# Patient Record
Sex: Female | Born: 1955
Health system: Southern US, Community
[De-identification: ages and names within clinical notes are randomized; demographics above are authoritative.]

## PROBLEM LIST (undated history)

## (undated) DIAGNOSIS — F419 Anxiety disorder, unspecified: Secondary | ICD-10-CM

## (undated) DIAGNOSIS — N12 Tubulo-interstitial nephritis, not specified as acute or chronic: Secondary | ICD-10-CM

## (undated) DIAGNOSIS — I509 Heart failure, unspecified: Secondary | ICD-10-CM

## (undated) DIAGNOSIS — R87619 Unspecified abnormal cytological findings in specimens from cervix uteri: Secondary | ICD-10-CM

## (undated) DIAGNOSIS — M5126 Other intervertebral disc displacement, lumbar region: Secondary | ICD-10-CM

## (undated) DIAGNOSIS — K219 Gastro-esophageal reflux disease without esophagitis: Secondary | ICD-10-CM

## (undated) DIAGNOSIS — I341 Nonrheumatic mitral (valve) prolapse: Secondary | ICD-10-CM

## (undated) DIAGNOSIS — M5136 Other intervertebral disc degeneration, lumbar region: Secondary | ICD-10-CM

## (undated) DIAGNOSIS — D649 Anemia, unspecified: Secondary | ICD-10-CM

## (undated) DIAGNOSIS — M4800 Spinal stenosis, site unspecified: Secondary | ICD-10-CM

## (undated) DIAGNOSIS — G4730 Sleep apnea, unspecified: Secondary | ICD-10-CM

## (undated) DIAGNOSIS — I10 Essential (primary) hypertension: Secondary | ICD-10-CM

## (undated) DIAGNOSIS — N289 Disorder of kidney and ureter, unspecified: Secondary | ICD-10-CM

## (undated) DIAGNOSIS — E739 Lactose intolerance, unspecified: Secondary | ICD-10-CM

## (undated) DIAGNOSIS — M51369 Other intervertebral disc degeneration, lumbar region without mention of lumbar back pain or lower extremity pain: Secondary | ICD-10-CM

## (undated) HISTORY — DX: Sleep apnea, unspecified: G47.30

## (undated) HISTORY — PX: CHOLECYSTECTOMY: SHX55

## (undated) HISTORY — DX: Anxiety disorder, unspecified: F41.9

## (undated) HISTORY — DX: Other intervertebral disc degeneration, lumbar region without mention of lumbar back pain or lower extremity pain: M51.369

## (undated) HISTORY — DX: Spinal stenosis, site unspecified: M48.00

## (undated) HISTORY — DX: Other intervertebral disc degeneration, lumbar region: M51.36

## (undated) HISTORY — PX: BREAST LUMPECTOMY: SHX2

## (undated) HISTORY — DX: Tubulo-interstitial nephritis, not specified as acute or chronic: N12

## (undated) HISTORY — DX: Anemia, unspecified: D64.9

## (undated) HISTORY — DX: Nonrheumatic mitral (valve) prolapse: I34.1

## (undated) HISTORY — DX: Lactose intolerance, unspecified: E73.9

## (undated) HISTORY — DX: Heart failure, unspecified: I50.9

## (undated) HISTORY — PX: DILATION AND CURETTAGE OF UTERUS: SHX78

## (undated) HISTORY — PX: BREAST BIOPSY: SHX20

## (undated) HISTORY — DX: Unspecified abnormal cytological findings in specimens from cervix uteri: R87.619

## (undated) HISTORY — DX: Gastro-esophageal reflux disease without esophagitis: K21.9

## (undated) HISTORY — PX: OTHER SURGICAL HISTORY: SHX169

## (undated) HISTORY — DX: Essential (primary) hypertension: I10

## (undated) HISTORY — DX: Other intervertebral disc displacement, lumbar region: M51.26

## (undated) HISTORY — PX: APPENDECTOMY: SHX54

---

## 1997-06-08 ENCOUNTER — Ambulatory Visit (HOSPITAL_COMMUNITY): Admission: RE | Admit: 1997-06-08 | Discharge: 1997-06-08 | Payer: Self-pay | Admitting: *Deleted

## 1998-04-25 ENCOUNTER — Ambulatory Visit (HOSPITAL_COMMUNITY): Admission: RE | Admit: 1998-04-25 | Discharge: 1998-04-25 | Payer: Self-pay | Admitting: *Deleted

## 1998-09-11 ENCOUNTER — Encounter (INDEPENDENT_AMBULATORY_CARE_PROVIDER_SITE_OTHER): Payer: Self-pay | Admitting: Specialist

## 1998-09-11 ENCOUNTER — Inpatient Hospital Stay (HOSPITAL_COMMUNITY): Admission: EM | Admit: 1998-09-11 | Discharge: 1998-09-13 | Payer: Self-pay | Admitting: Emergency Medicine

## 1998-11-26 ENCOUNTER — Encounter: Admission: RE | Admit: 1998-11-26 | Discharge: 1998-11-26 | Payer: Self-pay | Admitting: Gastroenterology

## 1998-11-26 ENCOUNTER — Encounter: Payer: Self-pay | Admitting: Gastroenterology

## 1999-02-27 ENCOUNTER — Encounter: Payer: Self-pay | Admitting: Emergency Medicine

## 1999-02-27 ENCOUNTER — Emergency Department (HOSPITAL_COMMUNITY): Admission: EM | Admit: 1999-02-27 | Discharge: 1999-02-27 | Payer: Self-pay | Admitting: Emergency Medicine

## 1999-03-06 ENCOUNTER — Encounter: Admission: RE | Admit: 1999-03-06 | Discharge: 1999-03-06 | Payer: Self-pay | Admitting: Gastroenterology

## 1999-03-06 ENCOUNTER — Encounter: Payer: Self-pay | Admitting: Gastroenterology

## 1999-03-07 ENCOUNTER — Ambulatory Visit (HOSPITAL_COMMUNITY): Admission: RE | Admit: 1999-03-07 | Discharge: 1999-03-07 | Payer: Self-pay | Admitting: Gastroenterology

## 1999-03-14 ENCOUNTER — Encounter: Payer: Self-pay | Admitting: Gastroenterology

## 1999-03-14 ENCOUNTER — Ambulatory Visit (HOSPITAL_COMMUNITY): Admission: RE | Admit: 1999-03-14 | Discharge: 1999-03-14 | Payer: Self-pay | Admitting: Gastroenterology

## 1999-03-17 ENCOUNTER — Other Ambulatory Visit: Admission: RE | Admit: 1999-03-17 | Discharge: 1999-03-17 | Payer: Self-pay | Admitting: *Deleted

## 1999-03-26 ENCOUNTER — Encounter: Payer: Self-pay | Admitting: *Deleted

## 1999-03-26 ENCOUNTER — Ambulatory Visit (HOSPITAL_COMMUNITY): Admission: RE | Admit: 1999-03-26 | Discharge: 1999-03-26 | Payer: Self-pay | Admitting: *Deleted

## 1999-04-25 ENCOUNTER — Encounter: Payer: Self-pay | Admitting: Internal Medicine

## 1999-04-25 ENCOUNTER — Ambulatory Visit (HOSPITAL_COMMUNITY): Admission: RE | Admit: 1999-04-25 | Discharge: 1999-04-25 | Payer: Self-pay | Admitting: Internal Medicine

## 1999-08-30 ENCOUNTER — Emergency Department (HOSPITAL_COMMUNITY): Admission: EM | Admit: 1999-08-30 | Discharge: 1999-08-30 | Payer: Self-pay | Admitting: Emergency Medicine

## 2000-04-23 ENCOUNTER — Other Ambulatory Visit: Admission: RE | Admit: 2000-04-23 | Discharge: 2000-04-23 | Payer: Self-pay | Admitting: *Deleted

## 2000-08-13 ENCOUNTER — Encounter: Payer: Self-pay | Admitting: *Deleted

## 2000-08-13 ENCOUNTER — Ambulatory Visit (HOSPITAL_COMMUNITY): Admission: RE | Admit: 2000-08-13 | Discharge: 2000-08-13 | Payer: Self-pay | Admitting: *Deleted

## 2001-09-27 ENCOUNTER — Inpatient Hospital Stay (HOSPITAL_COMMUNITY): Admission: EM | Admit: 2001-09-27 | Discharge: 2001-10-03 | Payer: Self-pay | Admitting: Emergency Medicine

## 2001-09-28 ENCOUNTER — Encounter: Payer: Self-pay | Admitting: Geriatric Medicine

## 2001-09-29 ENCOUNTER — Encounter: Payer: Self-pay | Admitting: Internal Medicine

## 2001-10-03 ENCOUNTER — Encounter: Payer: Self-pay | Admitting: Internal Medicine

## 2001-10-05 ENCOUNTER — Other Ambulatory Visit: Admission: RE | Admit: 2001-10-05 | Discharge: 2001-10-05 | Payer: Self-pay | Admitting: *Deleted

## 2001-10-06 ENCOUNTER — Encounter: Payer: Self-pay | Admitting: *Deleted

## 2001-10-06 ENCOUNTER — Ambulatory Visit (HOSPITAL_COMMUNITY): Admission: RE | Admit: 2001-10-06 | Discharge: 2001-10-06 | Payer: Self-pay | Admitting: *Deleted

## 2001-10-20 ENCOUNTER — Encounter: Payer: Self-pay | Admitting: General Surgery

## 2001-10-21 ENCOUNTER — Encounter: Payer: Self-pay | Admitting: General Surgery

## 2001-10-21 ENCOUNTER — Encounter (INDEPENDENT_AMBULATORY_CARE_PROVIDER_SITE_OTHER): Payer: Self-pay | Admitting: Specialist

## 2001-10-21 ENCOUNTER — Ambulatory Visit (HOSPITAL_COMMUNITY): Admission: RE | Admit: 2001-10-21 | Discharge: 2001-10-22 | Payer: Self-pay | Admitting: General Surgery

## 2002-04-28 ENCOUNTER — Encounter: Payer: Self-pay | Admitting: *Deleted

## 2002-04-28 ENCOUNTER — Ambulatory Visit (HOSPITAL_COMMUNITY): Admission: RE | Admit: 2002-04-28 | Discharge: 2002-04-28 | Payer: Self-pay | Admitting: *Deleted

## 2002-05-19 ENCOUNTER — Other Ambulatory Visit: Admission: RE | Admit: 2002-05-19 | Discharge: 2002-05-19 | Payer: Self-pay | Admitting: *Deleted

## 2002-08-07 ENCOUNTER — Ambulatory Visit (HOSPITAL_BASED_OUTPATIENT_CLINIC_OR_DEPARTMENT_OTHER): Admission: RE | Admit: 2002-08-07 | Discharge: 2002-08-07 | Payer: Self-pay | Admitting: Obstetrics and Gynecology

## 2002-10-27 ENCOUNTER — Emergency Department (HOSPITAL_COMMUNITY): Admission: EM | Admit: 2002-10-27 | Discharge: 2002-10-27 | Payer: Self-pay | Admitting: Emergency Medicine

## 2002-10-27 ENCOUNTER — Encounter: Payer: Self-pay | Admitting: Emergency Medicine

## 2002-10-30 ENCOUNTER — Ambulatory Visit (HOSPITAL_COMMUNITY): Admission: RE | Admit: 2002-10-30 | Discharge: 2002-10-31 | Payer: Self-pay | Admitting: Orthopedic Surgery

## 2003-10-25 ENCOUNTER — Ambulatory Visit (HOSPITAL_COMMUNITY): Admission: RE | Admit: 2003-10-25 | Discharge: 2003-10-25 | Payer: Self-pay | Admitting: *Deleted

## 2003-10-30 ENCOUNTER — Other Ambulatory Visit: Admission: RE | Admit: 2003-10-30 | Discharge: 2003-10-30 | Payer: Self-pay | Admitting: *Deleted

## 2003-12-11 ENCOUNTER — Ambulatory Visit (HOSPITAL_COMMUNITY): Admission: RE | Admit: 2003-12-11 | Discharge: 2003-12-11 | Payer: Self-pay | Admitting: Sports Medicine

## 2003-12-18 ENCOUNTER — Encounter: Admission: RE | Admit: 2003-12-18 | Discharge: 2004-01-25 | Payer: Self-pay | Admitting: Sports Medicine

## 2004-09-22 ENCOUNTER — Emergency Department (HOSPITAL_COMMUNITY): Admission: EM | Admit: 2004-09-22 | Discharge: 2004-09-22 | Payer: Self-pay | Admitting: Emergency Medicine

## 2004-11-14 ENCOUNTER — Other Ambulatory Visit: Admission: RE | Admit: 2004-11-14 | Discharge: 2004-11-14 | Payer: Self-pay | Admitting: Obstetrics and Gynecology

## 2005-12-01 ENCOUNTER — Ambulatory Visit (HOSPITAL_COMMUNITY): Admission: RE | Admit: 2005-12-01 | Discharge: 2005-12-01 | Payer: Self-pay | Admitting: Internal Medicine

## 2006-06-15 ENCOUNTER — Other Ambulatory Visit: Admission: RE | Admit: 2006-06-15 | Discharge: 2006-06-15 | Payer: Self-pay | Admitting: Obstetrics and Gynecology

## 2007-01-24 ENCOUNTER — Inpatient Hospital Stay (HOSPITAL_BASED_OUTPATIENT_CLINIC_OR_DEPARTMENT_OTHER): Admission: RE | Admit: 2007-01-24 | Discharge: 2007-01-24 | Payer: Self-pay | Admitting: Cardiology

## 2007-02-07 ENCOUNTER — Encounter (INDEPENDENT_AMBULATORY_CARE_PROVIDER_SITE_OTHER): Payer: Self-pay | Admitting: Cardiology

## 2007-02-07 ENCOUNTER — Ambulatory Visit: Payer: Self-pay | Admitting: Vascular Surgery

## 2007-02-07 ENCOUNTER — Ambulatory Visit (HOSPITAL_COMMUNITY): Admission: RE | Admit: 2007-02-07 | Discharge: 2007-02-07 | Payer: Self-pay | Admitting: Cardiology

## 2007-02-28 ENCOUNTER — Emergency Department (HOSPITAL_COMMUNITY): Admission: EM | Admit: 2007-02-28 | Discharge: 2007-02-28 | Payer: Self-pay | Admitting: Emergency Medicine

## 2007-06-05 ENCOUNTER — Emergency Department (HOSPITAL_COMMUNITY): Admission: EM | Admit: 2007-06-05 | Discharge: 2007-06-05 | Payer: Self-pay | Admitting: Family Medicine

## 2007-07-05 ENCOUNTER — Other Ambulatory Visit: Admission: RE | Admit: 2007-07-05 | Discharge: 2007-07-05 | Payer: Self-pay | Admitting: Obstetrics & Gynecology

## 2007-07-06 ENCOUNTER — Ambulatory Visit (HOSPITAL_COMMUNITY): Admission: RE | Admit: 2007-07-06 | Discharge: 2007-07-06 | Payer: Self-pay | Admitting: Obstetrics & Gynecology

## 2007-08-16 ENCOUNTER — Ambulatory Visit (HOSPITAL_COMMUNITY): Admission: RE | Admit: 2007-08-16 | Discharge: 2007-08-16 | Payer: Self-pay | Admitting: Gastroenterology

## 2007-08-16 ENCOUNTER — Encounter (INDEPENDENT_AMBULATORY_CARE_PROVIDER_SITE_OTHER): Payer: Self-pay | Admitting: Gastroenterology

## 2007-10-06 HISTORY — PX: OTHER SURGICAL HISTORY: SHX169

## 2007-10-14 ENCOUNTER — Inpatient Hospital Stay (HOSPITAL_COMMUNITY): Admission: EM | Admit: 2007-10-14 | Discharge: 2007-10-17 | Payer: Self-pay | Admitting: Emergency Medicine

## 2007-12-07 ENCOUNTER — Encounter: Admission: RE | Admit: 2007-12-07 | Discharge: 2008-01-02 | Payer: Self-pay | Admitting: Orthopaedic Surgery

## 2008-02-17 ENCOUNTER — Encounter: Admission: RE | Admit: 2008-02-17 | Discharge: 2008-04-09 | Payer: Self-pay | Admitting: Orthopaedic Surgery

## 2008-06-23 ENCOUNTER — Emergency Department (HOSPITAL_COMMUNITY): Admission: EM | Admit: 2008-06-23 | Discharge: 2008-06-23 | Payer: Self-pay | Admitting: Family Medicine

## 2008-08-03 ENCOUNTER — Ambulatory Visit (HOSPITAL_COMMUNITY): Admission: RE | Admit: 2008-08-03 | Discharge: 2008-08-03 | Payer: Self-pay | Admitting: Obstetrics & Gynecology

## 2008-10-05 HISTORY — PX: ANKLE SURGERY: SHX546

## 2009-03-14 ENCOUNTER — Ambulatory Visit: Payer: Self-pay | Admitting: Sports Medicine

## 2009-03-14 DIAGNOSIS — M79609 Pain in unspecified limb: Secondary | ICD-10-CM | POA: Insufficient documentation

## 2009-03-14 DIAGNOSIS — M25579 Pain in unspecified ankle and joints of unspecified foot: Secondary | ICD-10-CM | POA: Insufficient documentation

## 2009-03-15 DIAGNOSIS — R269 Unspecified abnormalities of gait and mobility: Secondary | ICD-10-CM | POA: Insufficient documentation

## 2009-09-24 ENCOUNTER — Ambulatory Visit (HOSPITAL_COMMUNITY): Admission: RE | Admit: 2009-09-24 | Discharge: 2009-09-24 | Payer: Self-pay | Admitting: Obstetrics & Gynecology

## 2010-02-01 ENCOUNTER — Emergency Department (HOSPITAL_COMMUNITY)
Admission: EM | Admit: 2010-02-01 | Discharge: 2010-02-01 | Payer: Self-pay | Source: Home / Self Care | Admitting: Emergency Medicine

## 2010-02-04 NOTE — Assessment & Plan Note (Signed)
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  History of Present Illness: Pt presents w/chronic b/l foot and ankle pain.  She has had surgery ont he right ankle after fracture and has also fractrued the left in the past.  She has been in orthotics for many years but is no longer getting relief from her old 3/4 length orthotics.  She is here today for evaluation orthotic consultation.  Allergies (verified): 1)  ! Axid 2)  ! Percocet 3)  ! Vicodin  Physical Exam  General:  Well-developed,well-nourished,in no acute distress; alert,appropriate and cooperative throughout examination Msk:  b/l bunions b/l transverse arch breakdown without metatarsal breakdown persistent effusion of the right ankle with surgical scar present Neurologic:  sensation intact Psych:  Cognition and judgment appear intact. Alert and cooperative with normal attention span and concentration   Impression & Recommendations:  Problem # 1:  FOOT PAIN, BILATERAL (ICD-729.5) Patient was fitted for a : standard, cushioned, semi-rigid orthotic. The orthotic was heated and afterward the patient stood on the orthotic blank positioned on the orthotic stand. The patient was positioned in subtalar neutral position and 10 degrees of ankle dorsiflexion in a weight bearing stance. After completion of molding, a stable base was applied to the orthotic blank. The blank was ground to a stable position for weight bearing. Size: 12 Base: large Posting: 1st ray post b/l Additional orthotic padding:  small 1/4 cm heel padding added to base of right   Problem # 2:  ANKLE PAIN, BILATERAL (ICD-719.47) likely associated with the foot problems, see orthotic prep above  Appended Document: NP,ORTHOTICS,[employer]  EMPLOYEE,[employer] Added charges. CRR  Gait Disturbance:  Treated with custom orthotics today to correct abnormal gait and to prevent further breakdown of transverse and long arches. CRR

## 2010-02-05 ENCOUNTER — Encounter (HOSPITAL_COMMUNITY)
Admission: RE | Admit: 2010-02-05 | Discharge: 2010-02-05 | Disposition: A | Discharge status: Home / Self Care | Payer: 59 | Source: Ambulatory Visit | Attending: Orthopaedic Surgery | Admitting: Orthopaedic Surgery

## 2010-02-05 ENCOUNTER — Ambulatory Visit (HOSPITAL_COMMUNITY)
Admission: RE | Admit: 2010-02-05 | Discharge: 2010-02-05 | Disposition: A | Discharge status: Home / Self Care | Payer: 59 | Attending: Orthopaedic Surgery | Admitting: Orthopaedic Surgery

## 2010-02-05 ENCOUNTER — Other Ambulatory Visit (HOSPITAL_COMMUNITY): Payer: Self-pay | Admitting: Orthopaedic Surgery

## 2010-02-05 DIAGNOSIS — M12579 Traumatic arthropathy, unspecified ankle and foot: Secondary | ICD-10-CM

## 2010-02-05 DIAGNOSIS — Z01810 Encounter for preprocedural cardiovascular examination: Secondary | ICD-10-CM | POA: Insufficient documentation

## 2010-02-05 DIAGNOSIS — Z01818 Encounter for other preprocedural examination: Secondary | ICD-10-CM | POA: Insufficient documentation

## 2010-02-05 DIAGNOSIS — M25579 Pain in unspecified ankle and joints of unspecified foot: Secondary | ICD-10-CM | POA: Insufficient documentation

## 2010-02-05 DIAGNOSIS — Z472 Encounter for removal of internal fixation device: Secondary | ICD-10-CM | POA: Insufficient documentation

## 2010-02-05 HISTORY — PX: ANKLE SURGERY: SHX546

## 2010-02-05 LAB — COMPREHENSIVE METABOLIC PANEL
AST: 25 U/L (ref 0–37)
BUN: 15 mg/dL (ref 6–23)
Calcium: 9.5 mg/dL (ref 8.4–10.5)
Chloride: 102 mEq/L (ref 96–112)
GFR calc non Af Amer: 51 mL/min — ABNORMAL LOW (ref >60)
Glucose, Bld: 87 mg/dL (ref 70–99)
Total Protein: 7.6 g/dL (ref 6.0–8.3)

## 2010-02-05 LAB — CBC
MCHC: 33.8 g/dL (ref 30.0–36.0)
RBC: 4.74 MIL/uL (ref 3.87–5.11)

## 2010-02-05 LAB — SURGICAL PCR SCREEN: Staphylococcus aureus: POSITIVE — AB

## 2010-02-05 LAB — DIFFERENTIAL
Basophils Relative: 1 % (ref 0–1)
Eosinophils Absolute: 0.3 10*3/uL (ref 0.0–0.7)
Eosinophils Relative: 3 % (ref 0–5)
Lymphocytes Relative: 27 % (ref 12–46)
Lymphs Abs: 2.9 10*3/uL (ref 0.7–4.0)

## 2010-02-18 NOTE — Op Note (Signed)
NAMEJELANI, [name]            ACCOUNT NO.:  [id number]  MEDICAL RECORD NO.:  [id number]           PATIENT TYPE:  O  LOCATION:  XRAY                         FACILITY:  [facility]  PHYSICIAN:  Bonnie Shimer C. Ophelia Sampson, M.D.    DATE OF BIRTH:  1955/10/09  DATE OF PROCEDURE:  02/05/2010 DATE OF DISCHARGE:                              OPERATIVE REPORT   PREOPERATIVE DIAGNOSIS:  Right ankle pain status post right ankle bimalleolar fracture with lateral peroneal tendon pain.  POSTOPERATIVE DIAGNOSIS:  Right ankle pain status post right ankle bimalleolar fracture with lateral peroneal tendon pain.  PROCEDURE:  Diagnostic operative arthroscopy right ankle, partial synovectomy, and removal of medial and lateral malleolar hardware.  SURGEON:  Bonnie Heist C. Ophelia Charter, MD  ANESTHESIA:  GOT plus Marcaine local.  TOURNIQUET TIME:  34 minutes.  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  She underwent ORIF of the ankle, which has healed. She has had persistent pain in the ankle with some swelling and pain over the lateral fibula and peroneal tendon adjacent to the plate, which was directly lateral.  PROCEDURE:  After adequate induction of general anesthesia and orotracheal intubation, calf leg holder, preoperative 2 g Ancef, standard prep and drape was performed.  An ankle scope was introduced with stab incision, spreading with mosquito infiltration into the joint for insufflation and then poking through the joint with blunt trocar. Synovectomy was performed anteriorly for visualization.  Cartilage surface looked normal.  There were some areas that were roughened up slightly on the anterior aspect of the tibia an, no corresponding significant spur anteriorly causing anterior ankle impingement. Anterolaterally, there were some localized synovitis, which was debrided.  Care was taken not to remove  the anterior talofibular ligament portion of the capsule.  Medial and lateral gutters were clear of loose bodies.  Probe was used for careful probing.  No loose pieces were noted and the scope was able to visualize fairly deep into the joint posteriorly.  After thorough irrigation, nylon sutures were placed in the portals.  Tourniquet was inflated, lateral incision was made, and fibular plate screws were removed including the anterior to posterior to proximal and distal interfrag screw.  Fracture was healed and stable. Wound was irrigated and then small incision opening half the old medial incision was made and two medial malleolar screws were removed with some difficulty since one of them had some bone overgrowth over the screw head.  This was irrigated and closed, saphenous vein was preserved. Inspection laterally over the peroneal tendon flexion/extension showed no evidence of peroneal subluxation of the tip.  There were some scar tissue around the peroneal tendon, but it was difficult to determine if there was any tendinopathy since the patient had previous trauma.  Scar tissue was present in the area from the scar tissue form of fracture hematoma post surgery of the fibula, etc.  After irrigation, 2-0 Vicryl was used for reapproximation skin closure in all areas and then postop dressing and a soft dressing.  The patient was transferred to the recovery room  in a stable condition.  Instrument count and needle count was correct.     [name], M.D.     MCY/MEDQ  D:  02/05/2010  T:  02/06/2010  Job:  [id number]  Electronically Signed by Annell Greening M.D. on 02/18/2010 04:56:29 PM

## 2010-02-20 ENCOUNTER — Ambulatory Visit: Payer: 59 | Attending: Orthopaedic Surgery | Admitting: Physical Therapy

## 2010-02-20 DIAGNOSIS — M25569 Pain in unspecified knee: Secondary | ICD-10-CM | POA: Insufficient documentation

## 2010-02-20 DIAGNOSIS — M2560 Stiffness of unspecified joint, not elsewhere classified: Secondary | ICD-10-CM | POA: Insufficient documentation

## 2010-02-20 DIAGNOSIS — IMO0001 Reserved for inherently not codable concepts without codable children: Secondary | ICD-10-CM | POA: Insufficient documentation

## 2010-02-20 DIAGNOSIS — R262 Difficulty in walking, not elsewhere classified: Secondary | ICD-10-CM | POA: Insufficient documentation

## 2010-02-24 ENCOUNTER — Encounter: Payer: 59 | Admitting: Physical Therapy

## 2010-02-25 ENCOUNTER — Ambulatory Visit: Payer: 59 | Admitting: Physical Therapy

## 2010-02-27 ENCOUNTER — Ambulatory Visit: Payer: 59 | Admitting: Physical Therapy

## 2010-03-03 ENCOUNTER — Ambulatory Visit: Payer: 59 | Admitting: Physical Therapy

## 2010-03-06 ENCOUNTER — Ambulatory Visit: Payer: 59 | Attending: Internal Medicine | Admitting: Physical Therapy

## 2010-03-06 DIAGNOSIS — R262 Difficulty in walking, not elsewhere classified: Secondary | ICD-10-CM | POA: Insufficient documentation

## 2010-03-06 DIAGNOSIS — M25569 Pain in unspecified knee: Secondary | ICD-10-CM | POA: Insufficient documentation

## 2010-03-06 DIAGNOSIS — IMO0001 Reserved for inherently not codable concepts without codable children: Secondary | ICD-10-CM | POA: Insufficient documentation

## 2010-03-06 DIAGNOSIS — M2560 Stiffness of unspecified joint, not elsewhere classified: Secondary | ICD-10-CM | POA: Insufficient documentation

## 2010-03-17 ENCOUNTER — Ambulatory Visit: Payer: 59 | Admitting: Physical Therapy

## 2010-03-19 ENCOUNTER — Ambulatory Visit: Payer: 59 | Admitting: Physical Therapy

## 2010-05-20 NOTE — Op Note (Signed)
[name], [name]            ACCOUNT NO.:  [id number]   MEDICAL RECORD NO.:  [id number]          PATIENT TYPE:  AMB   LOCATION:  ENDO                         [facility]ACILITY:  [facility]   PHYSICIAN:  James L. Malon Kindle., M.D.DATE OF BIRTH:  14-Jan-1955   DATE OF PROCEDURE:  08/16/2007  DATE OF DISCHARGE:                               OPERATIVE REPORT   PROCEDURE:  Esophagogastroduodenoscopy with biopsy.   MEDICATIONS:  Fentanyl 75 mcg, Versed 5 mg and Hurricaine spray.   INDICATIONS:  Persistent gastroesophageal reflux.   DESCRIPTION OF PROCEDURE:  The procedure had been explained to the  patient and consent obtained.  In the left lateral decubitus position  the Pentax upper scope was inserted and advanced.  The esophagus was  entered.  The distal esophagus was somewhat reddened, consistent with  esophageal reflux.  There was no ulceration, erosions or Barrett's  esophagus.  The stomach was entered, the pylorus identified and passed.  The duodenal bulb and second portion were seen well.  The antrum and  body of the stomach were normal,  then a 0.5-cm polyp in the proximal  body of the stomach was biopsied.  The fundus and cardia were seen well  on the retroflex view and were normal.  The scope was withdrawn and the  initial findings on entry were confirmed.   ASSESSMENT:  1. Esophageal reflux manifested by a somewhat reddened esophagus with      no signs of Barrett's esophagus.  2. Small gastric polyp biopsied, suspect fundic gland polyp.   PLAN:  1. Will continue on antireflux measures and continue on Nexium      indefinitely.  2. Will proceed with colonoscopy today.           ______________________________  Llana Aliment. Malon Kindle., M.D.     Waldron Session  D:  08/16/2007  T:  08/16/2007  Job:  [id number]   cc:   Candyce Churn, M.D.  Fax: [phone]   Leda Quail, MD   Llana Aliment. Malon Kindle., M.D.  Fax: [phone]

## 2010-05-20 NOTE — Op Note (Signed)
[name], [name]            ACCOUNT NO.:  [id number]   MEDICAL RECORD NO.:  [id number]          PATIENT TYPE:  INP   LOCATION:  5020                         FACILITY:  [facility]   PHYSICIAN:  Mark C. Ophelia Charter, M.D.    DATE OF BIRTH:  August 19, 1955   DATE OF PROCEDURE:  10/15/2007  DATE OF DISCHARGE:                               OPERATIVE REPORT   PREOPERATIVE DIAGNOSIS:  Right lateral malleolar ankle fracture  dislocation.  Left fibular fracture.   POSTOPERATIVE DIAGNOSIS:  Right lateral malleolar ankle fracture  dislocation.  Left fibular fracture.   PROCEDURE:  Open reduction and internal fixation right medial lateral  malleolar fracture.  Casting left lateral malleolar fracture.   SURGEON:  Mark C. Ophelia Charter, MD   TOURNIQUET:  Right lower extremity, 38 minutes x 350.   COMPONENTS USED:  Seven hole one-third tubular plate and two 4.0 medial  malleolar lag screws.   ANESTHESIA:  GOT plus Marcaine skin local 15 mL.   PROCEDURE:  After induction of general anesthesia, proximal tourniquet  application on the right, surgical checklist time-out procedure was  completed.  Leg was elevated during prepping and draping, tourniquet was  inflated.  Sterile skin marker was used.  Large glove was placed to  cover the toes.  Incision was made medially C-shaped and subperiosteal  dissection of the fracture.  There was no periosteum or posterior tib in  the fracture site and the medial malleolar fracture was little bit  smaller than normal and oblique.  The fibular fracture was extremely  comminuted and had 2 butterfly fragments and 7-hole one-third tubular  plate was selected.  Distal screw was placed cancellus and butterfly  fragments were reduced, clamped, and proximal bicortical 12-mm screws  were placed.  The inner frag screws placed between 2 large butterfly  fragments lagging anterior proximal to posterior distal 22-mm cortical.  AP and fluoroscopic pictures were checked including mortise  views  showing good reduction.  There were 2 empty screw holes in the 7-hole  one-third tubular plate, however, they went directly in the areas of  comminution.  After irrigation with saline solution, subcutaneous tissue  was reapproximated with 2-0 and skin staple was used for lateral  incision.  Medial malleolus was reduced, anatomically K-wire was drilled  across and then one single 40-mm lag screw cancellus partially threaded  was inserted followed by second in the position where the K-wire had  been placed.  Fluoroscopic pictures confirmed excellent position and  alignment.  After irrigation, 2-0 Vicryl in subcutaneous tissue, skin  staple closure for the medial incision.  Marcaine infiltration 15 mL  both incisions and also some in the fracture site.  Adaptic, 4x4s,  Webril, short-leg splint was applied.  Next, fiberglass short-leg cast  was  applied on the left side for the nondisplaced fibular fracture to  stabilize it since this will be the patient's better ankle and she will  be nonweightbearing due to the comminution of the right ankle fracture.  The patient tolerated the procedure well and was transferred to recovery  room.      Mark C. Ophelia Charter, M.D.  Electronically Signed     MCY/MEDQ  D:  10/15/2007  T:  10/15/2007  Job:  [id number]

## 2010-05-20 NOTE — Cardiovascular Report (Signed)
[name], [name]            ACCOUNT NO.:  [id number]   MEDICAL RECORD NO.:  [id number]          PATIENT TYPE:  OIB   LOCATION:  NA                           FACILITY:  [facility]   PHYSICIAN:  Jake Bathe, MD      DATE OF BIRTH:  August 26, 1955   DATE OF PROCEDURE:  01/24/2007  DATE OF DISCHARGE:                            CARDIAC CATHETERIZATION   PRIMARY CARE PHYSICIAN:  Candyce Churn, M.D.   INDICATIONS:  A 55 year old female with a strong early family history of  coronary artery disease with hypertension and symptoms concerning for  stable exertional angina.  Her mother had her first myocardial  infarction in her 7s.   PROCEDURES:  1. Left heart catheterization.  2. Left ventriculogram.  3. Selective coronary angiography.  4. Abdominal aortogram.   PROCEDURE DETAILS:  Informed consent was obtained.  Risks of stroke,  heart attack, and death were explained to the patient.  She was placed  on the catheterization table and prepped in a sterile fashion and then  1% lidocaine was used to infiltrate the right groin.  Prior to procedure  given her IV dye allergy, 3 doses of prednisone 60 mg as well as 2 doses  of Benadryl 25 mg were administrated.  Just prior to the case, 16 mg of  Solu-Medrol was administered IV.  Using the modified Seldinger  technique, a 4-French sheath was placed in the right femoral artery  after fluoroscopic visualization of the femoral head.  A Judkins left #4  catheter was then selectively cannulated in the left main artery and  multiple views with Omnipaque were obtained with hand injection.  This  catheter was then exchanged for a No Torque Williams right catheter,  which was selectively cannulated into the right coronary artery at 1  view, given its non-dominance, was obtained.  This catheter was  exchanged for an angled pigtail, which was used to cross the aortic  valve into the left ventricle.  Hemodynamics was obtained.  A left  ventriculogram in the RAO position with power injection was used with a  total of 25 mL of dye at 15 mL per second.  Following this shot, the  pigtail was pulled back across the aortic valve.  Hemodynamics were  obtained.  The pigtail was then brought to the level of L5 spinal body,  and abdominal aortogram in the AP position was obtained with power  injection of 40 mL of dye at 16 mL per second.  Following the procedure,  the catheter was removed.  The sheath was removed.  Manual compression  held.  The patient was hemodynamically stable.  No evidence of hematoma.  The patient tolerated the procedure well.  No evidence of hives or any  other allergic reaction.   FINDINGS:  1. Left main artery - No disease.  Short.  2. Left anterior descending artery - Two diagonal branches, first of      which has a very proximal takeoff.  There was no significant      disease within these vessels.  The LAD wraps around the apex and  continues in the intraventricular groove to the mid to basal      portion of the inferior wall.  Large vessel.  3. Left circumflex artery - Dominant vessel (actually codominant with      the LAD).  There were 3 obtuse marginal branches.  No significant      disease.  4. Right coronary artery - Small nondominant vessel.  5. Left ventriculogram - Normal ejection fraction estimated at 65%      with no regional wall motion abnormalities.  No significant MR or      mitral valve prolapse observed.  6. Hemodynamics - Left ventricle systolic pressure 145 with a left      ventricular end-diastolic pressure of 8 mmHg.  Aortic systolic      pressure 145 with a diastolic of 90 and a mean of 110 mmHg.  There      was no significant gradient between the left ventricle and aortic      root.  7. Abdominal aortogram - Bilateral renal arteries appear widely patent      with no significant disease.  There was no observable calcification      or plaquing in the abdominal aorta.    IMPRESSION:  1. No angiographically significant coronary artery disease.  2. Normal left ventricular ejection fraction estimated at 65% with no      regional wall motion abnormalities.  No observable mitral      regurgitation or mitral valve prolapse.  3. Normal-appearing renal arteries with no significant plaquing.      Abdominal aorta also with no significant plaquing or calcification.   RECOMMENDATIONS:  We will continue aggressive medical management,  especially with hypertension.  May need to add a titration of  medications.  Would pursue a noncardiac chest pain workup.  Last LDL was  130, HDL 40.      Jake Bathe, MD  Electronically Signed     MCS/MEDQ  D:  01/24/2007  T:  01/24/2007  Job:  [id number]   cc:   Candyce Churn, M.D.

## 2010-05-20 NOTE — Op Note (Signed)
[name], [name]            ACCOUNT NO.:  [id number]   MEDICAL RECORD NO.:  [id number]          PATIENT TYPE:  AMB   LOCATION:  ENDO                         FACILITY:  [facility]   PHYSICIAN:  James L. Malon Kindle., M.D.DATE OF BIRTH:  1955-06-14   DATE OF PROCEDURE:  08/16/2007  DATE OF DISCHARGE:                               OPERATIVE REPORT   PROCEDURE:  Colonoscopy.   MEDICATIONS:  The patient received a total of 125 mcg of Fentanyl and 7  mg of Versed for both procedures.   SCOPE:  Pentax pediatric colonoscope.   INDICATIONS FOR PROCEDURE:  Colon cancer screening.   DESCRIPTION OF PROCEDURE:  Following endoscopy, the patient was turned  around and digital examination was performed.  The Pentax pediatric  colonoscope was inserted and advanced.  The prep was excellent.  Using  some abdominal pressure, we reached the cecum.  The ileocecal valve and  appendiceal orifice identified.  The scope was withdrawn.  The cecum,  ascending, transverse, descending, and sigmoid colon was seen well.  No  diverticulosis.  No polyps.  No masses or other gross abnormalities.  The rectum was free of polyps.  Internal hemorrhoids were seen in the  rectum on retroflexed view.  The scope was withdrawn and the patient  tolerated the procedure well.   ASSESSMENT:  Normal screening colonoscopy other than the presence of  internal hemorrhoids.   PLAN:  I will recommend resuming stool hemoccults in 3 years and  repeating colonoscopy in 10 years.           ______________________________  Llana Aliment Malon Kindle., M.D.     Waldron Session  D:  08/16/2007  T:  08/16/2007  Job:  [id number]   cc:   Candyce Churn, M.D.  Fax: [phone]   M. Leda Quail, MD  Fax: [phone]

## 2010-05-23 NOTE — Discharge Summary (Signed)
[name], [name]                      ACCOUNT NO.:  [id number]   MEDICAL RECORD NO.:  [id number]                   PATIENT TYPE:  INP   LOCATION:  5743                                 FACILITY:  [facility]   PHYSICIAN:  Candyce Churn, MD            DATE OF BIRTH:  1955-07-05   DATE OF ADMISSION:  09/27/2001  DATE OF DISCHARGE:  10/03/2001                                 DISCHARGE SUMMARY   DISCHARGE DIAGNOSES:  1. Probable resolving pyelonephritis.  2. Abdominal pain with questionable etiology.  3. Iron deficiency anemia.  4. Gastroesophageal reflux disease.  5. Allergic rhinitis.  6. Chronic urticaria.  7. Abnormal  cervical changes noted on pelvic CT scan,  plans to be followed     up by Dr. Cheryle Horsfall.  8. Low grade fever and nausea while hospitalized, question secondary to     resolving pyelonephritis, improved on discharge.  9. History of lactose intolerance.  10.      History of positive antinuclear antibody.  11.      Transaminase, improved, question etiology.   DISCHARGE MEDICATIONS:  1. Zyrtec 10 mg  p.o. q.d.  2. DiaBeta HCT 1 cc.  3. Zestril 0.5 1 q.d.  4. Toprol XL 50 mg q.d.  5. Nexium 40 mg q.d.  6. ____________  100 mg p.o. b.i.d.  7. Levaquin 500 mg q.d. x 7 days.   CONSULTS:  1. Dr. Violeta Gelinas, surgery on September 30, 2001.  2. Dr. Dorena Cookey, gastroenterology on October 01, 2001.   HOSPITAL COURSE:  PROBLEM #1, FEVER, FLANK PAIN, PROBABLE PARTIALLY TREATED  PYELONEPHRITIS:  The patient presented to [facility] at on  September 27, 2001, complaining of fever and flank  pain and right CVA  tenderness, which is persistent after three days of ciprofloxacin for UTI.  She has been seen in the [place] walk-in clinic several days previous and was  found to have fever and an abnormal urinalysis. Unfortunately, her culture  from that clinic visit was possibly not processed and blood culture also was  not obtained. After taking  ciprofloxacin 500 mg b.i.d. and Pyridium t.i.d.  for two to three days, she presented complaining of worse CVA tenderness,  flank pain,  fever and  nausea and was admitted for IV fluids and IV  antibiotics.   Initial urinalysis on admission obtained on September 28, 2001, had large  hemoglobin, 3 to 6 white blood cells, 3 to 6 red cells and only rare  bacteria, apparently markedly different from a urinalysis obtained from the  [facility] walk-in clinic. Subsequent urine culture from September 28, 2001, also  revealed no growth. The patient was  treated with intravenous Rocephin on  admission, thinking that fluoroquinolones were causing nausea and vomiting.  Her white count initially was in the 12,000 range then it came down to  83,000 by September 29, 2001. Intravenous Rocephin was continued through  September 30, 2001, and then  discontinued because of persistent nausea and  vomiting.   By September 30, 2001, white count had stabilized at 9000, however, off  Rocephin. By October 03, 2001, white count had risen back from a minimum  of 8800 to 12,600 and it was elected to restart antibiotics in the form of  Levaquin 500 mg q.d. for 7 days to complete a 2 week course of antibiotics  for presumed pyelonephritis.   PROBLEM #2, PERSISTENT NAUSEA AND VOMITING AFTER ADMISSION: This was never  completely understood. The patient was having  some right upper quadrant  pain after admission, and an abdominal ultrasound revealed some bilateral  suprarenal cysts but was otherwise normal. A biliary scan performed on  September 29, 2001, showed a reduced ejection fraction of the gallbladder  20% to 25%, otherwise within normal limits.   Dr. Dorena Cookey was consulted on October 01, 2001, and thought that the low  grade fever and abdominal pain and the vomiting without elevation of LFTs  could possibly be secondary to viral or autoimmune hepatitis or possibly  inflammatory bowel disease, or from biliary  disease. An antibody screen for  hepatitis B and C were negative with a negative hepatitis B surface antigen  and negative hepatitis C antibody.  Smooth muscle antibody titers were  negative with less than 1 to 20,  suggesting a significant liver disease.   Of interest is that the patient did start to feel better, however, with  discontinuation of Rocephin, but her white count started to rise again. By  discharge on October 03, 2001. The patient was  feeling much improved and  was discharged home on Levaquin 500 mg for one more week. It was not fully  realized whether or not the nausea and vomiting and low grade fevers may  have been secondary to her medication or were part and parcel of her  pyelonephritis.  It was felt that she indeed have biliary dyskinesia and  that cholecystectomy might be indicated in her future. She wanted to work  with  Dr. Claud Kelp in terms of possible surgery.   An abdominal and pelvic CT scan was obtained by Dr. Madilyn Fireman on October 03, 2001, because of  her unexplained abdominal pain the mild low grade fever  and liver function tests and what was found was diffuse hepatocellular  disease just mild fat infiltration, bilateral  renal cysts, and chronic  pyelonephritic changes of the right kidney. This is likely secondary to  reflux nephropathy.   A pelvic scan revealed mild iliac adenopathy and rather striking changes of  the cervix with the radiologist thinking that it might be cervical carcinoma  with local invasion of surrounding structures, including the rectosigmoid  and potentially the bladder. Without a tissue diagnosis which certainly  continued to be suspect until proven otherwise. Copies of the CT scan were  to be forwarded to Dr. Cheryle Horsfall, also who is her gynecologist. Also  noted was a 2 cm hemorrhagic cyst on the left ovary.   An addendum was dictated to the abdominal and pelvic CT scan result when a subsequent MRI of the pelvis was  done on October 06, 2001. This was after the  hospital discharge. The cervix was thought to have a foreign body appearance  on the CT scan, but there was no evidence of cervical mass shown by MRI. A  cervical Nabothian cyst was seen measuring approximately 1 cm with stranding  noted in the perirectal region. The soft tissue density noted along  the left  iliac chain, although suspicious for adenopathy, was actually found to be a  left ovary with a 2 cm hemorrhagic cyst.  Of note the pelvic lymph nodes  were considered to be clinically significantly enlarged.   Again, at the time of discharge the patient was feeling improved, but the  reason  for the persistent nausea and abdominal pain was unclear. It could  have been both medication and infection related.  She was feeling much improved on discharge on October 03, 2001.                                                 Candyce Churn, MD    RNG/MEDQ  D:  11/01/2001  T:  11/02/2001  Job:  [id number]

## 2010-05-23 NOTE — Op Note (Signed)
[name], [name]                      ACCOUNT NO.:  [id number]   MEDICAL RECORD NO.:  [id number]                   PATIENT TYPE:  OIB   LOCATION:  5712                                 FACILITY:  [facility]   PHYSICIAN:  Angelia Mould. Derrell Lolling, M.D.             DATE OF BIRTH:  1955-11-10   DATE OF PROCEDURE:  10/21/2001  DATE OF DISCHARGE:  10/22/2001                                 OPERATIVE REPORT   PREOPERATIVE DIAGNOSIS:  Biliary dyskinesia.   POSTOPERATIVE DIAGNOSIS:  Biliary dyskinesia.   PROCEDURE:  Laparoscopic cholecystectomy with intraoperative cholangiogram.   SURGEON:  Angelia Mould. Derrell Lolling, M.D.   ASSISTANT:  Sandria Bales. Ezzard Standing, M.D.   OPERATIVE INDICATION:  This is a 55 year old black female who has had  problems with fatty food intolerance, postprandial abdominal bloating, right  upper quadrant epigastric pain, and nausea.  She has been worked up on more  than one occasion.  Gallbladder ultrasounds always show sludge.  Hepatobiliary scan recently showed a delayed ejection fraction of about 25%,  consistent with biliary kinesia.  She has had some mild elevation of her  liver function tests.  She has had an extensive GI workup over the past  couple of years.  Nothing else has been found other than a small hiatal  hernia.  She is brought to the operating room electively for  cholecystectomy.   OPERATIVE FINDINGS:  The gallbladder was fairly normal in appearance,  perhaps slightly thick-walled.  The cholangiogram was normal.  There were no  filling defects, the intrahepatic and extrahepatic biliary anatomy was  normal, and there was prompt flow of contrast into the duodenum.  The liver  was healthy.  The stomach and duodenum, small intestine and large intestine  were healthy.  The pelvis was visualized carefully, and everything looked  benign.  She had a cyst on her left ovary.  The right ovary looked normal.  The uterus was slightly enlarged.  The cul-de-sac looked normal.   The  sigmoid colon looked normal.   DESCRIPTION OF PROCEDURE:  Following the induction of general endotracheal  anesthesia, the patient's abdomen was prepped and draped in a sterile  fashion.  A transverse incision was made at the lower rim of the umbilicus,  excising the previous laparoscopy scar.  The fascia was incised in the  midline and the abdominal cavity entered under direct vision.  A 10 mm  Hasson trocar was inserted and secured with a pursestring suture of 0  Vicryl.  Pneumoperitoneum was created.  The video camera was inserted with  visualization and findings as described above.  A 10 mm trocar was placed in  the subxiphoid region and two 5 mm trocars placed in the right midabdomen.   The patient was placed in Trendelenburg position, and we carefully examined  the pelvis because of previous gynecologic problems.  She did not have any  significant pathologic findings, only a cyst on the left ovary  and a  slightly enlarged uterus.   The patient was then positioned with reverse Trendelenburg position and  tilted to the left.  We elevated the gallbladder and exposed the  gallbladder.  We dissected out the cystic duct and the cystic artery.  The  cystic artery was controlled with metal clips and divided as it went onto  the gallbladder wall.  The cystic duct was secured with a metal clip close  to the gallbladder.  The cystic duct was opened and a cholangiogram catheter  was inserted.  Cholangiogram was obtained using the C-arm.  The  cholangiogram was normal.  There was prompt emptying of contrast into the  duodenum, normal intrahepatic and extrahepatic biliary anatomy, and no  filling defects.  The cholangiogram catheter was removed.  The cystic duct  was secured with metal clips and divided.  The gallbladder was dissected  from its bed with electrocautery and removed through the umbilical port.  The operative field was irrigated.  There was absolutely no bleeding and no   bile leak whatsoever.  Trocars were removed under direct vision, and there  was no bleeding from the trocar sites.  The pneumoperitoneum was released.  The fascia at the umbilicus was closed with 0 Vicryl sutures.  The skin  incisions were closed with subcuticular sutures of 4-0 Vicryl and Steri-  Strips.  Clean bandages were placed and the patient taken to the recovery  room in stable condition.  Estimated blood loss was about 10 cc.  Complications:  None.  Sponge, needle, and instrument counts were correct.                                                Angelia Mould. Derrell Lolling, M.D.    HMI/MEDQ  D:  10/21/2001  T:  10/23/2001  Job:  [id number]   cc:   Candyce Churn, MD  [address] [place]  [place] [address]  Fax: [phone]   Andres Ege, M.D.  [address]  [place]  [place] [address]  Fax: [phone]

## 2010-05-23 NOTE — Op Note (Signed)
NAME:  [name]                      ACCOUNT NO.:  [id number]   MEDICAL RECORD NO.:  [id number]                   PATIENT TYPE:  OIB   LOCATION:  5715                                 FACILITY:  [facility]   PHYSICIAN:  Katy Fitch. Naaman Plummer., M.D.          DATE OF BIRTH:  [birth year]   DATE OF PROCEDURE:  10/30/2002  DATE OF DISCHARGE:                                 OPERATIVE REPORT   PREOPERATIVE DIAGNOSIS:  Comminuted articular fracture of the left distal  radius, impacted and dorsally angulated.   POSTOPERATIVE DIAGNOSIS:  Comminuted articular fracture of the left distal  radius, impacted and dorsally angulated.   PROCEDURE:  Open reduction and internal fixation of left distal radius.   SURGEON:  Katy Fitch. Sypher, M.D.   ASSISTANT:  [name], P.A.-C.   ANESTHESIA:  General by LMA, supervised by anesthesiologist, [name], M.D.   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   She fell onto an outstretched left arm sustaining an acute injury to the  left upper extremity.  She was transported to the [facility] Emergency Room where she was noted to have a comminuted impacted  fracture of her left distal radius.  An upper extremity orthopedic consult  was requested.   Clinical examination confirmed a silver fork deformity of her left wrist.  X-  rays revealed an impacted comminuted articular fracture with a significantly  displaced lunate facet fracture fragment.   Due to the fact that she had a full stomach we could not proceed with urgent  surgery at that time.   Rather in an effort to contain her costs and to provide optimal care we made  arrangements for elective ORIF of her fracture at [facility] utilizing a  DVR volar plate system.   Her surgery was scheduled for  Monday, October 30, 2002, on an elective  basis.   DESCRIPTION OF PROCEDURE:  After informed consent she is brought to the  operating room at this time.   [name] is brought to the operating room and placed in the supine  position on the operating room table.   Following induction of general orotracheal anesthesia, she was positioned  with her left arm on the arm table.  A pneumatic tourniquet was placed on  the proximal brachium.   After routine Betadine scrub and paint, the arm was draped with stockinette  and impervious arthroscopy drapes.  Following exsanguination of the left arm  with an Esmarch bandage and arterial tourniquet, the proximal brachium was  inflated to 230 mmHg.  The procedure commenced with a DVR volar incision  that had a Bruner's zigzag at the proximal wrist flexion crease.  The  incision paralleled the flexor carpal radialis tendon.  The subcutaneous  tissues were  carefully divided taking care to electrocauterize transverse  veins.  The flexor carpal radialis tendon sheath was incised longitudinally  and the tendon retracted in an ulnar direction.  The radial artery and its  accompanying veins retracted radially.  The flexor pollicis longus was  retracted in an ulnar direction and the pronator quadratus was taken sharply  and elevated with a periosteal elevator exposing the fracture site and  distal radial metaphysis.   A seven-hole DVT plate system was selected as the appropriate size for her  radius.  The fracture was reduced directly with three-point molding with  direct manipulation of the fracture fragments.  An anatomic reduction was  achieved with restoration of slope, length and tilt.  The seven-hold DVR  plate system was then placed through standard technique utilizing a sliding  cortical screw to adjust the length followed by placement of the seven pegs  and threaded screws using C-arm fluoroscopic control to be certain that both  the  fracture fragments remain anatomic and the pegs were in the appropriate  position.   The three remaining cortical screws were placed without difficulty.   AP, lateral and 30 degree supination obliques demonstrated anatomic  reduction and excellent position of the hardware.   The wound was then lavaged with sterile saline followed by triple antibiotic  solution.  The pronator quadratus was repaired with a running 2-0 Vicryl  suture followed by repair of the skin with subdermal sutures of 4-0 Vicryl  and intradermal 3-0 Prolene.   The wound was dressed with Steri-Strips, sterile gauze, sterile Kerlix,  sterile Webril and a sugar tong splint maintaining the forearm in full  supination.  There were no apparent complications.   [name] tolerated the surgery and anesthesia well.  She was transferred  to the recovery room with stable vital signs.   Note that this is a [facility] patient, treated under the Upper  Extremity Trauma Protocol.                                                 Katy Fitch Naaman Plummer., M.D.    RVS/MEDQ  D:  10/30/2002  T:  10/31/2002  Job:  [id number]

## 2010-05-23 NOTE — H&P (Signed)
[name], [name]                      ACCOUNT NO.:  [id number]   MEDICAL RECORD NO.:  [id number]                   PATIENT TYPE:  INP   LOCATION:  5743                                 FACILITY:  [facility]   PHYSICIAN:  Lavinia Sharps, N.P.               DATE OF BIRTH:  October 23, 1955   DATE OF ADMISSION:  09/27/2001  DATE OF DISCHARGE:                                HISTORY & PHYSICAL   CHIEF COMPLAINT:  1. Fever.  2. Right flank pain and CVA tenderness on the third day of Cipro for UTI.   HISTORY OF PRESENT ILLNESS:  The patient was seen in the walk-in clinic on  Sunday with a fever and  abnormal urine. She was given Cipro 500 mg b.i.d.  and Pyridium t.i.d. Now she feels worse with CVA tenderness, flank pain,  fever and nausea. She has a history of pyelonephritis. She came in for  further evaluation.   PAST MEDICAL HISTORY:  1. Heavy  periods, seen by Dr. Cheryle Horsfall.  2. Iron deficiency anemia related to heavy periods.  3. Allergic rhinitis. She is on Zyrtec.  4. Gastroesophageal reflux disease, on Nexium.  5. A  barium swallow and upper GI in 1994.  6. Pyelonephritis during pregnancy.  7. Mitral valve prolapse, uses clindamycin for dental procedures.  8. Lactose intolerant.  9. Positive ANA.  10.      Elevated liver functions that are now resolved.   ALLERGIES:  DRUG ALLERGIES:  1. INTRAVENOUS PYELOGRAM DYE.  2. AXID CAUSES ANAPHYLAXIS.  3. OXYCODONE AND CODEINE.   MEDICATIONS:  1. Toprol XL 50 mg 1 q.d.  2. Diovan 160/12.5 1 q.d.  3. Zyrtec 10 mg 1 q.h.s.  4. Nexium 40 mg 1 q.d.  5. Calcium 600 mg 3 q. a.m.  6. Vitamin E 400 IU 2 q.d.   PAST SURGICAL HISTORY:  1. Lumpectomy right breast in 1997, Dr. Orpah Greek.  2. Appendectomy in 2000, Dr. Luan Pulling.   FAMILY HISTORY:  Brother with polio. Mother died of an MI, history of  goiter. Father died of stroke. Also a history of DJD and cardiac problems.  Father's sister and mother both had ovarian cancer.   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  Two children, age 70 and 62 in  good health. Married now for three years. This is her second  marriage.   REVIEW OF SYSTEMS:  See chief complaint.   PHYSICAL EXAMINATION:  GENERAL:  An acutely ill female. Height 68.5 inches,  weight 189 pounds.  VITAL SIGNS:  Temperature 100.5, pulse 76, blood pressure 150/90.  Eyes  PERRLA. Extraocular movements intact. Ears clear. Nose and  throat clear.  NECK:  Supple.  CHEST:  Clear to auscultation and percussion.  HEART:  Normal S1, S2, no S3 or S4. No murmurs, rubs, gallops or click.  ABDOMEN:  Reveals right flank pain to percussion, CVA tenderness  bilaterally. No masses or organomegaly.  BREASTS:  Deferred.  PELVIC:  Revealed suprapubic tenderness.  RECTAL:  Deferred.  EXTREMITIES:  No edema.  NEUROLOGIC:  Oriented x 3 with cranial nerves II through XII intact.   ASSESSMENT:  Pyelonephritis.   PLAN:  Admit for IV fluid and antibiotic treatment. Will have a consult  with Dr. Candyce Churn.                                                 Lavinia Sharps, N.P.    MAP/MEDQ  D:  09/27/2001  T:  09/29/2001  Job:  [id number]   cc:   Candyce Churn, MD  [address] [place]  [place] [address]  Fax: [phone]

## 2010-05-23 NOTE — Op Note (Signed)
[facility]  Patient:    [name], [name]                   MRN: [id number] Proc. Date: 03/07/99 Adm. Date:  [id number] Disc. Date: [id number] Attending:  Orland Mustard CC:         Pearla Dubonnet, M.D.                           Operative Report  PROCEDURES:  Esophogastroduodenoscopy with biopsy.  MEDICATIONS:  Hurricaine spray, fentanyl 50 mcg, Versed 5 mg IV.  INDICATION:  Persistent upper abdominal pain of unclear etiology, with negative CT scan and gallbladder ultrasound, as well as lack of response to proton pump inhibitors.  DESCRIPTION OF PROCEDURE:  The procedure had been explained and the patient consent obtained.  With the patient in the left lateral decubitus position the Olympus video endoscope was inserted blindly into the esophagus and the ______ under direct visualization.  The stomach was entered, port was identified and passed.  The duodenum, including the bulb and second portion, was seen and all were unremarkable.  The scope was withdrawn back into the stomach.  The antrum was completely normal endoscopically.  A biopsy was taken for rapid urease test for  Helicobacter.  No other abnormalities were seen.  The scope was withdrawn in the retroflex view.  The stomach was normal.  Fundus and cardia were seen well.  There was a 3-4 cm hiatal hernia which attached to the E junction.  The distal and proximal esophagus were normal.  Scope was withdrawn.  The patient tolerated the procedure well.  Maintained on low-flow oxygen, pulse oximetry throughout the procedure; no problems.  ASSESSMENT:  Hiatal hernia, otherwise normal endoscopy.  PLAN:  Will double her Prilosec.  Will see her back in the office in three to four weeks. DD:  03/07/99 TD:  03/10/99 Job: [id number]

## 2010-05-23 NOTE — Discharge Summary (Signed)
[name], [name]            ACCOUNT NO.:  [id number]   MEDICAL RECORD NO.:  [id number]          PATIENT TYPE:  INP   LOCATION:  5020                         FACILITY:  [facility]   PHYSICIAN:  Mark C. Ophelia Charter, M.D.    DATE OF BIRTH:  March 28, 1955   DATE OF ADMISSION:  10/14/2007  DATE OF DISCHARGE:  10/17/2007                               DISCHARGE SUMMARY   ADMISSION DIAGNOSES:  1. Right lateral malleolar ankle fracture, dislocation.  2. Left fibular fracture.  3. Angina.  4. Gastroesophageal reflux disease.   DISCHARGE DIAGNOSES:  1. Right lateral malleolar ankle fracture, dislocation.  2. Left fibular fracture.  3. Angina.  4. Gastroesophageal reflux disease.   PROCEDURE:  On October 15, 2007, the patient underwent open reduction  internal fixation of right bimalleolar ankle fracture and casting to the  left fibular fracture.  This was performed by Dr. Ophelia Charter under general  anesthesia.   CONSULTATIONS:  None.   BRIEF HISTORY:  The patient is a 55 year old female who fell while at  church injuring her lower extremities.  She was brought to [facility]  Emergency Room for evaluation and was noted to have a right bimalleolar  ankle fracture, dislocation, as well as a left distal fibular fracture  which was nondisplaced.  It was felt her right lower extremity injury  would require surgical intervention and casting to the left lower  extremity.  She was admitted for the procedure as stated above.   BRIEF HOSPITAL COURSE:  Postoperatively, the patient utilized PCA  analgesics for discomfort and was weaned to p.o. analgesics.  The  patient was started on physical therapy for ambulation and gait  training.  She was allowed weightbearing as tolerated on the left lower  extremity and nonweightbearing on the right lower extremity.  She was  slow to advance with her physical therapy secondary to pain.  Exam  showed capillary refill and sensation of both feet to be intact.  She  was  afebrile, and vital signs were stable.  Durable medical equipment  was made available for the patient.  Eventually, she was stable for  discharge to her home with arrangements made for home health physical  therapy evaluation as well.  At discharge, the patient was afebrile and  vital signs were stable.   PERTINENT LABORATORY VALUES ON ADMISSION:  CBC with WBC 13.4, hemoglobin  13.8 and hematocrit 41.7.  Routine chemistry studies on admission within  normal limits.   PLAN:  The patient was discharged to home.  Arrangements were made for  home health physical therapy evaluation.  The patient was allowed  weightbearing as tolerated on the left lower extremity and touchdown  weightbearing to the right lower extremity utilizing a walker.  She will  keep her cast dry and clean at all times, as well as her postsurgical  dressing.  The patient will follow up with Dr. Ophelia Charter in approximately a  week to week and a half and will call to arrange the appointment.  Prescriptions included Darvocet-N 100 one to two every 4-6 hours as  needed for pain, Xanax 0.25 mg 1 every  8 hours as needed for anxiety.  She will continue on home medications as taken prior to admission and  was given medication reconciliation form with these instructions.  She  is instructed an ice and elevation.  The patient advised to call the  office should there be questions or concerns prior to her return office  visit.   CONDITION ON DISCHARGE:  Stable.      [name], P.A.      Mark C. Ophelia Charter, M.D.  Electronically Signed    SMV/MEDQ  D:  11/17/2007  T:  11/17/2007  Job:  [id number]

## 2010-05-23 NOTE — Op Note (Signed)
   NAME:  [name], [name]                      ACCOUNT NO.:  [id number]   MEDICAL RECORD NO.:  [id number]                   PATIENT TYPE:  AMB   LOCATION:  NESC                                 FACILITY:  [facility]   PHYSICIAN:  Cynthia P. Romine, M.D.             DATE OF BIRTH:  12/10/1955   DATE OF PROCEDURE:  08/07/2002  DATE OF DISCHARGE:                                 OPERATIVE REPORT   PREOPERATIVE DIAGNOSIS:  Menorrhagia.   POSTOPERATIVE DIAGNOSIS:  Menorrhagia.   PROCEDURE:  Endometrial ablation using Hydrotherm ablation.   SURGEON:  Cynthia P. Romine, M.D.   ANESTHESIA:  General endotracheal.   ESTIMATED BLOOD LOSS:  Minimal.   COMPLICATIONS:  None.   DESCRIPTION OF PROCEDURE:  The patient was taken to the operating room and  after the induction of adequate general endotracheal anesthesia was placed  in the dorsal lithotomy position and prepped and draped in the usual  fashion. The cervix was grasped at the anterior lip with a single tooth  tenaculum. The uterus sounded to 9 cm. The cervix was then dilated to a #23  Shawnie Pons. The hysteroscope was introduced, photographic documentation was taken  of the fundus. Hydrotherm ablation was then carried out in the usual fashion  without difficulty. Photographic documentation was taken at completion of  the procedure, the scope was removed and the procedure was terminated. The  instruments were removed from the vagina and the patient was taken to the  recovery room in satisfactory condition.                                               Cynthia P. Romine, M.D.    CPR/MEDQ  D:  08/07/2002  T:  08/07/2002  Job:  [id number]

## 2010-05-23 NOTE — Consult Note (Signed)
NAMEJOSLYN, Sampson                      ACCOUNT NO.:  [id number]   MEDICAL RECORD NO.:  [id number]                   PATIENT TYPE:  INP   LOCATION:  5743                                 FACILITY:  [facility]   PHYSICIAN:  Gabrielle Dare. Janee Morn, M.D.             DATE OF BIRTH:  1955-03-13   DATE OF CONSULTATION:  09/30/2001  DATE OF DISCHARGE:                                   CONSULTATION   REASON FOR CONSULTATION:  To evaluate gallbladder.   HISTORY OF PRESENT ILLNESS:  The patient is a [place] 55 year old female  who was admitted on September 27, 2001, with flank pain and CVA tenderness.  She has had some fever and nausea and vomiting since she has been in the  hospital. She was diagnosed with pyelonephritis  for which she is being  treated, but she continued to have some nausea and vomiting and initially  had some transaminase elevations. A biliary ultrasound was done. This was  normal. Further workup included a hepatobiliary scan which showed no  evidence of obstruction of cholecystitis with good steroid injection  fraction of 25% to 30%, consistent with some biliary dyskinesia.   The patient up to yesterday continued to have some nausea. She denies this  currently, but says she is afraid to take too much  p.o., as she would be  sick to her stomach. She denies having  right upper quadrant pain at this  time, but she does complain to have some right flank pain.   PAST MEDICAL HISTORY:  1. Anemia.  2. Allergic rhinitis.  3. Gastroesophageal reflux disease.  4. Pyelonephritis in the past.  5. Mitral valve prolapse.   ALLERGIES:  1. INTRAVENOUS PYELOGRAM DYE.  2. AXID.  3. OXYCODONE.  4. CODEINE.   PAST SURGICAL HISTORY:  1. Lumpectomy in 1997 by Dr. Orpah Greek.  2. Appendectomy in 2000 by Dr. Luan Pulling.   CURRENT MEDICATIONS:  1. Metoprolol.  2. Avapro.  3. Hydrochlorothiazide.  4. Protonix.  5. Calcium.  6. Claritin.  7. Rocephin IV.   REVIEW OF SYSTEMS:   Negative except for what was mentioned in the HPI.   PHYSICAL EXAMINATION:  VITAL SIGNS:  Temperature 98.6, pulse 90,  respirations 20, blood pressure 131/74, she is 96% saturating on room air.  GENERAL:  She is awake and alert in no acute distress.  NECK:  Supple without palpable adenopathy.  CHEST:  Clear to auscultation bilaterally.  HEART:  Regular rate and rhythm.  ABDOMEN:  Soft with no appreciable right upper quadrant tenderness. There  are active bowel sounds. She does have right costovertebral angle  tenderness. There is no costovertebral angle tenderness on the left.  EXTREMITIES:  Warm.  SKIN:  Dry.   LABORATORY DATA:  Sodium 141, potassium 3.7, chloride 106,  CO2 28, BUN 9,  creatinine 1, glucose 101, calcium 8.9. White blood cell count 9000, H&H  10.2/31.6, platelet 237. Bilirubin 0.5, alkaline phosphatase 143, SGOT  31,  SGPT 57, total protein 6.6, serum albumin 3.9.   ASSESSMENT:  1. Biliary dyskinesia. There is no evidence of cholecystitis at this time.  2. Pyelonephritis.   RECOMMENDATIONS:  At this time the patient's biliary dyskinesia seems to be  more of a chronic problem. I feel it would be safest to have her recover  from her pyelonephritis, considering that she does not have cholecystitis at  this time, and then we will plan to do a cholecystectomy on an elective  basis. She does request to see Dr. Derrell Lolling in our office, and she may call to  schedule an appointment once her current pyelonephritis  admission resolves.   Thank you very much for this consult on this very [name] patient.                                               Gabrielle Dare Janee Morn, M.D.    BET/MEDQ  D:  09/30/2001  T:  10/03/2001  Job:  [id number]

## 2010-10-07 LAB — POCT I-STAT, CHEM 8
BUN: 16
Calcium, Ion: 1.17
Glucose, Bld: 91
HCT: 42
Hemoglobin: 14.3
Potassium: 3.6
Sodium: 140
TCO2: 25

## 2010-10-07 LAB — DIFFERENTIAL
Basophils Absolute: 0
Eosinophils Relative: 1
Lymphocytes Relative: 13
Lymphs Abs: 1.8
Monocytes Absolute: 0.7
Monocytes Relative: 5
Neutrophils Relative %: 80 — ABNORMAL HIGH

## 2010-10-07 LAB — CBC
MCHC: 33
MCV: 88.4
RBC: 4.72
RDW: 13.8

## 2010-10-28 ENCOUNTER — Other Ambulatory Visit: Payer: Self-pay | Admitting: Obstetrics & Gynecology

## 2010-10-28 DIAGNOSIS — Z1231 Encounter for screening mammogram for malignant neoplasm of breast: Secondary | ICD-10-CM

## 2010-11-20 ENCOUNTER — Ambulatory Visit (HOSPITAL_COMMUNITY)
Admission: RE | Admit: 2010-11-20 | Discharge: 2010-11-20 | Disposition: A | Discharge status: Home / Self Care | Payer: 59 | Source: Ambulatory Visit | Attending: Obstetrics & Gynecology | Admitting: Obstetrics & Gynecology

## 2010-11-20 DIAGNOSIS — Z1231 Encounter for screening mammogram for malignant neoplasm of breast: Secondary | ICD-10-CM | POA: Insufficient documentation

## 2011-10-06 ENCOUNTER — Ambulatory Visit (INDEPENDENT_AMBULATORY_CARE_PROVIDER_SITE_OTHER): Payer: 59 | Admitting: Sports Medicine

## 2011-10-06 ENCOUNTER — Encounter: Payer: Self-pay | Admitting: Sports Medicine

## 2011-10-06 VITALS — BP 177/109 | HR 67 | Ht 69.0 in | Wt 216.0 lb

## 2011-10-06 DIAGNOSIS — R269 Unspecified abnormalities of gait and mobility: Secondary | ICD-10-CM

## 2011-10-06 DIAGNOSIS — M79609 Pain in unspecified limb: Secondary | ICD-10-CM

## 2011-10-06 DIAGNOSIS — M25579 Pain in unspecified ankle and joints of unspecified foot: Secondary | ICD-10-CM

## 2011-10-06 NOTE — Assessment & Plan Note (Addendum)
Patient was fitted for a : standard, cushioned, semi-rigid orthotic. The orthotic was heated and afterward the patient stood on the orthotic blank positioned on the orthotic stand. The patient was positioned in subtalar neutral position and 10 degrees of ankle dorsiflexion in a weight bearing stance. After completion of molding, a stable base was applied to the orthotic blank. The blank was ground to a stable position for weight bearing. Size: 11 Base: Black and white stripe Posting: Red with medial wedge  Additional orthotic padding: 1st ray post blue foam bilat  New orthotics which should fit more shoes and provide more support Her old ones are pretty worn out  Patient was comfortable in orthotics and walking gait was neutral.

## 2011-10-06 NOTE — Assessment & Plan Note (Signed)
Gait is improved in the orthotics  Genu valgus is much less and pronation is controlled

## 2011-10-06 NOTE — Progress Notes (Signed)
Patient ID: [name], female   DOB: 1955-08-12, 56 y.o.   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 Ophelia Charter did plates for the right ankle that have subsequently been removed She walks extensively during her nursing rounds By the end of the day the right ankle swells a lot in the left ankle somewhat Her orthotics in 2011 and these have helped reduce the ankle pain and the swelling  She also has a history of bunions She had foot pain related to these and her gait was abnormal   Examination   Right ankle shows mild limitation in dorsiflexion in moderate limitation in plantarflexion  Left ankle shows improved dorsiflexion and plantarflexion that is in the normal range  Inversion and eversion are normal  Bunions bilaterally with hallux rigidus on the right that is moderate in on the left that is mild  Gait shows pronation and genu valgus

## 2011-10-06 NOTE — Assessment & Plan Note (Signed)
Considering her ankle fracture and previous surgery I think she is developing some DJD  Plan on trying a compression brace for her right ankle

## 2011-10-15 ENCOUNTER — Other Ambulatory Visit: Payer: Self-pay | Admitting: Obstetrics & Gynecology

## 2011-10-15 ENCOUNTER — Other Ambulatory Visit (HOSPITAL_COMMUNITY): Payer: Self-pay | Admitting: *Deleted

## 2011-10-15 DIAGNOSIS — Z1231 Encounter for screening mammogram for malignant neoplasm of breast: Secondary | ICD-10-CM

## 2011-11-26 ENCOUNTER — Ambulatory Visit (HOSPITAL_COMMUNITY): Payer: 59

## 2012-01-01 ENCOUNTER — Ambulatory Visit
Admission: RE | Admit: 2012-01-01 | Discharge: 2012-01-01 | Disposition: A | Discharge status: Home / Self Care | Payer: 59 | Source: Ambulatory Visit | Attending: Obstetrics & Gynecology | Admitting: Obstetrics & Gynecology

## 2012-01-01 DIAGNOSIS — Z1231 Encounter for screening mammogram for malignant neoplasm of breast: Secondary | ICD-10-CM

## 2012-02-20 ENCOUNTER — Other Ambulatory Visit: Payer: Self-pay

## 2012-09-29 ENCOUNTER — Encounter: Payer: Self-pay | Admitting: Obstetrics & Gynecology

## 2012-11-10 ENCOUNTER — Other Ambulatory Visit: Payer: Self-pay

## 2013-01-17 ENCOUNTER — Encounter: Payer: Self-pay | Admitting: *Deleted

## 2013-01-17 ENCOUNTER — Encounter: Payer: 59 | Attending: Internal Medicine | Admitting: *Deleted

## 2013-01-17 VITALS — Ht 69.0 in | Wt 229.0 lb

## 2013-01-17 DIAGNOSIS — I10 Essential (primary) hypertension: Secondary | ICD-10-CM

## 2013-01-17 DIAGNOSIS — Z713 Dietary counseling and surveillance: Secondary | ICD-10-CM | POA: Insufficient documentation

## 2013-01-17 DIAGNOSIS — E669 Obesity, unspecified: Secondary | ICD-10-CM

## 2013-01-17 NOTE — Patient Instructions (Signed)
Goals:  Put away the scale.  Refrain from weighing self between nutrition appointments Reject diet mentality- there are no good or bad foods Listen to internal hunger cues and honor those cues; don't wait until you're ravenous to eat Set alarm on phone for every 3 hours.  Assess self.  Address any concern/needs Choose the food(s) you want Enjoy those foods.  Try to make meal last 20 minutes.   Drink in between bites. Keep water bottle with you at work Sit down to eat without distractions Stop eating when full.  Honor fullness cues too Do these things without any guilt or regret

## 2013-01-17 NOTE — Progress Notes (Signed)
Medical Nutrition Therapy:  Appt start time: 0800 end time:  0900.  Assessment:  Primary concerns today: [name] is here for nutrition counseling pertaining to obesity.  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  She reports that she eats only once a day (breakfast) and then she forgets to eat the rest of the day. She reports gaining weight around 1996 when she was going through a divorce.  She broke out in hives and was put on steroids twice.  She gained 18 pounds for 2 rounds of steroids for a total of 32 pounds.  Her usual adult weight was 140.  She remarried in 2000 and weighed 164.  She states that she has been gaining every since.  She states that she is currently at her highest weight.  Her lowest weight as an adult was 38 as a 58 year old.  She would like to weigh 160.  She weighed 229 at her last doctor's visit a month ago.  She has followed the Julien Girt Diet and lost 14 pounds in 2 weeks, but she backed off that and stopped.  She gained the weight back.  She is struggling with her blood pressure recently and her doctors are trying to manage her medications better and she reports better control now.   [name] lives at home with her husband.  She does the shopping and most of the cooking.  They eat out only once a week (she likes to cook): fish, slaw, asparagus.  She uses a combination of cooking techniques.  They eat at the kitchen table and she reports reading the newspaper while eating.  She states she  Is a fast eater. She works 3 12 hr shifts during the week.    Preferred Learning Style:   Auditory  Learning Readiness:   Ready   MEDICATIONS: see list   DIETARY INTAKE:  Usual eating pattern includes 1 meals and 0-2 snacks per day.  Everyday foods include starches, sometimes proteins or vegetables.  Avoided foods include pork.    24-hr recall: work day B ( AM): bowl outmeal with coffee.  Sleeps until noon L ( PM): nothing Snk ( PM): nothing.  Might have frozen  blueberries sometimes and gluten-free crackers D ( PM): lunchable Snk ( PM): hot chocolate Beverages: fruit water sometimes.  Coffee.  Sometimes she doesn't drink.  Every once in awhile she drinks Mt. Dew if sleepy at work  24-hours recall: day off B: bowl honey nut cheerios  [employer] tea with fruit juice mixed in L: nothing D: vegetable soup and crackers S: not usually  On Sundays they eat Mrs. Winner's chicken with steamed (frozen) vegetables  Usual physical activity: walks 4 days 1 mile  Estimated energy needs: 1800 calories 200 g carbohydrates 135 g protein 50 g fat    Nutritional Diagnosis:  NB-1.5 Disordered eating pattern As related to meal skipping and limited adherance to internal hunger and fullness cues.  As evidenced by dietary recall and involuntary weight gain.    Intervention:  Nutrition counseling provided.  Encouraged patient to reject traditional diet mentality of "good" vs "bad" foods.  There are no good and bad foods, but rather food is fuel that we needs for our bodies.  When we don't get enough fuel, our bodies suffer the metabolic consequences.  Encouraged patient to eat whatever foods will satisfy them, regardless of their nutritional value.  We will discuss nutritional values of foods at a subsequent appointment.  Encouraged patient  to honor their body's internal hunger and fullness cues.  Throughout the day, check in mentally and rate hunger.  Try not to eat when ravenous, but instead when slightly hungry.  Then choose food(s) that will be satisfying regardless of nutritional content.  Sit down to enjoy those foods.  Minimize distractions: turn off tv, put away books, work, Brewing technologist.  Make the meal last at least 20 minutes in order to give time to experience and register satiety.  Stop eating when full regardless of how much food is left on the plate.  Get more if still hungry.  The key is to honor fullness so throughout the meal, rate fullness factor and  stop when comfortably full, but not stuffed.  Reminded patient that they can have any food they want, whenever they want, and however much they want.  Eventually the novelty will wear out and each food will be equal in terms of its emotional appeal.  This will be a learning process and some days more food will be eaten, some days less.  The key is to honor hunger and fullness without any feelings of guilt.  Pay attention to what the internal cues are, rather than any external factors.  Teaching Method Utilized:  Auditory  Barriers to learning/adherence to lifestyle change: work schedule  Demonstrated degree of understanding via:  Teach Back   Monitoring/Evaluation:  Dietary intake, exercise, and body weight in 1 month(s).,

## 2013-02-01 ENCOUNTER — Ambulatory Visit: Payer: Self-pay | Admitting: Obstetrics & Gynecology

## 2013-02-02 ENCOUNTER — Encounter: Payer: Self-pay | Admitting: Obstetrics & Gynecology

## 2013-02-03 ENCOUNTER — Ambulatory Visit: Payer: Self-pay | Admitting: Obstetrics & Gynecology

## 2013-02-07 ENCOUNTER — Ambulatory Visit (INDEPENDENT_AMBULATORY_CARE_PROVIDER_SITE_OTHER): Payer: 59 | Admitting: Obstetrics & Gynecology

## 2013-02-07 ENCOUNTER — Encounter: Payer: Self-pay | Admitting: Obstetrics & Gynecology

## 2013-02-07 VITALS — BP 134/80 | HR 64 | Resp 16 | Ht 68.0 in | Wt 221.2 lb

## 2013-02-07 DIAGNOSIS — R3915 Urgency of urination: Secondary | ICD-10-CM

## 2013-02-07 DIAGNOSIS — Z1239 Encounter for other screening for malignant neoplasm of breast: Secondary | ICD-10-CM

## 2013-02-07 DIAGNOSIS — N398 Other specified disorders of urinary system: Secondary | ICD-10-CM

## 2013-02-07 DIAGNOSIS — Z0000 Encounter for general adult medical examination without abnormal findings: Secondary | ICD-10-CM

## 2013-02-07 DIAGNOSIS — IMO0002 Reserved for concepts with insufficient information to code with codable children: Secondary | ICD-10-CM

## 2013-02-07 DIAGNOSIS — Z01419 Encounter for gynecological examination (general) (routine) without abnormal findings: Secondary | ICD-10-CM

## 2013-02-07 DIAGNOSIS — E2839 Other primary ovarian failure: Secondary | ICD-10-CM

## 2013-02-07 NOTE — Progress Notes (Signed)
58 y.o. G4P2 Married African AmericanF here for annual exam.  No vaginal bleeding.  Reports blood pressure just went out of control after medication being switch.  Labs with Dr. Inda Merlin was within the last year.  Cholesterol is mildly elevated.  Patient reports this past year that her voiding has changed.  Sits down to void and has to strain a little to start stream.  Then stops and has to sit a little longer before can finish voiding.  No pain.  + urgency.  No blood.  Urine negative here today.    Patient's last menstrual period was 07/05/2009.          Sexually active: yes  The current method of family planning is none.    Exercising: yes  walking one mile three x weekly Smoker:  no  Health Maintenance: Pap:  11/05/11 normal, 1/12 neg HR HPV History of abnormal Pap:  no MMG:  01/01/12 3D aware is due Colonoscopy:  8/09 repeat in 10 years, Dr. Oletta Lamas BMD:   2009 TDaP:  Up to date Screening Labs: Dr. Inda Merlin, Hb today: 13.4, Urine today: negative   reports that she has never smoked. She has never used smokeless tobacco. She reports that she does not drink alcohol or use illicit drugs.  Past Medical History  Diagnosis Date  . Hypertension   . GERD (gastroesophageal reflux disease)   . Lactose intolerance   . MVP (mitral valve prolapse)   . Anxiety   . Pyelonephritis   . Anemia     as a child    Past Surgical History  Procedure Laterality Date  . Appendectomy    . Cholecystectomy    . Dilation and curettage of uterus    . Ankle fracture  10/09    bilateral-right was surgically repaired  . Ankle surgery  10/10    screws removed  . Ankle surgery  2/12    removal of all hardware right ankle  . Arm surgery      plate in left arm    Current Outpatient Prescriptions  Medication Sig Dispense Refill  . amLODipine (NORVASC) 5 MG tablet Take 5 mg by mouth daily.      . calcium carbonate 200 MG capsule Take 600 mg by mouth daily.      . cloNIDine (CATAPRES) 0.1 MG tablet Take 0.1  mg by mouth 3 (three) times daily.      Marland Kitchen esomeprazole (NEXIUM) 20 MG capsule Take 20 mg by mouth daily.      . fexofenadine (ALLEGRA) 180 MG tablet Take 180 mg by mouth daily.      Marland Kitchen ibuprofen (ADVIL,MOTRIN) 100 MG chewable tablet Chew 800 mg by mouth every 8 (eight) hours as needed.       Marland Kitchen losartan (COZAAR) 25 MG tablet Take 25 mg by mouth daily.      . metoprolol tartrate (LOPRESSOR) 25 MG tablet Take 25 mg by mouth. Taking 1 1/2 tabs daily       No current facility-administered medications for this visit.    Family History  Problem Relation Age of Onset  . Cancer Other   . Hypertension Other   . Hyperlipidemia Other   . Stroke Other   . Heart attack Other   . Diabetes Maternal Grandmother   . Ovarian cancer Paternal Grandmother   . Prostate cancer Paternal Grandfather   . Ovarian cancer Paternal Aunt   . Breast cancer Maternal Aunt     post menopause  . Breast cancer Other  1st cousin, mastectomy  . Hypertension Mother   . Heart disease Mother   . Heart disease Father   . Ulcers Brother   . Goiter Mother   . Other Brother     polio  . Pyelonephritis Daughter     ROS:  Pertinent items are noted in HPI.  Otherwise, a comprehensive ROS was negative.  Exam:   BP 134/80  Pulse 64  Resp 16  Ht 5\' 8"  (1.727 m)  Wt 221 lb 3.2 oz (100.336 kg)  BMI 33.64 kg/m2  LMP 07/05/2009  Weight change:+5 lbs   Height: 5\' 8"  (172.7 cm)  Ht Readings from Last 3 Encounters:  02/07/13 5\' 8"  (1.727 m)  01/17/13 5\' 9"  (1.753 m)  10/06/11 5\' 9"  (1.753 m)    General appearance: alert, cooperative and appears stated age Head: Normocephalic, without obvious abnormality, atraumatic Neck: no adenopathy, supple, symmetrical, trachea midline and thyroid normal to inspection and palpation Lungs: clear to auscultation bilaterally Breasts: normal appearance, no masses or tenderness Heart: regular rate and rhythm Abdomen: soft, non-tender; bowel sounds normal; no masses,  no  organomegaly Extremities: extremities normal, atraumatic, no cyanosis or edema Skin: Skin color, texture, turgor normal. No rashes or lesions Lymph nodes: Cervical, supraclavicular, and axillary nodes normal. No abnormal inguinal nodes palpated Neurologic: Grossly normal   Pelvic: External genitalia:  no lesions              Urethra:  normal appearing urethra with no masses, tenderness or lesions              Bartholins and Skenes: normal                 Vagina: normal appearing vagina with normal color and discharge, no lesions              Cervix: no lesions              Pap taken: no Bimanual Exam:  Uterus:  normal size, contour, position, consistency, mobility, non-tender              Adnexa: normal adnexa and no mass, fullness, tenderness               Rectovaginal: Confirms               Anus:  normal sphincter tone, no lesions  A:  Well Woman with normal exam PMP, no HRT Hypertension New onset voiding dysfunction/urgency  P:   Mammogram yearly.  Will schedule today. pap smear neg 2013.  Neg HR HPV 2012.  No Pap today. Labs with Dr. Inda Merlin. Will repeat BMD this year.  Will schedule for patient.   Referral to Dr. Matilde Sprang return annually or prn  An After Visit Summary was printed and given to the patient.

## 2013-02-07 NOTE — Patient Instructions (Signed)

## 2013-02-08 LAB — HEMOGLOBIN, FINGERSTICK: Hemoglobin, fingerstick: 13.4 g/dL (ref 12.0–16.0)

## 2013-02-09 ENCOUNTER — Other Ambulatory Visit: Payer: Self-pay | Admitting: Obstetrics & Gynecology

## 2013-02-09 ENCOUNTER — Ambulatory Visit (HOSPITAL_COMMUNITY)
Admission: RE | Admit: 2013-02-09 | Discharge: 2013-02-09 | Disposition: A | Discharge status: Home / Self Care | Payer: 59 | Source: Ambulatory Visit | Attending: Obstetrics & Gynecology | Admitting: Obstetrics & Gynecology

## 2013-02-09 DIAGNOSIS — Z1239 Encounter for other screening for malignant neoplasm of breast: Secondary | ICD-10-CM

## 2013-02-09 DIAGNOSIS — Z1231 Encounter for screening mammogram for malignant neoplasm of breast: Secondary | ICD-10-CM | POA: Insufficient documentation

## 2013-02-09 DIAGNOSIS — Z13820 Encounter for screening for osteoporosis: Secondary | ICD-10-CM | POA: Insufficient documentation

## 2013-02-09 DIAGNOSIS — Z780 Asymptomatic menopausal state: Secondary | ICD-10-CM | POA: Insufficient documentation

## 2013-02-09 DIAGNOSIS — E2839 Other primary ovarian failure: Secondary | ICD-10-CM

## 2013-02-23 ENCOUNTER — Telehealth: Payer: Self-pay

## 2013-02-23 NOTE — Telephone Encounter (Signed)
Spoke with patient-aware of BMD results.

## 2013-02-24 ENCOUNTER — Encounter: Payer: 59 | Attending: Internal Medicine | Admitting: *Deleted

## 2013-02-24 DIAGNOSIS — Z713 Dietary counseling and surveillance: Secondary | ICD-10-CM | POA: Insufficient documentation

## 2013-02-24 DIAGNOSIS — E669 Obesity, unspecified: Secondary | ICD-10-CM | POA: Insufficient documentation

## 2013-02-24 NOTE — Progress Notes (Signed)
  Medical Nutrition Therapy:  Appt start time: 1130 end time:  1200.  Assessment:  [name] is here for follow up nutrition counseling.  Last visit we discussed intuitive eating and she has made great progress.  She States she has more energy.  She has been eating more regularly scheduled meals and snacks.  She lost about 7 pounds since last visit.  She feels a lot better.  She isn't sure what types of foods to eat.  She eats fish some, chicken, no pork, [place], beef.  She likes nuts (pistachios or almonds) and fruit.  She drinks apple, pineapple, orange juices mixed together and she'll pour that in her tea   Preferred Learning Style:   Auditory  Learning Readiness:   Ready   MEDICATIONS: see list   DIETARY INTAKE:  Usual eating pattern includes 1 meals and 0-2 snacks per day.  Everyday foods include starches, sometimes proteins or vegetables.  Avoided foods include pork.    24-hr recall: work day B ( AM): bowl outmeal with coffee.   L ( PM): leftover potatoes and cabbage Snk ( PM): Might have frozen blueberries sometimes and gluten-free crackers D ( PM): chicken with pinto beans and potatoes au gratin Snk ( PM): nothing Beverages: tea and mt dew   Usual physical activity: walks 4 days 1 mile  Estimated energy needs: 1800 calories 200 g carbohydrates 135 g protein 50 g fat    Nutritional Diagnosis:  NB-1.5 Disordered eating pattern As related to meal skipping and limited adherance to internal hunger and fullness cues.  As evidenced by dietary recall and involuntary weight gain.    Intervention: Praised Rosina for her progress.   Encouraged her to keep up the good work.  Aim to increase water consumption: for every glass of tea, drink 1 one of water.    Teaching Method Utilized:  Auditory  Barriers to learning/adherence to lifestyle change: work schedule  Demonstrated degree of understanding via:  Teach Back   Monitoring/Evaluation:  Dietary intake, exercise, and  body weight prn.,

## 2013-06-01 ENCOUNTER — Emergency Department (HOSPITAL_COMMUNITY)
Admission: EM | Admit: 2013-06-01 | Discharge: 2013-06-01 | Disposition: A | Discharge status: Home / Self Care | Payer: 59 | Attending: Emergency Medicine | Admitting: Emergency Medicine

## 2013-06-01 ENCOUNTER — Emergency Department (HOSPITAL_COMMUNITY): Payer: 59

## 2013-06-01 ENCOUNTER — Encounter (HOSPITAL_COMMUNITY): Payer: Self-pay | Admitting: Emergency Medicine

## 2013-06-01 DIAGNOSIS — Z79899 Other long term (current) drug therapy: Secondary | ICD-10-CM | POA: Insufficient documentation

## 2013-06-01 DIAGNOSIS — Z862 Personal history of diseases of the blood and blood-forming organs and certain disorders involving the immune mechanism: Secondary | ICD-10-CM | POA: Insufficient documentation

## 2013-06-01 DIAGNOSIS — F411 Generalized anxiety disorder: Secondary | ICD-10-CM | POA: Insufficient documentation

## 2013-06-01 DIAGNOSIS — I059 Rheumatic mitral valve disease, unspecified: Secondary | ICD-10-CM | POA: Insufficient documentation

## 2013-06-01 DIAGNOSIS — Z87448 Personal history of other diseases of urinary system: Secondary | ICD-10-CM | POA: Insufficient documentation

## 2013-06-01 DIAGNOSIS — I10 Essential (primary) hypertension: Secondary | ICD-10-CM | POA: Insufficient documentation

## 2013-06-01 DIAGNOSIS — K219 Gastro-esophageal reflux disease without esophagitis: Secondary | ICD-10-CM | POA: Insufficient documentation

## 2013-06-01 DIAGNOSIS — R42 Dizziness and giddiness: Secondary | ICD-10-CM | POA: Insufficient documentation

## 2013-06-01 DIAGNOSIS — R55 Syncope and collapse: Secondary | ICD-10-CM | POA: Insufficient documentation

## 2013-06-01 DIAGNOSIS — J329 Chronic sinusitis, unspecified: Secondary | ICD-10-CM | POA: Insufficient documentation

## 2013-06-01 MED ORDER — AZITHROMYCIN 250 MG PO TABS: 250.0000 mg | ORAL_TABLET | Freq: Every day | ORAL | Status: DC

## 2013-06-01 MED ORDER — LORAZEPAM 1 MG PO TABS
1.0000 mg | ORAL_TABLET | Freq: Once | ORAL | Status: AC
  Administered 2013-06-01: 1 mg via ORAL
  Filled 2013-06-01: qty 1

## 2013-06-01 NOTE — ED Provider Notes (Signed)
CSN: [id number]     Arrival date & time 06/01/13  2202 History   First MD Initiated Contact with Patient 06/01/13 [phone]     Chief Complaint  Patient presents with  . 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 She has a history of hypertension, acid reflux and mitral valve prolapse. She states that approximately 2 weeks ago she started to develop dizziness and had a syncopal episode where she was on the floor of her carotid for over 2 hours before she awoke. She saw her family doctor and has been referred to a neurologist which she will see him shortly. She states that she has also been treated recently for a sinus type illness with antihistamines, decongestants with some relief. Over the last couple of days she has had the return of some of the symptoms including thick purulent mucus which no drains from her nose down her throat and is yellow in color. Nothing makes this better, worse with laying down, not associated with fevers chills nausea or vomiting. She states that she has had ongoing lightheadedness especially when she stands and has thought that she has had a slight change in her vision which she reports as slight decreased acuity. She has had no trouble using her arms or legs but does cite that she feels her left hand is slightly weaker than the right hand. There is no change in speech, no change in ability to walk, no change in coordination.  Patient is a 58 y.o. female presenting with cough. The history is provided by the patient and the spouse.  Cough   Past Medical History  Diagnosis Date  . Hypertension   . GERD (gastroesophageal reflux disease)   . Lactose intolerance   . MVP (mitral valve prolapse)   . Anxiety   . Pyelonephritis   . Anemia     as a child   Past Surgical History  Procedure Laterality Date  .  Appendectomy    . Cholecystectomy    . Dilation and curettage of uterus    . Ankle fracture  10/09    bilateral-right was surgically repaired  . Ankle surgery  10/10    screws removed  . Ankle surgery  2/12    removal of all hardware right ankle  . Arm surgery      plate in left arm   Family History  Problem Relation Age of Onset  . Cancer Other   . Hypertension Other   . Hyperlipidemia Other   . Stroke Other   . Heart attack Other   . Diabetes Maternal Grandmother   . Ovarian cancer Paternal Grandmother   . Prostate cancer Paternal Grandfather   . Ovarian cancer Paternal Aunt   . Breast cancer Maternal Aunt     post menopause  . Breast cancer Other     1st cousin, mastectomy  . Hypertension Mother   . Heart disease Mother   . Heart disease Father   . Ulcers Brother   . Goiter Mother   . Other Brother     polio  . Pyelonephritis Daughter    History  Substance Use Topics  . Smoking status: Never Smoker   . Smokeless tobacco: Never Used  . Alcohol Use: No   OB History   Grav Para Term Preterm Abortions TAB SAB Ect Mult Living   4 2  2     Obstetric Comments   1 stepchild     Review of Systems  Respiratory: Positive for cough.   All other systems reviewed and are negative.     Allergies  Crab; Ivp dye; Nizatidine; Dilaudid; Hydrocodone-acetaminophen; and Oxycodone-acetaminophen  Home Medications   Prior to Admission medications   Medication Sig Start Date End Date Taking? Authorizing Provider  amLODipine (NORVASC) 5 MG tablet Take 5 mg by mouth daily.   Yes Historical Provider, MD  Calcium-Magnesium-Vitamin D (CALCIUM 1200+D3) 600-40-500 MG-MG-UNIT TB24 Take 3 tablets by mouth daily.   Yes Historical Provider, MD  cloNIDine (CATAPRES) 0.1 MG tablet Take 0.1 mg by mouth 2 (two) times daily.    Yes Historical Provider, MD  esomeprazole (NEXIUM) 40 MG capsule Take 40 mg by mouth daily at 12 noon.   Yes Historical Provider, MD  fexofenadine (ALLEGRA)  180 MG tablet Take 180 mg by mouth daily.   Yes Historical Provider, MD  ibuprofen (ADVIL,MOTRIN) 800 MG tablet Take 800 mg by mouth every 8 (eight) hours as needed for moderate pain.   Yes Historical Provider, MD  losartan (COZAAR) 100 MG tablet Take 100 mg by mouth daily.   Yes Historical Provider, MD  metoprolol succinate (TOPROL-XL) 50 MG 24 hr tablet Take 50 mg by mouth daily. Take with or immediately following a meal.   Yes Historical Provider, MD  mometasone-formoterol (DULERA) 100-5 MCG/ACT AERO Inhale 2 puffs into the lungs daily as needed for wheezing or shortness of breath.   Yes Historical Provider, MD  oxymetazoline (AFRIN) 0.05 % nasal spray Place 1 spray into both nostrils daily as needed for congestion.   Yes Historical Provider, MD   BP 125/66  Pulse 57  Temp(Src) 98.2 F (36.8 C) (Oral)  Resp 18  Ht 5\' 8"  (1.727 m)  Wt 220 lb (99.791 kg)  BMI 33.46 kg/m2  SpO2 95%  LMP 07/05/2009 Physical Exam  Nursing note and vitals reviewed. Constitutional: She appears well-developed and well-nourished. No distress.  HENT:  Head: Normocephalic and atraumatic.  Mouth/Throat: Oropharynx is clear and moist. No oropharyngeal exudate.  Purulent material in the posterior pharynx, nasal passages clear, tympanic membranes clear, mucous membranes moist, dentition without significant abnormality  Eyes: Conjunctivae and EOM are normal. Pupils are equal, round, and reactive to light. Right eye exhibits no discharge. Left eye exhibits no discharge. No scleral icterus.  Neck: Normal range of motion. Neck supple. No JVD present. No thyromegaly present.  Cardiovascular: Normal rate, regular rhythm, normal heart sounds and intact distal pulses.  Exam reveals no gallop and no friction rub.   No murmur heard. Pulmonary/Chest: Effort normal and breath sounds normal. No respiratory distress. She has no wheezes. She has no rales.  Abdominal: Soft. Bowel sounds are normal. She exhibits no distension and no  mass. There is no tenderness.  Musculoskeletal: Normal range of motion. She exhibits no edema and no tenderness.  Lymphadenopathy:    She has no cervical adenopathy.  Neurological: She is alert. Coordination normal.  Speech is clear, cranial nerves III through XII are intact, memory is intact, strength is normal in all 4 extremities including grips, sensation is intact to light touch and pinprick in all 4 extremities. Coordination as tested by finger-nose-finger is normal, no limb ataxia.  Skin: Skin is warm and dry. No rash noted. No erythema.  Psychiatric: She has a normal mood and affect. Her behavior is normal.    ED Course  Procedures (including critical care time) Labs Review  Labs Reviewed - No data to display  Imaging Review Ct Head Wo Contrast  06/01/2013   CLINICAL DATA:  Severe headache in  EXAM: CT HEAD WITHOUT CONTRAST  TECHNIQUE: Contiguous axial images were obtained from the base of the skull through the vertex without intravenous contrast.  COMPARISON:  None.  FINDINGS: There is no acute intracranial hemorrhage or infarct. No mass lesion or midline shift. Gray-white matter differentiation is well maintained. Ventricles are normal in size without evidence of hydrocephalus. CSF containing spaces are within normal limits. No extra-axial fluid collection.  The calvarium is intact.  Orbital soft tissues are within normal limits.  The paranasal sinuses and mastoid air cells are well pneumatized and free of fluid.  Scalp soft tissues are unremarkable.  IMPRESSION: Normal head CT with no acute intracranial abnormality identified.   Electronically Signed   By: Jeannine Boga M.D.   On: 06/01/2013 06:52     EKG Interpretation   Date/Time:  Thursday Jun 01 2013 05:31:46 EDT Ventricular Rate:  58 PR Interval:  181 QRS Duration: 80 QT Interval:  435 QTC Calculation: 427 R Axis:   51 Text Interpretation:  Sinus rhythm Normal ECG since last tracing no  significant change  Confirmed by Ethylene Reznick  MD, Janann Boeve ([id number]) on 06/01/2013  5:38:33 AM      MDM   Final diagnoses:  Light headedness  Sinusitis    The patient has no focal neurologic deficits, she is very concerned about these lightheaded spells, she does appear to have ongoing sinus symptoms and would probably benefit from an antibiotic and ongoing decongestant use. Given her recent syncopal episode and frequent episodes of lightheadedness and possible change in vision I will obtain a CT scan of the head to look for tumors or other source of symptoms. The patient is in agreement with the plan.  The patient has been ambulatory to the bathroom without difficulty, CT scan of the head negative for acute findings, EKG totally normal, patient informed of the results, will followup with urology in the morning. She has an appointment at that time.  Meds given in ED:  Medications  LORazepam (ATIVAN) tablet 1 mg (1 mg Oral Given 06/01/13 0543)    New Prescriptions   AZITHROMYCIN (ZITHROMAX Z-PAK) 250 MG TABLET    Take 1 tablet (250 mg total) by mouth daily. 500mg  PO day 1, then 250mg  PO days [place] Neeraj Housand, MD 06/01/13 [phone]

## 2013-06-01 NOTE — Discharge Instructions (Signed)
Please call your doctor for a followup appointment within 24-48 hours. When you talk to your doctor please let them know that you were seen in the emergency department and have them acquire all of your records so that they can discuss the findings with you and formulate a treatment plan to fully care for your new and ongoing problems. ° °

## 2013-06-01 NOTE — ED Notes (Signed)
Pt. reports dry cough , sinus congestion with post nasal drip / dizziness onset yesterday .

## 2013-06-02 ENCOUNTER — Encounter: Payer: Self-pay | Admitting: Neurology

## 2013-06-02 ENCOUNTER — Ambulatory Visit (INDEPENDENT_AMBULATORY_CARE_PROVIDER_SITE_OTHER): Payer: 59 | Admitting: Neurology

## 2013-06-02 VITALS — BP 126/74 | HR 63 | Temp 98.0°F | Ht 68.0 in | Wt 216.5 lb

## 2013-06-02 DIAGNOSIS — R519 Headache, unspecified: Secondary | ICD-10-CM | POA: Insufficient documentation

## 2013-06-02 DIAGNOSIS — R400 Somnolence: Secondary | ICD-10-CM | POA: Insufficient documentation

## 2013-06-02 DIAGNOSIS — R51 Headache: Secondary | ICD-10-CM | POA: Insufficient documentation

## 2013-06-02 DIAGNOSIS — R404 Transient alteration of awareness: Secondary | ICD-10-CM

## 2013-06-02 DIAGNOSIS — M542 Cervicalgia: Secondary | ICD-10-CM | POA: Insufficient documentation

## 2013-06-02 DIAGNOSIS — R42 Dizziness and giddiness: Secondary | ICD-10-CM | POA: Insufficient documentation

## 2013-06-02 DIAGNOSIS — G4710 Hypersomnia, unspecified: Secondary | ICD-10-CM

## 2013-06-02 MED ORDER — DIAZEPAM 5 MG PO TABS: ORAL_TABLET | ORAL | Status: DC

## 2013-06-02 NOTE — Patient Instructions (Signed)
1. MRI brain with and without contrast 2. MRI cervical spine without contrast 3. Sleep study 4. Bloodwork for TSH, vitamin B12 5. Follow-up after the tests
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
## 2013-06-03 LAB — TSH: TSH: 1.048 u[IU]/mL (ref 0.350–4.500)

## 2013-06-03 LAB — VITAMIN B12: Vitamin B-12: 414 pg/mL (ref 211–911)

## 2013-06-05 ENCOUNTER — Encounter: Payer: Self-pay | Admitting: Neurology

## 2013-06-12 ENCOUNTER — Ambulatory Visit (HOSPITAL_BASED_OUTPATIENT_CLINIC_OR_DEPARTMENT_OTHER): Payer: 59 | Attending: Neurology | Admitting: Radiology

## 2013-06-12 DIAGNOSIS — G4733 Obstructive sleep apnea (adult) (pediatric): Secondary | ICD-10-CM | POA: Insufficient documentation

## 2013-06-12 DIAGNOSIS — I4949 Other premature depolarization: Secondary | ICD-10-CM | POA: Insufficient documentation

## 2013-06-12 DIAGNOSIS — R0609 Other forms of dyspnea: Secondary | ICD-10-CM | POA: Insufficient documentation

## 2013-06-12 DIAGNOSIS — R0683 Snoring: Secondary | ICD-10-CM

## 2013-06-12 DIAGNOSIS — R0989 Other specified symptoms and signs involving the circulatory and respiratory systems: Secondary | ICD-10-CM | POA: Insufficient documentation

## 2013-06-12 DIAGNOSIS — R55 Syncope and collapse: Secondary | ICD-10-CM

## 2013-06-17 DIAGNOSIS — G4730 Sleep apnea, unspecified: Secondary | ICD-10-CM

## 2013-06-17 DIAGNOSIS — G4710 Hypersomnia, unspecified: Secondary | ICD-10-CM

## 2013-06-17 NOTE — Sleep Study (Signed)
   NAME: [name] DATE OF BIRTH:  [birth year] MEDICAL RECORD NUMBER [id number]  LOCATION: [facility]  PHYSICIAN: Johne Buckle D  DATE OF STUDY: 06/12/2013  SLEEP STUDY TYPE: Nocturnal Polysomnogram               REFERRING PHYSICIAN: Cameron Sprang, MD  INDICATION FOR STUDY: Hypersomnia with sleep apnea  EPWORTH SLEEPINESS SCORE:   7/24 HEIGHT:    WEIGHT:      There is no weight on file to calculate BMI.  NECK SIZE: 14.5 in.  MEDICATIONS: Charted for review  SLEEP ARCHITECTURE: Split study protocol. During the diagnostic phase, total sleep time 128 minutes with sleep efficiency 74.6%. Stage I was 6.3%, stage II 93.8%, stage III and REM were absent. Sleep latency 6.5 minutes, awake after sleep onset 33.5 minutes, arousal index 28.6, bedtime medication: Nexium, losartan, metoprolol, amlodipine, clonidine, calcium  RESPIRATORY DATA: Apnea hypopnea index (AHI) 28.6 per hour. 61 total events scored including 2 obstructive apneas and 59 hypopneas. All events were associated with supine sleep position. CPAP was titrated to 7 CWP, AHI 0 per hour. She wore a small nasal mask.  OXYGEN DATA: Loud snoring before CPAP with oxygen desaturation to a nadir of 80% on room air. With CPAP control, snoring was prevented and mean oxygen saturation of 93.6% on room air.  CARDIAC DATA: Sinus rhythm with occasional PVC  MOVEMENT/PARASOMNIA: No significant movement disturbance, bathroom x1  IMPRESSION/ RECOMMENDATION:   1) This study was performed as a daytime study to correspond to the patient's normal sleep schedule. Lights out at 10:08 a.m. and lights on at 1608 p.m. 2) Moderate obstructive sleep apnea/hypopnea syndrome, AHI 28.6 per hour with supine events. Very loud snoring with oxygen desaturation to a nadir of 88% on room air.  3) Successful CPAP titration to 7 CWP, AHI 0 per hour. She wore a small [employer] Eson nasal mask with heated humidifier. Snoring was  prevented and mean oxygen saturation held 93.6% on room air with CPAP.  Signs Baird Lyons M.D. Deneise Lever Diplomate, American Board of Sleep Medicine  ELECTRONICALLY SIGNED ON:  06/17/2013, 12:27 PM [place] PH: [phone]   FX: [phone] [place]

## 2013-06-22 ENCOUNTER — Ambulatory Visit (HOSPITAL_COMMUNITY): Admission: RE | Admit: 2013-06-22 | Payer: 59 | Source: Ambulatory Visit

## 2013-06-22 ENCOUNTER — Ambulatory Visit (HOSPITAL_COMMUNITY)
Admission: RE | Admit: 2013-06-22 | Discharge: 2013-06-22 | Disposition: A | Discharge status: Home / Self Care | Payer: 59 | Source: Ambulatory Visit | Attending: Neurology | Admitting: Neurology

## 2013-06-22 DIAGNOSIS — R55 Syncope and collapse: Secondary | ICD-10-CM | POA: Insufficient documentation

## 2013-06-22 DIAGNOSIS — R42 Dizziness and giddiness: Secondary | ICD-10-CM

## 2013-06-22 DIAGNOSIS — R51 Headache: Secondary | ICD-10-CM

## 2013-06-22 DIAGNOSIS — I10 Essential (primary) hypertension: Secondary | ICD-10-CM | POA: Insufficient documentation

## 2013-06-22 DIAGNOSIS — R400 Somnolence: Secondary | ICD-10-CM

## 2013-06-22 DIAGNOSIS — M542 Cervicalgia: Secondary | ICD-10-CM | POA: Insufficient documentation

## 2013-06-22 DIAGNOSIS — G4710 Hypersomnia, unspecified: Secondary | ICD-10-CM

## 2013-06-22 LAB — CREATININE, SERUM
Creatinine, Ser: 1.08 mg/dL (ref 0.50–1.10)
GFR calc non Af Amer: 56 mL/min — ABNORMAL LOW (ref >90)
GFR, EST AFRICAN AMERICAN: 65 mL/min — AB (ref >90)

## 2013-06-22 MED ORDER — GADOBENATE DIMEGLUMINE 529 MG/ML IV SOLN
20.0000 mL | Freq: Once | INTRAVENOUS | Status: AC
  Administered 2013-06-22: 20 mL via INTRAVENOUS

## 2013-06-28 ENCOUNTER — Ambulatory Visit (INDEPENDENT_AMBULATORY_CARE_PROVIDER_SITE_OTHER): Payer: 59 | Admitting: Neurology

## 2013-06-28 ENCOUNTER — Encounter: Payer: Self-pay | Admitting: Neurology

## 2013-06-28 VITALS — BP 150/90 | HR 76 | Ht 68.0 in | Wt 216.5 lb

## 2013-06-28 DIAGNOSIS — J988 Other specified respiratory disorders: Secondary | ICD-10-CM | POA: Insufficient documentation

## 2013-06-28 DIAGNOSIS — G4733 Obstructive sleep apnea (adult) (pediatric): Secondary | ICD-10-CM

## 2013-06-28 NOTE — Progress Notes (Signed)
NEUROLOGY FOLLOW UP OFFICE NOTE  [name] [id number]  HISTORY OF PRESENT ILLNESS: I had the pleasure of seeing [name] in follow-up in the neurology clinic on 06/28/2013.  The patient was last seen last month for episodes of significant drowsiness after eating concentrated sweets.  Neurological evaluation is unremarkable. TSH and B12 normal. I personally reviewed MRI brain which did not show any acute changes. There is scattered patchy white matter changes bilaterally consistent with chronic microvascular disease.  She was also reporting left-sided neck pain, and MRI cervical spine does show degenerative changes with mild spinal stenosis and mild to moderate foraminal narrowing at several levels, more on the left.  She has had a sleep study which showed moderate obstructive sleep apnea/hypopnea syndrome, AHI 28.6 per hour with supine events. Very loud snoring with oxygen desaturation to a nadir of 88% on room air. She had a successful CPAP titration to 7 CWP, AHI 0 per hour. She wore a small [employer] Eson nasal mask with heated humidifier. Snoring was prevented and mean oxygen saturation held 93.6% on room air with CPAP.   She denies any further similar spells as she has been avoiding concentrated sweets.  She does have chronic sinusitis and MRI brain did show polypoid opacification in the inferior aspect left maxillary sinus.  HPI:  This is a [name] 58 yo RH woman withoman with a history of hypertension and migraines, in her usual state of health until March 2015 when she started having significant drowsiness after eating concentrated sweets. She noticed that 30-60 minutes after eating a honeybun or cinnamon melts, she could not keep her head up, "unreasonably sleepy." A month ago she ate an apple crisp from [employer] and kept falling asleep at stoplights. She eventually made it to her house and recalls opening the garage door, then woke up in her car 2 hours later. She was  staggering as she got out of the car, and had a hard time getting the car door open. She reports having her glucose levels checked 2 weeks ago pre- and post-ingestion of an apple crisp, she started having similar significant drowsiness, dizziness, however her glucose levels apparently did not change significantly (89 to 93, per patient). She had an EKG at that time, which was reported as normal. Vital signs showed pulse of 53, BP lying down 166/86, sitting 180/100, standing 170/108. This was the last episode, she has avoided concentrated sweets since then. She reports having dizzy spells daily lasting for an hour, occurring even without ingestion of concentrated sweets. She was in the grocery store one time and felt dizzy, "like a fog in my head," hot, and feeling as if she would pass out. She denied any palpitations, chest pain, or shortness of breath. At times the dizzy episodes are associated with a slight headache. She has headaches 1-2 times a week, with frontal 5/10 pressure-like pain with mild nausea, photo and phonophobia, no vomiting. Excedrin helps with the headaches. She had some nasal drainage recently and was diagnosed with a sinus infection. She has also been waking up with left-sided neck pain despite trying different pillows.   PAST MEDICAL HISTORY: Past Medical History  Diagnosis Date  . Hypertension   . GERD (gastroesophageal reflux disease)   . Lactose intolerance   . MVP (mitral valve prolapse)   . Anxiety   . Pyelonephritis   . Anemia     as a child    MEDICATIONS: Current Outpatient Prescriptions on File Prior to Visit  Medication Sig Dispense Refill  . amLODipine (NORVASC) 5 MG tablet Take 5 mg by mouth daily.      Marland Kitchen azithromycin (ZITHROMAX Z-PAK) 250 MG tablet Take 1 tablet (250 mg total) by mouth daily. 500mg  PO day 1, then 250mg  PO days 205  6 tablet  0  . Calcium-Magnesium-Vitamin D (CALCIUM 1200+D3) 600-40-500 MG-MG-UNIT TB24 Take 3 tablets by mouth daily.      .  cloNIDine (CATAPRES) 0.1 MG tablet Take 0.1 mg by mouth 2 (two) times daily.       . diazepam (VALIUM) 5 MG tablet Take one tablet(5mg ) one hour prior to imaging take second tablet before imagine if needed  2 tablet  0  . esomeprazole (NEXIUM) 40 MG capsule Take 40 mg by mouth daily at 12 noon.      . fexofenadine (ALLEGRA) 180 MG tablet Take 180 mg by mouth daily.      Marland Kitchen ibuprofen (ADVIL,MOTRIN) 800 MG tablet Take 800 mg by mouth every 8 (eight) hours as needed for moderate pain.      Marland Kitchen losartan (COZAAR) 100 MG tablet Take 100 mg by mouth daily.      . metoprolol succinate (TOPROL-XL) 50 MG 24 hr tablet Take 50 mg by mouth daily. Take with or immediately following a meal.      . mometasone-formoterol (DULERA) 100-5 MCG/ACT AERO Inhale 2 puffs into the lungs daily as needed for wheezing or shortness of breath.      Marland Kitchen oxymetazoline (AFRIN) 0.05 % nasal spray Place 1 spray into both nostrils daily as needed for congestion.       No current facility-administered medications on file prior to visit.    ALLERGIES: Allergies  Allergen Reactions  . Crab [Shellfish Allergy] Anaphylaxis  . Ivp Dye [Iodinated Diagnostic Agents] Anaphylaxis  . Nizatidine Anaphylaxis  . Dilaudid [Hydromorphone Hcl] Nausea And Vomiting  . Hydrocodone-Acetaminophen Nausea And Vomiting  . Oxycodone-Acetaminophen Nausea And Vomiting    FAMILY HISTORY: Family History  Problem Relation Age of Onset  . Cancer Other   . Hypertension Other   . Hyperlipidemia Other   . Stroke Other   . Heart attack Other   . Diabetes Maternal Grandmother   . Ovarian cancer Paternal Grandmother   . Prostate cancer Paternal Grandfather   . Ovarian cancer Paternal Aunt   . Breast cancer Maternal Aunt     post menopause  . Breast cancer Other     1st cousin, mastectomy  . Hypertension Mother   . Heart disease Mother   . Heart disease Father   . Ulcers Brother   . Goiter Mother   . Other Brother     polio  . Pyelonephritis  Daughter     SOCIAL HISTORY: History   Social History  . Marital Status: Married    Spouse Name: N/A    Number of Children: N/A  . Years of Education: N/A   Occupational History  . Not on file.   Social History Main Topics  . Smoking status: Never Smoker   . Smokeless tobacco: Never Used  . Alcohol Use: No  . Drug Use: No  . Sexual Activity: Yes    Partners: Male   Other Topics Concern  . Not on file   Social History Narrative  . No narrative on file    REVIEW OF SYSTEMS: Constitutional: No fevers, chills, or sweats, no generalized fatigue, change in appetite Eyes: No visual changes, double vision, eye pain Ear, nose and throat: No hearing loss,  ear pain, nasal congestion, sore throat Cardiovascular: No chest pain, palpitations Respiratory:  No shortness of breath at rest or with exertion, wheezes GastrointestinaI: No nausea, vomiting, diarrhea, abdominal pain, fecal incontinence Genitourinary:  No dysuria, urinary retention or frequency Musculoskeletal:  + neck pain, no back pain Integumentary: No rash, pruritus, skin lesions Neurological: as above Psychiatric: No depression, insomnia, anxiety Endocrine: No palpitations, fatigue, diaphoresis, mood swings, change in appetite, change in weight, increased thirst Hematologic/Lymphatic:  No anemia, purpura, petechiae. Allergic/Immunologic: no itchy/runny eyes, nasal congestion, recent allergic reactions, rashes  PHYSICAL EXAM: Filed Vitals:   06/28/13 0925  BP: 150/90  Pulse: 76   General: No acute distress Head:  Normocephalic/atraumatic Neck: supple, no paraspinal tenderness, full range of motion Heart:  Regular rate and rhythm Lungs:  Clear to auscultation bilaterally Back: No paraspinal tenderness Skin/Extremities: No rash, no edema Neurological Exam: alert and oriented to person, place, and time. No aphasia or dysarthria. Fund of knowledge is appropriate.  Recent and remote memory are intact.  Attention and  concentration are normal.    Able to name objects and repeat phrases. Cranial nerves: Pupils equal, round, reactive to light.  Fundoscopic exam unremarkable, no papilledema. Extraocular movements intact with no nystagmus. Visual fields full. Facial sensation intact. No facial asymmetry. Tongue, uvula, palate midline.  Motor: Bulk and tone normal, muscle strength 5/5 throughout with no pronator drift.  Sensation to light touch, temperature and vibration intact.  No extinction to double simultaneous stimulation.  Deep tendon reflexes 2+ throughout, toes downgoing.  Finger to nose testing intact.  Gait narrow-based and steady, able to tandem walk adequately.  Romberg negative.  IMPRESSION: This is a [place] 58 yo RH woman with a history of hypertension and migraines, with episodes of excessive drowsiness after ingestion of concentrated sweets. At one point she woke up 2 hours later in her car and was staggering getting out of the car. Her neurological exam is non-focal with mild length-dependent neuropathy. MRI brain unremarkable.  Her sleep study showed moderate obstructive sleep apnea, with successful CPAP titration.  She will be referred to Sleep Medicine for OSA and CPAP initiation.  We discussed her symptoms, which are suggestive of metabolic rather than neurologic etiology.  It is possible the sleep apnea causes some daytime drowsiness that is worsened after the eats concentrated sweets.  She will continue avoidance of triggers.  Her MRI C-spine shows degenerative changes more on the left, she will be referred for physical therapy, she may benefit from massage therapy as well.  She will follow-up on a prn basis and knows to call our office for any problems.  Thank you for allowing me to participate in her care.  Please do not hesitate to call for any questions or concerns.  The duration of this appointment visit was 15 minutes of face-to-face time with the patient.  Greater than 50% of this time was spent  in counseling, explanation of diagnosis, planning of further management, and coordination of care.   Ellouise Newer, M.D.   CC: Dr. Inda Merlin

## 2013-06-28 NOTE — Patient Instructions (Addendum)
1. Refer to Dr. Baird Lyons for sleep apnea and CPAP initiation 2. Continue avoidance of triggers 3. Physical therapy for neck pain 4. Physical and brain stimulation exercises for brain health 5. Call our office for any problems

## 2013-07-28 ENCOUNTER — Institutional Professional Consult (permissible substitution): Payer: 59 | Admitting: Pulmonary Disease

## 2013-08-16 ENCOUNTER — Ambulatory Visit (INDEPENDENT_AMBULATORY_CARE_PROVIDER_SITE_OTHER): Payer: 59 | Admitting: Pulmonary Disease

## 2013-08-16 ENCOUNTER — Encounter: Payer: Self-pay | Admitting: Pulmonary Disease

## 2013-08-16 VITALS — BP 124/70 | HR 70 | Temp 98.0°F | Ht 68.5 in | Wt 222.4 lb

## 2013-08-16 DIAGNOSIS — G4733 Obstructive sleep apnea (adult) (pediatric): Secondary | ICD-10-CM

## 2013-08-16 NOTE — Assessment & Plan Note (Addendum)
The pathophysiology of obstructive sleep apnea , it's cardiovascular consequences & modes of treatment including CPAP were discused with the patient in detail & they evidenced understanding. She has a  CPAP machine already that she is obtained from a family member.this appears to be in good condition and she has nasal pillows. We will set you up with a DME company to set your CPAP at 7 cm Trial of nasal pillows with chin strap since she has claustrophobia and is a mouth breather Download report prior to next visit

## 2013-08-16 NOTE — Patient Instructions (Signed)
We will set you up with a DME company to set your CPAP at 7 cm Trial of nasal pillows with chin strap Download report prior to next visit

## 2013-08-16 NOTE — Progress Notes (Signed)
Subjective:    Patient ID: [name], female    DOB: July 10, 1955, 58 y.o.   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 She was everted by neurology for migraine headaches and syncopal episodes after eating concentrated sweets. MRI and EEG were nondiagnostic. Overnight polysomnogram (wt 222 lbs) showed AHI of 28 events per hour, predominantly when supine, with lowest desaturation of 88%. This was corrected by nasal CPAP of 7 cm, she reports claustrophobia with a nasal mask. Husband has noted loud snoring. Epworth sleepiness score is 7, but she does report excessive daytime fatigue. Somnolence is worse after eating sweet foods. She works nights on weekends and Mondays. Other days, her bedtime is around 9 PM, sleep latency is minimal, she will sometimes read in bed, she sleeps on her right side with 2 pillows, reports 2-3 nocturnal awakenings including a bathroom visit and is out of bed by 7 AM with headaches in the morning. She takes her dog for a walk in the morning. She reports a 20 pound weight gain over the last 2 years and denies use of excessive caffeinated beverages. On her work days, she's able to sleep by 8 AM and wake up by 1 PM. There is no history suggestive of cataplexy, sleep paralysis or parasomnias She does have difficult to control hypertension requiring 4 medications.  Past Medical History  Diagnosis Date  . Hypertension   . GERD (gastroesophageal reflux disease)   . Lactose intolerance   . MVP (mitral valve prolapse)   . Anxiety   . Pyelonephritis   . Anemia     as a child    Past Surgical History  Procedure Laterality Date  . Appendectomy    . Cholecystectomy    . Dilation and curettage of uterus    . Ankle fracture  10/09    bilateral-right was surgically repaired  . Ankle surgery  10/10    screws removed  . Ankle surgery  2/12    removal of all hardware right ankle  . Arm surgery       plate in left arm  . Breast lumpectomy      right breast    History   Social History  . Marital Status: Married    Spouse Name: N/A    Number of Children: 2  . Years of Education: N/A   Occupational History  .  [employer]   Social History Main Topics  . Smoking status: Never Smoker   . Smokeless tobacco: Never Used  . Alcohol Use: No  . Drug Use: No  . Sexual Activity: Yes    Partners: Male   Other Topics Concern  . Not on file   Social History Narrative  . No narrative on file    Family History  Problem Relation Age of Onset  . Diabetes Maternal Grandmother   . Ovarian cancer Paternal Grandmother   . Prostate cancer Paternal Grandfather   . Ovarian cancer Paternal Aunt   . Breast cancer Maternal Aunt     post menopause  . Breast cancer Other     3 1st cousins, mastectomy  . Hypertension Mother   . Heart disease Mother   . Heart disease Father   . Ulcers Brother   . Goiter Mother   . Other Brother     polio  . Pyelonephritis Daughter   . Breast cancer Sister      Review of Systems  Constitutional:  Negative for fever and unexpected weight change.  HENT: Negative for congestion, dental problem, ear pain, nosebleeds, postnasal drip, rhinorrhea, sinus pressure, sneezing, sore throat and trouble swallowing.   Eyes: Negative for redness and itching.  Respiratory: Negative for cough, chest tightness, shortness of breath and wheezing.   Cardiovascular: Negative for palpitations and leg swelling.  Gastrointestinal: Negative for nausea and vomiting.  Genitourinary: Negative for dysuria.  Musculoskeletal: Negative for joint swelling.  Skin: Negative for rash.  Neurological: Negative for headaches.  Hematological: Does not bruise/bleed easily.  Psychiatric/Behavioral: Negative for dysphoric mood. The patient is not nervous/anxious.        Objective:   Physical Exam Gen. Leugers, well-nourished, in no distress, normal affect ENT - no lesions, no post  nasal drip Neck: No JVD, no thyromegaly, no carotid bruits Lungs: no use of accessory muscles, no dullness to percussion, clear without rales or rhonchi  Cardiovascular: Rhythm regular, heart sounds  normal, no murmurs or gallops, no peripheral edema Abdomen: soft and non-tender, no hepatosplenomegaly, BS normal. Musculoskeletal: No deformities, no cyanosis or clubbing Neuro:  alert, non focal        Assessment & Plan:

## 2013-10-03 ENCOUNTER — Telehealth: Payer: Self-pay | Admitting: Pulmonary Disease

## 2013-10-03 ENCOUNTER — Ambulatory Visit (INDEPENDENT_AMBULATORY_CARE_PROVIDER_SITE_OTHER): Payer: 59 | Admitting: Pulmonary Disease

## 2013-10-03 ENCOUNTER — Encounter: Payer: Self-pay | Admitting: Pulmonary Disease

## 2013-10-03 VITALS — BP 130/80 | HR 60 | Temp 97.0°F | Ht 68.0 in | Wt 232.0 lb

## 2013-10-03 DIAGNOSIS — G4733 Obstructive sleep apnea (adult) (pediatric): Secondary | ICD-10-CM

## 2013-10-03 NOTE — Assessment & Plan Note (Signed)
CPAP 7 cm is very effective New nasal mask Rx will be sent Weight loss encouraged, compliance with goal of at least 4-6 hrs every night is the expectation. Caution against driving when sleepy - understanding that sleepiness will vary on a day to day basis

## 2013-10-03 NOTE — Telephone Encounter (Signed)
i called and spoke with pt and she is aware that Lenna Sciara will get the rx and get this taken care of for the pt.  Nothing further is needed.

## 2013-10-03 NOTE — Telephone Encounter (Signed)
LMTCB

## 2013-10-03 NOTE — Progress Notes (Signed)
   Subjective:    Patient ID: [name], female    DOB: 03/05/1955, 58 y.o.   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 MRI and EEG were nondiagnostic. She does have difficult to control hypertension requiring 4 medications. PSG (wt 222 lbs) showed AHI of 28 events per hour, predominantly when supine, with lowest desaturation of 88%. This was corrected by nasal CPAP of 7 cm, she reports claustrophobia with a nasal mask.   Chief Complaint  Patient presents with  . Follow-up    Sleep Apnea, has CPAP through [facility], no download available, only used since august 2015;uses CPAP 4-8 hours per night.      On her work days, she's able to sleep by 8 AM and wake up by 1 PM.   Download reviewed 09/2013 >> no residuals, 7 cm very effective, usage scattered plot due to night shift work Nasal pillows cause some burning, she would like nasal mas but had some pushback from DME Does feel better rested, more energy, no snoring     Review of Systems neg for any significant sore throat, dysphagia, itching, sneezing, nasal congestion or excess/ purulent secretions, fever, chills, sweats, unintended wt loss, pleuritic or exertional cp, hempoptysis, orthopnea pnd or change in chronic leg swelling. Also denies presyncope, palpitations, heartburn, abdominal pain, nausea, vomiting, diarrhea or change in bowel or urinary habits, dysuria,hematuria, rash, arthralgias, visual complaints, headache, numbness weakness or ataxia.     Objective:   Physical Exam  Gen. [name], obese, in no distress ENT - no lesions, no post nasal drip Neck: No JVD, no thyromegaly, no carotid bruits Lungs: no use of accessory muscles, no dullness to percussion, decreased without rales or rhonchi  Cardiovascular: Rhythm regular, heart sounds  normal, no murmurs or gallops, no  peripheral edema Musculoskeletal: No deformities, no cyanosis or clubbing , no tremors        Assessment & Plan:

## 2013-10-03 NOTE — Patient Instructions (Signed)
CPAP 7 cm is very effective New nasal mask Rx will be sent Weight loss encouraged, compliance with goal of at least 4-6 hrs every night is the expectation. Caution against driving when sleepy - understanding that sleepiness will vary on a day to day basis

## 2013-10-04 NOTE — Telephone Encounter (Signed)
Jonni Sanger called back-states he is unsure what we need from them on the patient. Looks as though Mathis Dad requested a download on patient prior to her OV with RA on 10-03-13. Pt has only had her machine from a family member since 08-2013; they can get in touch with patient if RA wants a 30 day download now.   RA please advise or did you get a download on patient at her 10-03-13 visit via her card in her CPAP machine. Thanks.

## 2013-10-04 NOTE — Telephone Encounter (Signed)
Do not need download- I got that from her machine She needs new nasal mask

## 2013-10-04 NOTE — Telephone Encounter (Signed)
Order was sent to [facility] for new nasal CPAP mask  Nothing further needed

## 2013-10-04 NOTE — Telephone Encounter (Signed)
Spoke with Judeen Hammans at [facility] and she states that she is being told by Linzie Collin [phone] ext [phone]) that they cannot obtain a download because they did not provide the patient with the original machine .  Pt got machine from a family member.  However [facility] has been getting cpap supplies for pt.  LM for Linzie Collin to call to discuss getting download.

## 2013-10-17 ENCOUNTER — Encounter (HOSPITAL_COMMUNITY): Payer: Self-pay | Admitting: Emergency Medicine

## 2013-10-17 ENCOUNTER — Other Ambulatory Visit: Payer: Self-pay | Admitting: Physician Assistant

## 2013-10-17 ENCOUNTER — Emergency Department (HOSPITAL_COMMUNITY): Payer: 59

## 2013-10-17 ENCOUNTER — Observation Stay (HOSPITAL_COMMUNITY)
Admission: EM | Admit: 2013-10-17 | Discharge: 2013-10-17 | Disposition: A | Discharge status: Home / Self Care | Payer: 59 | Attending: Internal Medicine | Admitting: Internal Medicine

## 2013-10-17 DIAGNOSIS — M791 Myalgia: Secondary | ICD-10-CM | POA: Insufficient documentation

## 2013-10-17 DIAGNOSIS — Z8249 Family history of ischemic heart disease and other diseases of the circulatory system: Secondary | ICD-10-CM

## 2013-10-17 DIAGNOSIS — F419 Anxiety disorder, unspecified: Secondary | ICD-10-CM | POA: Diagnosis not present

## 2013-10-17 DIAGNOSIS — M79602 Pain in left arm: Secondary | ICD-10-CM | POA: Diagnosis not present

## 2013-10-17 DIAGNOSIS — N12 Tubulo-interstitial nephritis, not specified as acute or chronic: Secondary | ICD-10-CM | POA: Diagnosis not present

## 2013-10-17 DIAGNOSIS — M7989 Other specified soft tissue disorders: Secondary | ICD-10-CM | POA: Diagnosis not present

## 2013-10-17 DIAGNOSIS — Z79899 Other long term (current) drug therapy: Secondary | ICD-10-CM | POA: Insufficient documentation

## 2013-10-17 DIAGNOSIS — I10 Essential (primary) hypertension: Secondary | ICD-10-CM | POA: Insufficient documentation

## 2013-10-17 DIAGNOSIS — E739 Lactose intolerance, unspecified: Secondary | ICD-10-CM | POA: Insufficient documentation

## 2013-10-17 DIAGNOSIS — I341 Nonrheumatic mitral (valve) prolapse: Secondary | ICD-10-CM | POA: Diagnosis not present

## 2013-10-17 DIAGNOSIS — K219 Gastro-esophageal reflux disease without esophagitis: Secondary | ICD-10-CM | POA: Diagnosis not present

## 2013-10-17 DIAGNOSIS — R079 Chest pain, unspecified: Secondary | ICD-10-CM | POA: Diagnosis present

## 2013-10-17 DIAGNOSIS — I209 Angina pectoris, unspecified: Secondary | ICD-10-CM | POA: Diagnosis not present

## 2013-10-17 DIAGNOSIS — I517 Cardiomegaly: Secondary | ICD-10-CM

## 2013-10-17 DIAGNOSIS — D649 Anemia, unspecified: Secondary | ICD-10-CM | POA: Diagnosis not present

## 2013-10-17 LAB — BASIC METABOLIC PANEL
Anion gap: 14 (ref 5–15)
BUN: 16 mg/dL (ref 6–23)
CO2: 24 mEq/L (ref 19–32)
Calcium: 9.4 mg/dL (ref 8.4–10.5)
Chloride: 104 mEq/L (ref 96–112)
Creatinine, Ser: 1.03 mg/dL (ref 0.50–1.10)
GFR calc Af Amer: 69 mL/min — ABNORMAL LOW (ref >90)
GFR calc non Af Amer: 60 mL/min — ABNORMAL LOW (ref >90)
GLUCOSE: 92 mg/dL (ref 70–99)
Potassium: 4.2 mEq/L (ref 3.7–5.3)
Sodium: 142 mEq/L (ref 137–147)

## 2013-10-17 LAB — LIPID PANEL
Cholesterol: 172 mg/dL (ref 0–200)
HDL: 35 mg/dL — ABNORMAL LOW (ref >39)
LDL CALC: 124 mg/dL — AB (ref 0–99)
Total CHOL/HDL Ratio: 4.9 RATIO
Triglycerides: 66 mg/dL (ref <150)
VLDL: 13 mg/dL (ref 0–40)

## 2013-10-17 LAB — I-STAT TROPONIN, ED: Troponin i, poc: 0 ng/mL (ref 0.00–0.08)

## 2013-10-17 LAB — HEMOGLOBIN A1C
HEMOGLOBIN A1C: 5.5 % (ref <5.7)
MEAN PLASMA GLUCOSE: 111 mg/dL (ref <117)

## 2013-10-17 LAB — TROPONIN I

## 2013-10-17 LAB — CBC
HCT: 37.8 % (ref 36.0–46.0)
HEMOGLOBIN: 12.8 g/dL (ref 12.0–15.0)
MCH: 28.3 pg (ref 26.0–34.0)
MCHC: 33.9 g/dL (ref 30.0–36.0)
MCV: 83.6 fL (ref 78.0–100.0)
Platelets: 284 10*3/uL (ref 150–400)
RBC: 4.52 MIL/uL (ref 3.87–5.11)
RDW: 14.2 % (ref 11.5–15.5)
WBC: 11.3 10*3/uL — ABNORMAL HIGH (ref 4.0–10.5)

## 2013-10-17 LAB — HEPARIN LEVEL (UNFRACTIONATED): Heparin Unfractionated: 0.53 IU/mL (ref 0.30–0.70)

## 2013-10-17 LAB — HEPATIC FUNCTION PANEL
ALBUMIN: 3.8 g/dL (ref 3.5–5.2)
ALT: 31 U/L (ref 0–35)
AST: 22 U/L (ref 0–37)
Alkaline Phosphatase: 122 U/L — ABNORMAL HIGH (ref 39–117)
Bilirubin, Direct: <0.2 mg/dL (ref 0.0–0.3)
Total Bilirubin: <0.2 mg/dL — ABNORMAL LOW (ref 0.3–1.2)
Total Protein: 7.7 g/dL (ref 6.0–8.3)

## 2013-10-17 LAB — TSH: TSH: 1.66 u[IU]/mL (ref 0.350–4.500)

## 2013-10-17 LAB — PRO B NATRIURETIC PEPTIDE: PRO B NATRI PEPTIDE: 17.1 pg/mL (ref 0–125)

## 2013-10-17 LAB — D-DIMER, QUANTITATIVE: D-Dimer, Quant: 0.43 ug/mL-FEU (ref 0.00–0.48)

## 2013-10-17 LAB — MAGNESIUM: Magnesium: 2.1 mg/dL (ref 1.5–2.5)

## 2013-10-17 MED ORDER — ASPIRIN 81 MG PO CHEW
324.0000 mg | CHEWABLE_TABLET | Freq: Once | ORAL | Status: AC
  Administered 2013-10-17: 324 mg via ORAL
  Filled 2013-10-17: qty 4

## 2013-10-17 MED ORDER — ESOMEPRAZOLE MAGNESIUM 40 MG PO CPDR: 40.0000 mg | DELAYED_RELEASE_CAPSULE | Freq: Every day | ORAL | Status: DC

## 2013-10-17 MED ORDER — REGADENOSON 0.4 MG/5ML IV SOLN
0.4000 mg | Freq: Once | INTRAVENOUS | Status: DC
  Filled 2013-10-17: qty 5

## 2013-10-17 MED ORDER — ASPIRIN 81 MG PO CHEW: 324.0000 mg | CHEWABLE_TABLET | Freq: Every day | ORAL | Status: DC

## 2013-10-17 MED ORDER — NITROGLYCERIN 0.4 MG SL SUBL: 0.4000 mg | SUBLINGUAL_TABLET | SUBLINGUAL | Status: AC | PRN

## 2013-10-17 MED ORDER — MORPHINE SULFATE 2 MG/ML IJ SOLN: 0.5000 mg | INTRAMUSCULAR | Status: DC | PRN

## 2013-10-17 MED ORDER — HEPARIN (PORCINE) IN NACL 100-0.45 UNIT/ML-% IJ SOLN
1250.0000 [IU]/h | INTRAMUSCULAR | Status: DC
  Administered 2013-10-17: 1250 [IU]/h via INTRAVENOUS
  Filled 2013-10-17 (×2): qty 250

## 2013-10-17 MED ORDER — NITROGLYCERIN 0.4 MG SL SUBL
0.4000 mg | SUBLINGUAL_TABLET | SUBLINGUAL | Status: DC | PRN
  Administered 2013-10-17: 0.4 mg via SUBLINGUAL
  Filled 2013-10-17: qty 1

## 2013-10-17 MED ORDER — METOPROLOL SUCCINATE ER 25 MG PO TB24
25.0000 mg | ORAL_TABLET | Freq: Every day | ORAL | Status: DC
  Administered 2013-10-17: 25 mg via ORAL
  Filled 2013-10-17: qty 1

## 2013-10-17 MED ORDER — PANTOPRAZOLE SODIUM 40 MG PO TBEC: 40.0000 mg | DELAYED_RELEASE_TABLET | Freq: Every day | ORAL | Status: DC

## 2013-10-17 MED ORDER — ONDANSETRON HCL 4 MG PO TABS: 4.0000 mg | ORAL_TABLET | Freq: Four times a day (QID) | ORAL | Status: DC | PRN

## 2013-10-17 MED ORDER — NITROGLYCERIN 2 % TD OINT
0.5000 [in_us] | TOPICAL_OINTMENT | Freq: Four times a day (QID) | TRANSDERMAL | Status: DC
  Administered 2013-10-17 (×2): 0.5 [in_us] via TOPICAL
  Filled 2013-10-17: qty 30

## 2013-10-17 MED ORDER — SODIUM CHLORIDE 0.9 % IJ SOLN
3.0000 mL | Freq: Two times a day (BID) | INTRAMUSCULAR | Status: DC
Start: 2013-10-17 — End: 2013-10-17
  Administered 2013-10-17: 3 mL via INTRAVENOUS

## 2013-10-17 MED ORDER — AMLODIPINE BESYLATE 5 MG PO TABS
5.0000 mg | ORAL_TABLET | Freq: Every day | ORAL | Status: DC
  Administered 2013-10-17: 5 mg via ORAL
  Filled 2013-10-17: qty 1

## 2013-10-17 MED ORDER — HEPARIN BOLUS VIA INFUSION
4000.0000 [IU] | Freq: Once | INTRAVENOUS | Status: AC
  Administered 2013-10-17: 4000 [IU] via INTRAVENOUS
  Filled 2013-10-17: qty 4000

## 2013-10-17 MED ORDER — ONDANSETRON HCL 4 MG/2ML IJ SOLN: 4.0000 mg | Freq: Four times a day (QID) | INTRAMUSCULAR | Status: DC | PRN

## 2013-10-17 MED ORDER — NON FORMULARY: 40.0000 mg | Freq: Every day | Status: DC

## 2013-10-17 MED ORDER — LEVALBUTEROL HCL 0.63 MG/3ML IN NEBU
0.6300 mg | INHALATION_SOLUTION | Freq: Four times a day (QID) | RESPIRATORY_TRACT | Status: DC | PRN
  Filled 2013-10-17: qty 3

## 2013-10-17 NOTE — Discharge Instructions (Signed)
Chest Pain Observation  It is often hard to give a specific diagnosis for the cause of chest pain. Among other possibilities your symptoms might be caused by inadequate oxygen delivery to your heart (angina). Angina that is not treated or evaluated can lead to a heart attack (myocardial infarction) or death.  Blood tests, electrocardiograms, and X-rays may have been done to help determine a possible cause of your chest pain. After evaluation and observation, your health care provider has determined that it is unlikely your pain was caused by an unstable condition that requires hospitalization. However, a full evaluation of your pain may need to be completed, with additional diagnostic testing as directed. It is very important to keep your follow-up appointments. Not keeping your follow-up appointments could result in permanent heart damage, disability, or death. If there is any problem keeping your follow-up appointments, you must call your health care provider.  HOME CARE INSTRUCTIONS   Due to the slight chance that your pain could be angina, it is important to follow your health care provider's treatment plan and also maintain a healthy lifestyle:  · Maintain or work toward achieving a healthy weight.  · Stay physically active and exercise regularly.  · Decrease your salt intake.  · Eat a balanced, healthy diet. Talk to a dietitian to learn about heart-healthy foods.  · Increase your fiber intake by including whole grains, vegetables, fruits, and nuts in your diet.  · Avoid situations that cause stress, anger, or depression.  · Take medicines as advised by your health care provider. Report any side effects to your health care provider. Do not stop medicines or adjust the dosages on your own.  · Quit smoking. Do not use nicotine patches or gum until you check with your health care provider.  · Keep your blood pressure, blood sugar, and cholesterol levels within normal limits.  · Limit alcohol intake to no more than  1 drink per day for women who are not pregnant and 2 drinks per day for men.  · Do not abuse drugs.  SEEK IMMEDIATE MEDICAL CARE IF:  You have severe chest pain or pressure which may include symptoms such as:  · You feel pain or pressure in your arms, neck, jaw, or back.  · You have severe back or abdominal pain, feel sick to your stomach (nauseous), or throw up (vomit).  · You are sweating profusely.  · You are having a fast or irregular heartbeat.  · You feel short of breath while at rest.  · You notice increasing shortness of breath during rest, sleep, or with activity.  · You have chest pain that does not get better after rest or after taking your usual medicine.  · You wake from sleep with chest pain.  · You are unable to sleep because you cannot breathe.  · You develop a frequent cough or you are coughing up blood.  · You feel dizzy, faint, or experience extreme fatigue.  · You develop severe weakness, dizziness, fainting, or chills.  Any of these symptoms may represent a serious problem that is an emergency. Do not wait to see if the symptoms will go away. Call your local emergency services (911 in the [place] Do not drive yourself to the hospital.  MAKE SURE YOU:  · Understand these instructions.  · Will watch your condition.  · Will get help right away if you are not doing well or get worse.  Document Released: 01/24/2010 Document Revised: 12/27/2012 Document Reviewed: 06/23/2012    ExitCare® Patient Information ©2015 [employer]. This information is not intended to replace advice given to you by your health care provider. Make sure you discuss any questions you have with your health care provider.

## 2013-10-17 NOTE — Discharge Summary (Signed)
Physician Discharge Summary  [name] MRN: [id number] DOB/AGE: 02-26-1955 58 y.o.  PCP: Henrine Screws, MD   Admit date: 10/17/2013 Discharge date: 10/17/2013  Discharge Diagnoses:  Hypertension Gastroesophageal reflux disease   Ischemic chest pain   Chest pain  Followup recommendations outpatient stress test on 10/14 at 7:30 am at the [address] location Followup with PCP in 5-7 days    Medication List    STOP taking these medications       ibuprofen 800 MG tablet  Commonly known as:  ADVIL,MOTRIN      TAKE these medications       amLODipine 5 MG tablet  Commonly known as:  NORVASC  Take 5 mg by mouth daily.     aspirin 81 MG chewable tablet  Chew 4 tablets (324 mg total) by mouth daily.  Start taking on:  10/18/2013     CALCIUM 1200+D3 600-40-500 MG-MG-UNIT Tb24  Generic drug:  Calcium-Magnesium-Vitamin D  Take 3 tablets by mouth daily.     cloNIDine 0.1 MG tablet  Commonly known as:  CATAPRES  Take 0.1 mg by mouth 2 (two) times daily.     esomeprazole 40 MG capsule  Commonly known as:  NEXIUM  Take 40 mg by mouth daily at 12 noon.     fexofenadine 180 MG tablet  Commonly known as:  ALLEGRA  Take 180 mg by mouth daily.     losartan 100 MG tablet  Commonly known as:  COZAAR  Take 100 mg by mouth daily.     metoprolol succinate 50 MG 24 hr tablet  Commonly known as:  TOPROL-XL  Take 25 mg by mouth daily. Take with or immediately following a meal.     nitroGLYCERIN 0.4 MG SL tablet  Commonly known as:  NITROSTAT  Place 1 tablet (0.4 mg total) under the tongue every 5 (five) minutes as needed for chest pain.        Discharge Condition: Stable Disposition: 01-Home or Self Care   Consults:  Cardiology  Significant Diagnostic Studies: Dg Chest 2 View  10/17/2013   CLINICAL DATA:  Chest pain.  Initial encounter.  EXAM: CHEST  2 VIEW  COMPARISON:  02/09/2013  FINDINGS: Normal heart size and mediastinal contours. No acute  infiltrate or edema. No effusion or pneumothorax. No osseous findings to explain chest pain. Cholecystectomy.  IMPRESSION: No active cardiopulmonary disease.   Electronically Signed   By: Jorje Guild M.D.   On: 10/17/2013 06:50   Echocardiogram done results pending  Microbiology: No results found for this or any previous visit (from the past 240 hour(s)).   Labs: Results for orders placed during the hospital encounter of 10/17/13 (from the past 48 hour(s))  CBC     Status: Abnormal   Collection Time    10/17/13  6:00 AM      Result Value Ref Range   WBC 11.3 (*) 4.0 - 10.5 K/uL   RBC 4.52  3.87 - 5.11 MIL/uL   Hemoglobin 12.8  12.0 - 15.0 g/dL   HCT 37.8  36.0 - 46.0 %   MCV 83.6  78.0 - 100.0 fL   MCH 28.3  26.0 - 34.0 pg   MCHC 33.9  30.0 - 36.0 g/dL   RDW 14.2  11.5 - 15.5 %   Platelets 284  150 - 400 K/uL  BASIC METABOLIC PANEL     Status: Abnormal   Collection Time    10/17/13  6:00 AM  Result Value Ref Range   Sodium 142  137 - 147 mEq/L   Potassium 4.2  3.7 - 5.3 mEq/L   Chloride 104  96 - 112 mEq/L   CO2 24  19 - 32 mEq/L   Glucose, Bld 92  70 - 99 mg/dL   BUN 16  6 - 23 mg/dL   Creatinine, Ser 1.03  0.50 - 1.10 mg/dL   Calcium 9.4  8.4 - 10.5 mg/dL   GFR calc non Af Amer 60 (*) >90 mL/min   GFR calc Af Amer 69 (*) >90 mL/min   Comment: (NOTE)     The eGFR has been calculated using the CKD EPI equation.     This calculation has not been validated in all clinical situations.     eGFR's persistently <90 mL/min signify possible Chronic Kidney     Disease.   Anion gap 14  5 - 15  D-DIMER, QUANTITATIVE     Status: None   Collection Time    10/17/13  6:00 AM      Result Value Ref Range   D-Dimer, Quant 0.43  0.00 - 0.48 ug/mL-FEU   Comment:            AT THE INHOUSE ESTABLISHED CUTOFF     VALUE OF 0.48 ug/mL FEU,     THIS ASSAY HAS BEEN DOCUMENTED     IN THE LITERATURE TO HAVE     A SENSITIVITY AND NEGATIVE     PREDICTIVE VALUE OF AT LEAST     98 TO  99%.  THE TEST RESULT     SHOULD BE CORRELATED WITH     AN ASSESSMENT OF THE CLINICAL     PROBABILITY OF DVT / VTE.  Randolm Idol, ED     Status: None   Collection Time    10/17/13  6:04 AM      Result Value Ref Range   Troponin i, poc 0.00  0.00 - 0.08 ng/mL   Comment 3            Comment: Due to the release kinetics of cTnI,     a negative result within the first hours     of the onset of symptoms does not rule out     myocardial infarction with certainty.     If myocardial infarction is still suspected,     repeat the test at appropriate intervals.  HEPATIC FUNCTION PANEL     Status: Abnormal   Collection Time    10/17/13  8:28 AM      Result Value Ref Range   Total Protein 7.7  6.0 - 8.3 g/dL   Albumin 3.8  3.5 - 5.2 g/dL   AST 22  0 - 37 U/L   ALT 31  0 - 35 U/L   Alkaline Phosphatase 122 (*) 39 - 117 U/L   Total Bilirubin <0.2 (*) 0.3 - 1.2 mg/dL   Bilirubin, Direct <0.2  0.0 - 0.3 mg/dL   Indirect Bilirubin NOT CALCULATED  0.3 - 0.9 mg/dL  MAGNESIUM     Status: None   Collection Time    10/17/13  8:28 AM      Result Value Ref Range   Magnesium 2.1  1.5 - 2.5 mg/dL  TSH     Status: None   Collection Time    10/17/13  8:28 AM      Result Value Ref Range   TSH 1.660  0.350 - 4.500 uIU/mL  TROPONIN I  Status: None   Collection Time    10/17/13  8:28 AM      Result Value Ref Range   Troponin I <0.30  <0.30 ng/mL   Comment:            Due to the release kinetics of cTnI,     a negative result within the first hours     of the onset of symptoms does not rule out     myocardial infarction with certainty.     If myocardial infarction is still suspected,     repeat the test at appropriate intervals.  PRO B NATRIURETIC PEPTIDE     Status: None   Collection Time    10/17/13  8:28 AM      Result Value Ref Range   Pro B Natriuretic peptide (BNP) 17.1  0 - 125 pg/mL     HPI : [name] is a 58 y.o. female with a history of GERD, HTN, obesity, mild  HLD and no past cardiac history who presented to [facility] ED today with chest pain.  She was seen in 2009 for chest pain and underwent LHC by Dr. Marlou Porch with no angiographically significant coronary artery disease, normal EF 65% with no RWMA. Aggressive medical management, especially with hypertension was recommended at that time.  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 Around 3 am she started noticing chest pain as well that she described as a pressure. It was constant and 8/10. She was given a NTG which relieved the chest pain but not the left arm pain. The left arm pain is constant and worse with palpation. No hx of exercise or trauma. Her CP was associated with diaphoresis but no SOB. The pressure was worse with exertion and better with rest. This pain was similar to the chest pain that brought her in in 2012 but it was not associated with L arm pain at that time.  She does not smoke or drink. She exercises 3 times a week by walking and does notice some SOB that she attributed to being overweight. Strong family hx of CAD in her sister ( MI @ 3yo) and mother (first event in 32s, died of MI in late 5s).  HOSPITAL COURSE: Chest pain felt worrisome for [place]  -- Troponin neg x2. ECG with no acute ST or TW changes.  -- D-dimer negative.  --Initiated on aspirin, heparin drip, beta blocker  -- HgA1c, lipid panel, 2-D echo pending.  -- Will wait for one more troponin to return and if negative she can be discharged tonight and has an outpatient stress test at 7:30 am at [address] location  Patient will be to go home per cardiology with outpatient stress test tomorrow  Hypertension -- slightly elevated upon arrival to ED at 181/101  -- Difficult to control HTN on 4 different meds. Renal arteries normal by cath in 2012  -- Continue on Norvasc 68m, Toprol XL 25 mg qd, clonidine 0.180mBID. Losartan 10039mo be continued  Gastroesophageal reflux  --  Continue Nexium     Discharge Exam:  Blood pressure 155/74, pulse 57, temperature 98.4 F (36.9 C), temperature source Oral, resp. rate 16, height 5' 8.5" (1.74 m), weight 103.42 kg (228 lb), last menstrual period 07/05/2009, SpO2 97.00%.  General: Well developed, well nourished, female in no acute distress. obese  Head: Eyes PERRLA, No xanthomas. Normocephalic and atraumatic, oropharynx without edema or exudate.  Lungs: CTAB  Heart: HRRR S1 S2, no rub/gallop, Heart irregular rate and rhythm with S1, S2 murmur. pulses are 2+ extrem.  Neck: No carotid bruits. No lymphadenopathy. No JVD.  Abdomen: Bowel sounds present, abdomen soft and non-tender without masses or hernias noted.  Msk: No spine or cva tenderness. No weakness, no joint deformities or effusions.  Extremities: No clubbing or cyanosis. Trace edema.  Neuro: Alert and oriented X 3. No focal deficits noted.  Psych: Good affect, responds appropriately  Skin: No rashes or lesions noted.          Follow-up Information   Follow up with Candee Furbish, MD On 10/18/2013. (You have cardiac stress test tomorrow AM at 7:30am. Please show up at 7:15am. Nothing to eat after midnight and skip your beta blocker tomorrow AM. )    Specialty:  Cardiology   Contact information:   [address] [place] [address] [phone]       Signed: Reyne Dumas 10/17/2013, 1:58 PM

## 2013-10-17 NOTE — Progress Notes (Signed)
*  PRELIMINARY RESULTS* Echocardiogram 2D Echocardiogram has been performed.  Leavy Cella 10/17/2013, 12:46 PM

## 2013-10-17 NOTE — Progress Notes (Signed)
Patient discharged to home with daughter, VSS. Patient verbalized understanding of discharge instructions. All belongings returned to patient.

## 2013-10-17 NOTE — ED Notes (Signed)
Report called to [address]. 

## 2013-10-17 NOTE — Progress Notes (Signed)
ANTICOAGULATION CONSULT NOTE - Initial Consult  Pharmacy Consult for heparin Indication: atrial fibrillation  Allergies  Allergen Reactions  . Crab [Shellfish Allergy] Anaphylaxis  . Ivp Dye [Iodinated Diagnostic Agents] Anaphylaxis  . Nizatidine Anaphylaxis  . Dilaudid [Hydromorphone Hcl] Nausea And Vomiting  . Hydrocodone-Acetaminophen Nausea And Vomiting  . Oxycodone-Acetaminophen Nausea And Vomiting    Patient Measurements: Height: 5' 8.5" (174 cm) Weight: 228 lb (103.42 kg) IBW/kg (Calculated) : 65.05 Heparin Dosing Weight: 90kg  Vital Signs: Temp: 98.1 F (36.7 C) (10/13 0548) Temp Source: Oral (10/13 0548) BP: 174/111 mmHg (10/13 0615) Pulse Rate: 63 (10/13 0615)  Labs:  Recent Labs  10/17/13 0600  HGB 12.8  HCT 37.8  PLT 284  CREATININE 1.03    Estimated Creatinine Clearance: 77.4 ml/min (by C-G formula based on Cr of 1.03).   Medical History: Past Medical History  Diagnosis Date  . Hypertension   . GERD (gastroesophageal reflux disease)   . Lactose intolerance   . MVP (mitral valve prolapse)   . Anxiety   . Pyelonephritis   . Anemia     as a child    Assessment: 58yo female c/o left CP radiating to arm associated w/ nausea and diaphoresis, initial troponin negative, to begin heparin.  Goal of Therapy:  Heparin level 0.3-0.7 units/ml Monitor platelets by anticoagulation protocol: Yes   Plan:  Will give heparin 4000 units IV bolus x1 followed by gtt at 1250 units/hr and monitor heparin levels and CBC.  Wynona Neat, PharmD, BCPS  10/17/2013,7:20 AM

## 2013-10-17 NOTE — ED Notes (Addendum)
Pt states that she has a hx of HTN and takes several medications, states that she has taken everything as prescribed. States she had a cardiac cath 3-4 years ago and it was clean.

## 2013-10-17 NOTE — H&P (Signed)
[facility] History and Physical  [name] Pink NWG:[id number] DOB: Jun 09, 1955 DOA: 10/17/2013  Referring physician:   PCP: Henrine Screws, MD   Chief Complaint: Chest pain HPI:  58 year old female with history of hypertension, has esophageal reflux disease, previous study catheterization 4 years ago which was negative, presented to the ED with left-sided chest pain and left arm pain. Pain development the patient was working on a 6N.She brushed the pain away and kept working, but around 3 am she started having left sided pressure like chest pain with diaphoresis. Chest pain radiating to the back. Pain is not worse with inspiration. Pt has no hx of DVT, PE, and no risk factors for the same, however, she does indicate having mild calf pain for the past few days.  During my evaluation, pt only had arm pain. Nitro given, and pain resolved.  D-dimer was negative, troponin negative, EKG within normal limits, cardiology notified, patient n.p.o.     Review of Systems: negative for the following  Constitutional: Denies fever, chills, diaphoresis, appetite change and fatigue.  HEENT: Denies photophobia, eye pain, redness, hearing loss, ear pain, congestion, sore throat, rhinorrhea, sneezing, mouth sores, trouble swallowing, neck pain, neck stiffness and tinnitus.  Respiratory: Denies SOB, DOE, cough, chest tightness, and wheezing.  Cardiovascular: Positive for chest pain. , palpitations and leg swelling.  Gastrointestinal: Denies nausea, vomiting, abdominal pain, diarrhea, constipation, blood in stool and abdominal distention.  Genitourinary: Denies dysuria, urgency, frequency, hematuria, flank pain and difficulty urinating.  Musculoskeletal: Denies myalgias, back pain, joint swelling, arthralgias and gait problem.  Skin: Denies pallor, rash and wound.  Neurological: Denies dizziness, seizures, syncope, weakness, light-headedness, numbness and headaches.  Hematological: Denies  adenopathy. Easy bruising, personal or family bleeding history  Psychiatric/Behavioral: Denies suicidal ideation, mood changes, confusion, nervousness, sleep disturbance and agitation       Past Medical History  Diagnosis Date  . Hypertension   . GERD (gastroesophageal reflux disease)   . Lactose intolerance   . MVP (mitral valve prolapse)   . Anxiety   . Pyelonephritis   . Anemia     as a child     Past Surgical History  Procedure Laterality Date  . Appendectomy    . Cholecystectomy    . Dilation and curettage of uterus    . Ankle fracture  10/09    bilateral-right was surgically repaired  . Ankle surgery  10/10    screws removed  . Ankle surgery  2/12    removal of all hardware right ankle  . Arm surgery      plate in left arm  . Breast lumpectomy      right breast      Social History:  reports that she has never smoked. She has never used smokeless tobacco. She reports that she does not drink alcohol or use illicit drugs.    Allergies  Allergen Reactions  . Crab [Shellfish Allergy] Anaphylaxis  . Ivp Dye [Iodinated Diagnostic Agents] Anaphylaxis  . Nizatidine Anaphylaxis  . Dilaudid [Hydromorphone Hcl] Nausea And Vomiting    Pt can tolerate morphine  . Hydrocodone-Acetaminophen Nausea And Vomiting  . Oxycodone-Acetaminophen Nausea And Vomiting    Family History  Problem Relation Age of Onset  . Diabetes Maternal Grandmother   . Ovarian cancer Paternal Grandmother   . Prostate cancer Paternal Grandfather   . Ovarian cancer Paternal Aunt   . Breast cancer Maternal Aunt     post menopause  . Breast cancer Other     3  1st cousins, mastectomy  . Hypertension Mother   . Heart disease Mother   . Heart disease Father   . Ulcers Brother   . Goiter Mother   . Other Brother     polio  . Pyelonephritis Daughter   . Breast cancer Sister      Prior to Admission medications   Medication Sig Start Date End Date Taking? Authorizing Provider  amLODipine  (NORVASC) 5 MG tablet Take 5 mg by mouth daily.   Yes Historical Provider, MD  Calcium-Magnesium-Vitamin D (CALCIUM 1200+D3) 600-40-500 MG-MG-UNIT TB24 Take 3 tablets by mouth daily.   Yes Historical Provider, MD  cloNIDine (CATAPRES) 0.1 MG tablet Take 0.1 mg by mouth 2 (two) times daily.    Yes Historical Provider, MD  esomeprazole (NEXIUM) 40 MG capsule Take 40 mg by mouth daily at 12 noon.   Yes Historical Provider, MD  fexofenadine (ALLEGRA) 180 MG tablet Take 180 mg by mouth daily.   Yes Historical Provider, MD  ibuprofen (ADVIL,MOTRIN) 800 MG tablet Take 800 mg by mouth every 8 (eight) hours as needed for moderate pain.   Yes Historical Provider, MD  losartan (COZAAR) 100 MG tablet Take 100 mg by mouth daily.   Yes Historical Provider, MD  metoprolol succinate (TOPROL-XL) 50 MG 24 hr tablet Take 25 mg by mouth daily. Take with or immediately following a meal.   Yes Historical Provider, MD     Physical Exam: Filed Vitals:   10/17/13 0600 10/17/13 0615 10/17/13 0715 10/17/13 0914  BP: 143/111 174/111 139/80 155/74  Pulse: 55 63 63 57  Temp:   98 F (36.7 C) 98.4 F (36.9 C)  TempSrc:   Oral Oral  Resp: 17 20 14 16   Height:      Weight:      SpO2: 97% 99% 96% 97%     Constitutional: Vital signs reviewed. Patient is a well-developed and well-nourished in no acute distress and cooperative with exam. Alert and oriented x3.  Head: Normocephalic and atraumatic  Ear: TM normal bilaterally  Mouth: no erythema or exudates, MMM  Eyes: PERRL, EOMI, conjunctivae normal, No scleral icterus.  Neck: Supple, Trachea midline normal ROM, No JVD, mass, thyromegaly, or carotid bruit present.  Cardiovascular: RRR, S1 normal, S2 normal, no MRG, pulses symmetric and intact bilaterally  Pulmonary/Chest: CTAB, no wheezes, rales, or rhonchi  Abdominal: Soft. Non-tender, non-distended, bowel sounds are normal, no masses, organomegaly, or guarding present.  GU: no CVA tenderness Musculoskeletal: No  joint deformities, erythema, or stiffness, ROM full and no nontender Ext: no edema and no cyanosis, pulses palpable bilaterally (DP and PT)  Hematology: no cervical, inginal, or axillary adenopathy.  Neurological: A&O x3, Strenght is normal and symmetric bilaterally, cranial nerve II-XII are grossly intact, no focal motor deficit, sensory intact to light touch bilaterally.  Skin: Warm, dry and intact. No rash, cyanosis, or clubbing.  Psychiatric: Normal mood and affect. speech and behavior is normal. Judgment and thought content normal. Cognition and memory are normal.       Labs on Admission:    Basic Metabolic Panel:  Recent Labs Lab 10/17/13 0600 10/17/13 0828  NA 142  --   K 4.2  --   CL 104  --   CO2 24  --   GLUCOSE 92  --   BUN 16  --   CREATININE 1.03  --   CALCIUM 9.4  --   MG  --  2.1   Liver Function Tests:  Recent Labs Lab 10/17/13  0828  AST 22  ALT 31  ALKPHOS 122*  BILITOT <0.2*  PROT 7.7  ALBUMIN 3.8   No results found for this basename: LIPASE, AMYLASE,  in the last 168 hours No results found for this basename: AMMONIA,  in the last 168 hours CBC:  Recent Labs Lab 10/17/13 0600  WBC 11.3*  HGB 12.8  HCT 37.8  MCV 83.6  PLT 284   Cardiac Enzymes:  Recent Labs Lab 10/17/13 0828  TROPONINI <0.30    BNP (last 3 results)  Recent Labs  10/17/13 0828  PROBNP 17.1      CBG: No results found for this basename: GLUCAP,  in the last 168 hours  Radiological Exams on Admission: Dg Chest 2 View  10/17/2013   CLINICAL DATA:  Chest pain.  Initial encounter.  EXAM: CHEST  2 VIEW  COMPARISON:  02/09/2013  FINDINGS: Normal heart size and mediastinal contours. No acute infiltrate or edema. No effusion or pneumothorax. No osseous findings to explain chest pain. Cholecystectomy.  IMPRESSION: No active cardiopulmonary disease.   Electronically Signed   By: Jorje Guild M.D.   On: 10/17/2013 06:50    EKG: Independently reviewed. *Date/Time:  Tuesday October 17 2013 05:46:24 EDT  Ventricular Rate: 62  PR Interval: 162  QRS Duration: 80  QT Interval: 440  QTC Calculation: 446  R Axis: 57  Text Interpretation: Normal sinus rhythm Normal ECG Unchanged EKG    Assessment/Plan Active Problems:   Ischemic chest pain   Chest pain   Chest pain, patient does have significant risk factors Being admitted for unstable angina We'll admit the patient to telemetry Currently on aspirin, heparin drip, beta blocker Cardiology notified at [phone], patient n.p.o. for possible stress test Check hemoglobin A1c, lipid panel, 2-D echo   Hypertension Continue on Norvasc, metoprolol  Gastroesophageal reflux Continue PPI   Code Status:   full Family Communication: bedside Disposition Plan: admit   Time spent: 70 mins   [facility] Pager [phone]  If 7PM-7AM, please contact night-coverage [email or web address] Password [email or web address] 10/17/2013, 10:38 AM

## 2013-10-17 NOTE — Consult Note (Signed)
CARDIOLOGY CONSULT NOTE   Patient ID: [name] MRN: [id number] DOB/AGE: 07-13-1955 58 y.o.  Admit date: 10/17/2013  Primary Physician   Henrine Screws, MD Primary Cardiologist  New (seen by [facility] in past) Reason for Consultation chest pain  HPI: [name] is a 58 y.o. female with a history of GERD, HTN, obesity, mild HLD and no past cardiac history who presented to [facility] ED today with chest pain.   She was seen in 2009 for chest pain and underwent LHC by Dr. Marlou Porch with no angiographically significant coronary artery disease, normal EF 65% with no RWMA. Also, secondary to her hypertension, abdominal aortogram was performed and showed no evidence of renal artery stenosis. Aggressive medical management, especially with hypertension was recommended at that time. She has stated that her hypertension has been under good control recently.  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 Around 3 am she started noticing chest pain as well that she described as a pressure. It was constant and 8/10. She was given a NTG which relieved the chest pain but not the left arm pain. The left arm pain is constant and worse with palpation. No hx of exercise or trauma. Her CP was associated with diaphoresis but no SOB. The pressure was worse with exertion and better with rest. This pain was similar to the chest pain that brought her in in 2012 but it was not associated with L arm pain at that time.  She does not smoke or drink. She exercises 3 times a week by walking and does notice some SOB that she attributed to being overweight. Strong family hx of CAD in her sister (MI @ 63yo) and mother (first event in 5s, died of MI in late 76s).  Currently, she is comfortable in bed. She worked the night shift and she is eager to go home if possible.   Past Medical History  Diagnosis Date  . Hypertension   . GERD (gastroesophageal reflux disease)    . Lactose intolerance   . MVP (mitral valve prolapse)   . Anxiety   . Pyelonephritis   . Anemia     as a child     Past Surgical History  Procedure Laterality Date  . Appendectomy    . Cholecystectomy    . Dilation and curettage of uterus    . Ankle fracture  10/09    bilateral-right was surgically repaired  . Ankle surgery  10/10    screws removed  . Ankle surgery  2/12    removal of all hardware right ankle  . Arm surgery      plate in left arm  . Breast lumpectomy      right breast    Allergies  Allergen Reactions  . Crab [Shellfish Allergy] Anaphylaxis  . Ivp Dye [Iodinated Diagnostic Agents] Anaphylaxis  . Nizatidine Anaphylaxis  . Dilaudid [Hydromorphone Hcl] Nausea And Vomiting    Pt can tolerate morphine  . Hydrocodone-Acetaminophen Nausea And Vomiting  . Oxycodone-Acetaminophen Nausea And Vomiting    I have reviewed the patient's current medications . amLODipine  5 mg Oral Daily  . [START ON 10/18/2013] aspirin  324 mg Oral Daily  . metoprolol succinate  25 mg Oral Daily  . esomeprazole  40 mg Oral Daily  . nitroGLYCERIN  0.5 inch Topical 4 times per day  . sodium chloride  3 mL Intravenous Q12H   . heparin 1,250 Units/hr (10/17/13 0924)  levalbuterol, morphine injection, nitroGLYCERIN, ondansetron (ZOFRAN) IV, ondansetron  Prior to Admission medications   Medication Sig Start Date End Date Taking? Authorizing Provider  amLODipine (NORVASC) 5 MG tablet Take 5 mg by mouth daily.   Yes Historical Provider, MD  Calcium-Magnesium-Vitamin D (CALCIUM 1200+D3) 600-40-500 MG-MG-UNIT TB24 Take 3 tablets by mouth daily.   Yes Historical Provider, MD  cloNIDine (CATAPRES) 0.1 MG tablet Take 0.1 mg by mouth 2 (two) times daily.    Yes Historical Provider, MD  esomeprazole (NEXIUM) 40 MG capsule Take 40 mg by mouth daily at 12 noon.   Yes Historical Provider, MD  fexofenadine (ALLEGRA) 180 MG tablet Take 180 mg by mouth daily.   Yes Historical Provider, MD   ibuprofen (ADVIL,MOTRIN) 800 MG tablet Take 800 mg by mouth every 8 (eight) hours as needed for moderate pain.   Yes Historical Provider, MD  losartan (COZAAR) 100 MG tablet Take 100 mg by mouth daily.   Yes Historical Provider, MD  metoprolol succinate (TOPROL-XL) 50 MG 24 hr tablet Take 25 mg by mouth daily. Take with or immediately following a meal.   Yes Historical Provider, MD     History   Social History  . Marital Status: Married    Spouse Name: N/A    Number of Children: 2  . Years of Education: N/A   Occupational History  .  Person   Social History Main Topics  . Smoking status: Never Smoker   . Smokeless tobacco: Never Used  . Alcohol Use: No  . Drug Use: No  . Sexual Activity: Yes    Partners: Male   Other Topics Concern  . Not on file   Social History Narrative  . No narrative on file    No family status information on file.   Family History  Problem Relation Age of Onset  . Diabetes Maternal Grandmother   . Ovarian cancer Paternal Grandmother   . Prostate cancer Paternal Grandfather   . Ovarian cancer Paternal Aunt   . Breast cancer Maternal Aunt     post menopause  . Breast cancer Other     3 1st cousins, mastectomy  . Hypertension Mother   . Heart disease Mother   . Heart disease Father   . Ulcers Brother   . Goiter Mother   . Other Brother     polio  . Pyelonephritis Daughter   . Breast cancer Sister      ROS:  Full 14 point review of systems complete and found to be negative unless listed above.  Physical Exam: Blood pressure 155/74, pulse 57, temperature 98.4 F (36.9 C), temperature source Oral, resp. rate 16, height 5' 8.5" (1.74 m), weight 228 lb (103.42 kg), last menstrual period 07/05/2009, SpO2 97.00%.  General: Well developed, well nourished, female in no acute distress. obese Head: Eyes PERRLA, No xanthomas.   Normocephalic and atraumatic, oropharynx without edema or exudate.   Lungs: CTAB Heart: HRRR S1 S2, no rub/gallop,  Heart irregular rate and rhythm with S1, S2  murmur. pulses are 2+ extrem.   Neck: No carotid bruits. No lymphadenopathy. No JVD. Abdomen: Bowel sounds present, abdomen soft and non-tender without masses or hernias noted. Msk:  No spine or cva tenderness. No weakness, no joint deformities or effusions. Extremities: No clubbing or cyanosis. Trace edema.  Neuro: Alert and oriented X 3. No focal deficits noted. Psych:  Good affect, responds appropriately Skin: No rashes or lesions noted.  Labs:   Lab Results  Component Value  Date   WBC 11.3* 10/17/2013   HGB 12.8 10/17/2013   HCT 37.8 10/17/2013   MCV 83.6 10/17/2013   PLT 284 10/17/2013   No results found for this basename: INR,  in the last 72 hours  Recent Labs Lab 10/17/13 0600 10/17/13 0828  NA 142  --   K 4.2  --   CL 104  --   CO2 24  --   BUN 16  --   CREATININE 1.03  --   CALCIUM 9.4  --   PROT  --  7.7  BILITOT  --  <0.2*  ALKPHOS  --  122*  ALT  --  31  AST  --  22  GLUCOSE 92  --   ALBUMIN  --  3.8   Magnesium  Date Value Ref Range Status  10/17/2013 2.1  1.5 - 2.5 mg/dL Final    Recent Labs  10/17/13 0828  TROPONINI <0.30    Recent Labs  10/17/13 0604  TROPIPOC 0.00   Pro B Natriuretic peptide (BNP)  Date/Time Value Ref Range Status  10/17/2013  8:28 AM 17.1  0 - 125 pg/mL Final    Lab Results  Component Value Date   DDIMER 0.43 10/17/2013    TSH  Date/Time Value Ref Range Status  06/02/2013 10:37 AM 1.048  0.350 - 4.500 uIU/mL Final   Vitamin B-12  Date/Time Value Ref Range Status  06/02/2013 10:37 AM 414  211 - 911 pg/mL Final      CARDIAC CATHETERIZATION  PRIMARY CARE PHYSICIAN: Dwaine Deter, M.D.  INDICATIONS: A 58 year old female with a strong early family history of  coronary artery disease with hypertension and symptoms concerning for  stable exertional angina. Her mother had her first myocardial  infarction in her 62s.   FINDINGS:  1. Left main artery - No  disease. Short.  2. Left anterior descending artery - Two diagonal branches, first of  which has a very proximal takeoff. There was no significant  disease within these vessels. The LAD wraps around the apex and  continues in the intraventricular groove to the mid to basal  portion of the inferior wall. Large vessel.  3. Left circumflex artery - Dominant vessel (actually codominant with  the LAD). There were 3 obtuse marginal branches. No significant  disease.  4. Right coronary artery - Small nondominant vessel.  5. Left ventriculogram - Normal ejection fraction estimated at 65%  with no regional wall motion abnormalities. No significant MR or  mitral valve prolapse observed.  6. Hemodynamics - Left ventricle systolic pressure 628 with a left  ventricular end-diastolic pressure of 8 mmHg. Aortic systolic  pressure 315 with a diastolic of 90 and a mean of 110 mmHg. There  was no significant gradient between the left ventricle and aortic  root.  7. Abdominal aortogram - Bilateral renal arteries appear widely patent  with no significant disease. There was no observable calcification  or plaquing in the abdominal aorta.  IMPRESSION:  1. No angiographically significant coronary artery disease.  2. Normal left ventricular ejection fraction estimated at 65% with no  regional wall motion abnormalities. No observable mitral  regurgitation or mitral valve prolapse.  3. Normal-appearing renal arteries with no significant plaquing.  Abdominal aorta also with no significant plaquing or calcification.  RECOMMENDATIONS: We will continue aggressive medical management,  especially with hypertension. May need to add a titration of  medications. Would pursue a noncardiac chest pain workup. Last LDL was  130, HDL 40.  Echo: 10/17/13 to Normal ejection fraction, no wall motion abnormalities  ECG:  NSR. HR 62. No ST segment changes  Radiology:  Dg Chest 2 View  10/17/2013   CLINICAL DATA:  Chest  pain.  Initial encounter.  EXAM: CHEST  2 VIEW  COMPARISON:  02/09/2013  FINDINGS: Normal heart size and mediastinal contours. No acute infiltrate or edema. No effusion or pneumothorax. No osseous findings to explain chest pain. Cholecystectomy.  IMPRESSION: No active cardiopulmonary disease.   Electronically Signed   By: Jorje Guild M.D.   On: 10/17/2013 06:50    ASSESSMENT AND PLAN:    Active Problems:   Ischemic chest pain   Chest pain  [name] is a 58 y.o. female with a history of GERD, HTN, obesity, mild HLD and no past cardiac history who presented to [facility] ED today with chest pain.   Chest pain felt worrisome for [place] -- Troponin neg x1. ECG with no acute ST or TW changes.  -- D-dimer negative.  -- Continue on aspirin, heparin drip, beta blocker  -- HgA1c, lipid panel, 2-D echo pending.   -- Will wait for one more troponin to return and if negative she can be discharged tonight and has an outpatient stress test at 7:15 am at the [address] location  Hypertension -- slightly elevated upon arrival to ED at 181/101 -- Difficult to control HTN on 4 different meds. Renal arteries normal by cath in 2009. -- Continue on Norvasc 5mg , Toprol XL 25 mg qd, clonidine 0.1mg  BID. Losartan 100mg  currently being held. Okay to resume losartan as an outpatient. Will hold Toprol on the morning of test.  Gastroesophageal reflux  -- Continue PPI    Signed: Crista Luria 10/17/2013 12:31 PM  Pager [phone]  Personally seen and examined. Agree with above. Prior cath in 2009 looked good. Strong family history.  Troponin thus far normal. EKG unremarkable. I discussed her case with her at length and it is reasonable for her to be discharged if current troponin is normal. Troponin for 2pm has just been drawn. If negative, ok for DC home.  We have set up outpatient NUC stress on for tomorrow 7:15am.  Hold metoprolol in am.  OK to take losartan.   She states that her  hypertension has been well controlled recently. I wonder for elevation was secondary to anxiety. Nonetheless, could consider change from metoprolol over to carvedilol in future if necessary. Could also consider low-dose isosorbide.  Candee Furbish, MD

## 2013-10-17 NOTE — ED Notes (Signed)
Pt. reports left chest pain radiating to left arm this morning with nausea and diaphoresis , pain worse when standing and arm movement.

## 2013-10-17 NOTE — ED Provider Notes (Signed)
CSN: [id number]     Arrival date & time 10/17/13  0541 History   First MD Initiated Contact with Patient 10/17/13 0600     Chief Complaint  Patient presents with  . Chest Pain     (Consider location/radiation/quality/duration/timing/severity/associated sxs/prior Treatment) HPI Comments: PT comes in with cc of chest pain. Pt has hx of HTN and her mother passed away from a heart attack at age 57. Pt reports that she has been having left sided chestp ain intermittently for the past few days. Y'day however, she started having L arm pain, dull, around 9 or 10 pm. She brushed the pain away and kept working, but around 3 am she started having left sided pressure like chest pain with diaphoresis. Chest pain radiating to the back. Pain is not worse with inspiration. Pt has no hx of DVT, PE, and no risk factors for the same, however, she does indicate having mild calf pain for the past few days. During my evaluation, pt only had arm pain. Nitro given, and pain resolved.  Patient is a 58 y.o. female presenting with chest pain. The history is provided by the patient.  Chest Pain Associated symptoms: no abdominal pain, no headache, no nausea, no shortness of breath and not vomiting     Past Medical History  Diagnosis Date  . Hypertension   . GERD (gastroesophageal reflux disease)   . Lactose intolerance   . MVP (mitral valve prolapse)   . Anxiety   . Pyelonephritis   . Anemia     as a child   Past Surgical History  Procedure Laterality Date  . Appendectomy    . Cholecystectomy    . Dilation and curettage of uterus    . Ankle fracture  10/09    bilateral-right was surgically repaired  . Ankle surgery  10/10    screws removed  . Ankle surgery  2/12    removal of all hardware right ankle  . Arm surgery      plate in left arm  . Breast lumpectomy      right breast   Family History  Problem Relation Age of Onset  . Diabetes Maternal Grandmother   . Ovarian cancer Paternal Grandmother    . Prostate cancer Paternal Grandfather   . Ovarian cancer Paternal Aunt   . Breast cancer Maternal Aunt     post menopause  . Breast cancer Other     3 1st cousins, mastectomy  . Hypertension Mother   . Heart disease Mother   . Heart disease Father   . Ulcers Brother   . Goiter Mother   . Other Brother     polio  . Pyelonephritis Daughter   . Breast cancer Sister    History  Substance Use Topics  . Smoking status: Never Smoker   . Smokeless tobacco: Never Used  . Alcohol Use: No   OB History   Grav Para Term Preterm Abortions TAB SAB Ect Mult Living   4 2        2      Obstetric Comments   1 stepchild     Review of Systems  Constitutional: Negative for activity change.  Respiratory: Negative for shortness of breath.   Cardiovascular: Positive for chest pain.  Gastrointestinal: Negative for nausea, vomiting and abdominal pain.  Genitourinary: Negative for dysuria.  Musculoskeletal: Positive for myalgias. Negative for neck pain.  Neurological: Negative for headaches.      Allergies  Crab; Ivp dye; Nizatidine; Dilaudid; Hydrocodone-acetaminophen; and  Oxycodone-acetaminophen  Home Medications   Prior to Admission medications   Medication Sig Start Date End Date Taking? Authorizing Provider  amLODipine (NORVASC) 5 MG tablet Take 5 mg by mouth daily.   Yes Historical Provider, MD  Calcium-Magnesium-Vitamin D (CALCIUM 1200+D3) 600-40-500 MG-MG-UNIT TB24 Take 3 tablets by mouth daily.   Yes Historical Provider, MD  cloNIDine (CATAPRES) 0.1 MG tablet Take 0.1 mg by mouth 2 (two) times daily.    Yes Historical Provider, MD  esomeprazole (NEXIUM) 40 MG capsule Take 40 mg by mouth daily at 12 noon.   Yes Historical Provider, MD  fexofenadine (ALLEGRA) 180 MG tablet Take 180 mg by mouth daily.   Yes Historical Provider, MD  ibuprofen (ADVIL,MOTRIN) 800 MG tablet Take 800 mg by mouth every 8 (eight) hours as needed for moderate pain.   Yes Historical Provider, MD  losartan  (COZAAR) 100 MG tablet Take 100 mg by mouth daily.   Yes Historical Provider, MD  metoprolol succinate (TOPROL-XL) 50 MG 24 hr tablet Take 25 mg by mouth daily. Take with or immediately following a meal.   Yes Historical Provider, MD   BP 174/111  Pulse 63  Temp(Src) 98.1 F (36.7 C) (Oral)  Resp 20  Ht 5' 8.5" (1.74 m)  Wt 228 lb (103.42 kg)  BMI 34.16 kg/m2  SpO2 99%  LMP 07/05/2009 Physical Exam  Nursing note and vitals reviewed. Constitutional: She is oriented to person, place, and time. She appears well-developed and well-nourished.  HENT:  Head: Normocephalic and atraumatic.  Eyes: EOM are normal. Pupils are equal, round, and reactive to light.  Neck: Neck supple.  Cardiovascular: Normal rate, regular rhythm, normal heart sounds and intact distal pulses.   No murmur heard. Pulmonary/Chest: Effort normal. No respiratory distress.  Abdominal: Soft. She exhibits no distension. There is no tenderness. There is no rebound and no guarding.  Neurological: She is alert and oriented to person, place, and time.  Skin: Skin is warm and dry.    ED Course  Procedures (including critical care time) Labs Review Labs Reviewed  CBC - Abnormal; Notable for the following:    WBC 11.3 (*)    All other components within normal limits  BASIC METABOLIC PANEL - Abnormal; Notable for the following:    GFR calc non Af Amer 60 (*)    GFR calc Af Amer 69 (*)    All other components within normal limits  D-DIMER, QUANTITATIVE  HEPARIN LEVEL (UNFRACTIONATED)  Randolm Idol, ED    Imaging Review Dg Chest 2 View  10/17/2013   CLINICAL DATA:  Chest pain.  Initial encounter.  EXAM: CHEST  2 VIEW  COMPARISON:  02/09/2013  FINDINGS: Normal heart size and mediastinal contours. No acute infiltrate or edema. No effusion or pneumothorax. No osseous findings to explain chest pain. Cholecystectomy.  IMPRESSION: No active cardiopulmonary disease.   Electronically Signed   By: Jorje Guild M.D.   On:  10/17/2013 06:50     EKG Interpretation   Date/Time:  Tuesday October 17 2013 05:46:24 EDT Ventricular Rate:  62 PR Interval:  162 QRS Duration: 80 QT Interval:  440 QTC Calculation: 446 R Axis:   57 Text Interpretation:  Normal sinus rhythm Normal ECG Unchanged EKG  Confirmed by Kathrynn Humble, MD, Thelma Comp [phone]) on 10/17/2013 6:00:40 AM       CRITICAL CARE Performed by: Varney Biles   Total critical care time: 45 minutes  Critical care time was exclusive of separately billable procedures and treating other patients.  Critical care was necessary to treat or prevent imminent or life-threatening deterioration.  Critical care was time spent personally by me on the following activities: development of treatment plan with patient and/or surrogate as well as nursing, discussions with consultants, evaluation of patient's response to treatment, examination of patient, obtaining history from patient or surrogate, ordering and performing treatments and interventions, ordering and review of laboratory studies, ordering and review of radiographic studies, pulse oximetry and re-evaluation of patient's condition.  MDM   Final diagnoses:  Ischemic chest pain  Swelling of lower extremity   Pt comes in with cc of chest pain, L arm pain. Appears to be ischemic type chest pain. Mother passed away at age 53 with MI. EKG is reassuring, and troponin is neg. Pt given nitro, and her arm pain resolved. Chest pain free now. Will treat as ischemic pain however, as she has been having off and on chest pain leading upto today, that she was ignoring.  Dimer ordered for the calf pain. No signs of DVT. No concerns for PE.      Varney Biles, MD 10/17/13 [phone]

## 2013-10-17 NOTE — Progress Notes (Signed)
ANTICOAGULATION CONSULT NOTE - Follow Up Consult  Pharmacy Consult for heparin Indication: atrial fibrillation  Allergies  Allergen Reactions  . Crab [Shellfish Allergy] Anaphylaxis  . Ivp Dye [Iodinated Diagnostic Agents] Anaphylaxis  . Nizatidine Anaphylaxis  . Dilaudid [Hydromorphone Hcl] Nausea And Vomiting    Pt can tolerate morphine  . Hydrocodone-Acetaminophen Nausea And Vomiting  . Oxycodone-Acetaminophen Nausea And Vomiting    Patient Measurements: Height: 5' 8.5" (174 cm) Weight: 228 lb (103.42 kg) IBW/kg (Calculated) : 65.05 Heparin Dosing Weight: 90kg  Vital Signs: Temp: 98.4 F (36.9 C) (10/13 0914) Temp Source: Oral (10/13 0914) BP: 155/74 mmHg (10/13 0914) Pulse Rate: 57 (10/13 0914)  Labs:  Recent Labs  10/17/13 0600 10/17/13 0828 10/17/13 1340  HGB 12.8  --   --   HCT 37.8  --   --   PLT 284  --   --   HEPARINUNFRC  --   --  0.53  CREATININE 1.03  --   --   TROPONINI  --  <0.30 <0.30    Estimated Creatinine Clearance: 77.4 ml/min (by C-G formula based on Cr of 1.03).   Medical History: Past Medical History  Diagnosis Date  . Hypertension   . GERD (gastroesophageal reflux disease)   . Lactose intolerance   . MVP (mitral valve prolapse)   . Anxiety   . Pyelonephritis   . Anemia     as a child    Assessment: 58yo female c/o left CP radiating to arm associated w/ nausea and diaphoresis, started on heparin. Troponin x 2 neg. Initial HL was therapeutic. H/H and plt wnl.    Goal of Therapy:  Heparin level 0.3-0.7 units/ml Monitor platelets by anticoagulation protocol: Yes   Plan:  -Continue heparin infusion at 1250 units/hr -F/u 6 hour HL if discharge plans change  -Monitor daily HL, CBC, and s/s of bleeding   Albertina Parr, PharmD.  Clinical Pharmacist Pager [phone]

## 2013-10-18 ENCOUNTER — Ambulatory Visit (HOSPITAL_COMMUNITY): Payer: 59 | Attending: Cardiovascular Disease | Admitting: Radiology

## 2013-10-18 VITALS — BP 121/88 | HR 50 | Ht 69.0 in | Wt 222.0 lb

## 2013-10-18 DIAGNOSIS — R079 Chest pain, unspecified: Secondary | ICD-10-CM | POA: Insufficient documentation

## 2013-10-18 DIAGNOSIS — R0609 Other forms of dyspnea: Secondary | ICD-10-CM | POA: Insufficient documentation

## 2013-10-18 DIAGNOSIS — I10 Essential (primary) hypertension: Secondary | ICD-10-CM | POA: Insufficient documentation

## 2013-10-18 DIAGNOSIS — R61 Generalized hyperhidrosis: Secondary | ICD-10-CM | POA: Insufficient documentation

## 2013-10-18 MED ORDER — TECHNETIUM TC 99M SESTAMIBI GENERIC - CARDIOLITE
11.0000 | Freq: Once | INTRAVENOUS | Status: AC | PRN
  Administered 2013-10-18: 11 via INTRAVENOUS

## 2013-10-18 MED ORDER — TECHNETIUM TC 99M SESTAMIBI GENERIC - CARDIOLITE
33.0000 | Freq: Once | INTRAVENOUS | Status: AC | PRN
  Administered 2013-10-18: 33 via INTRAVENOUS

## 2013-10-18 NOTE — Progress Notes (Signed)
[place] [address] [place], [place] [address] [phone]    Cardiology Nuclear Med Study  [name] is a 58 y.o. female     MRN : [id number]     DOB: 05/30/1955  Procedure Date: 10/18/2013  Nuclear Med Background Indication for Stress Test:  Evaluation for Ischemia and [facility]  10/15 CP History:  No known CAD, LHC 2009 (normal) EF 65%, MVP Cardiac Risk Factors: Hypertension  Symptoms:  Chest Pain, Diaphoresis and DOE   Nuclear Pre-Procedure Caffeine/Decaff Intake:  None NPO After: 8:00pm   Lungs:  clear O2 Sat: 94% on room air. IV 0.9% NS with Angio Cath:  22g  IV Site: R Antecubital  IV Started by:  Crissie Figures, RN  Chest Size (in):  42 Cup Size: C  Height: 5\' 9"  (1.753 m)  Weight:  222 lb (100.699 kg)  BMI:  Body mass index is 32.77 kg/(m^2). Tech Comments:  Metoprolol held x 24 hrs    Nuclear Med Study 1 or 2 day study: 1 day  Stress Test Type:  Stress  Reading MD: N/A  Order Authorizing Provider:  Candee Furbish, MD  Resting Radionuclide: Technetium 70m Sestamibi  Resting Radionuclide Dose: 11.0 mCi   Stress Radionuclide:  Technetium 90m Sestamibi  Stress Radionuclide Dose: 33.0 mCi           Stress Protocol Rest HR: 50 Stress HR: 151  Rest BP: 121/88 Stress BP: 165/92  Exercise Time (min): 8:00 METS: 10.1           Dose of Adenosine (mg):  n/a Dose of Lexiscan: n/a mg  Dose of Atropine (mg): n/a Dose of Dobutamine: n/a mcg/kg/min (at max HR)  Stress Test Technologist: Glade Lloyd, BS-ES  Nuclear Technologist:  Margie Ege     Rest Procedure:  Myocardial perfusion imaging was performed at rest 45 minutes following the intravenous administration of Technetium 81m Sestamibi. Rest ECG: sinus bradycardia with no ST changes  Stress Procedure:  The patient exercised on the treadmill utilizing the Bruce Protocol for 8:00 minutes. The patient stopped due to being very SOB and denied any chest pain.   Technetium 11m Sestamibi was injected at peak exercise and myocardial perfusion imaging was performed after a brief delay. Stress ECG: No significant change from baseline ECG  QPS Raw Data Images:  Mild breast attenuation.  Normal left ventricular size. Stress Images:  There is decreased uptake in the basal and mid inferolateral wall. Rest Images:  There is decreased uptake in the basal and mid infeolateral wall. Subtraction (SDS):  No evidence of ischemia. Transient Ischemic Dilatation (Normal <1.22):  0.83 Lung/Heart Ratio (Normal <0.45):  0.32  Quantitative Gated Spect Images QGS EDV:  95 ml QGS ESV:  35 ml  Impression Exercise Capacity:  Good exercise capacity. BP Response:  Normal blood pressure response. Clinical Symptoms:  There is dyspnea. ECG Impression:  No significant ST segment change suggestive of ischemia. Comparison with Prior Nuclear Study: No images to compare  Overall Impression:  Low risk stress nuclear study with a small sized, mild fixed defect in the mid and basal inferolateral wall consistent with breast attenuation artifact.  No evidence of ischemia..  LV Ejection Fraction: 63%.  LV Wall Motion:  NL LV Function; NL Wall Motion  Signed: Fransico Him, MD [facility] 10/18/2013

## 2013-10-20 ENCOUNTER — Telehealth: Payer: Self-pay | Admitting: Cardiology

## 2013-10-20 NOTE — Telephone Encounter (Signed)
Per Angelena Form. PA - left message of normal results of testing. Called pt back and left message that there is documentation of this however if she has further questions or concerns to please call back.

## 2013-10-20 NOTE — Telephone Encounter (Signed)
New message  ° ° °Patient calling for test results.   °

## 2013-10-24 ENCOUNTER — Encounter: Payer: Self-pay | Admitting: Neurology

## 2013-10-24 ENCOUNTER — Ambulatory Visit (INDEPENDENT_AMBULATORY_CARE_PROVIDER_SITE_OTHER): Payer: 59 | Admitting: Neurology

## 2013-10-24 VITALS — BP 128/84 | HR 82 | Resp 16 | Ht 68.0 in | Wt 227.0 lb

## 2013-10-24 DIAGNOSIS — M79602 Pain in left arm: Secondary | ICD-10-CM

## 2013-10-24 DIAGNOSIS — M5010 Cervical disc disorder with radiculopathy, unspecified cervical region: Secondary | ICD-10-CM

## 2013-10-24 MED ORDER — METHYLPREDNISOLONE (PAK) 4 MG PO TABS: ORAL_TABLET | ORAL | Status: DC

## 2013-10-24 NOTE — Patient Instructions (Signed)
1. Schedule EMG/NCV of the left UE 2. Start physical therapy for left arm pain  3. Start Medrol dose pack, take for 8 days as instructed 4. Follow-up in 6 weeks

## 2013-10-24 NOTE — Progress Notes (Signed)
NEUROLOGY FOLLOW UP OFFICE NOTE  [name] [id number]  HISTORY OF PRESENT ILLNESS: I had the pleasure of seeing [name] in follow-up in the neurology clinic on 10/24/2013.  The patient was last seen 4 months ago for episodes of significant drowsiness after eating concentrated sweets. Her MRI brain was normal. Sleep study showed moderate OSA. She is now on CPAP and feels significantly better during the daytime. No further episodes of excessive daytime drowsiness.  She presents today with new symptoms of pain in the left arm and in the lower neck down to above her bra line.  She was at work last 10/16/13 when she started having left arm pain described like a constant toothache that progressively became unbearable. The next day she started having chest pain, BP was noted to be 180/110. She went to the ER, with relief of pain after Nitro. Cardiac workup was normal including a stress test. She denies any further chest pain, but continues to have left upper arm pain.  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 The next day, she had to put an ice pack under her arm. She describes the pain as throbbing, with no associated numbness/tingling. No pain in the hand. She has been taking Ibuprofen 800mg  BID and using Biofreeze.  She has had upper back pain issues since the beginning of the year and had changed her mattress due to this. 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 She denies any bowel/bladder dysfunction, no leg symptoms.   She had a MRI cervical spine last 06/2013 which showed multilevel cervical spondylotic changes most notable C5-6 level. At C3-4 broad -based disc osteophyte complex greater to left. Crowding of the ventral nerve roots greater on the left. Moderate to marked left foraminal narrowing. Minimal to mild right foraminal narrowing. C4-5 multifactorial mild spinal stenosis. Crowding of the ventral nerve roots without  cord compression. Moderate left-sided and mild right-sided foraminal narrowing. C5-6 broad-based disc osteophyte complex. Spinal stenosis with crowding of the ventral nerve roots and very mild cord contact/ deformity. Mild to moderate foraminal narrowing bilaterally. C6-7 broad-based disc osteophyte complex slightly greater to left. Spinal stenosis with crowding of the ventral nerve roots and mild cord contact/ deformity slightly greater on left. Mild to moderate left-sided and mild right-sided foraminal narrowing. C7-T1 minimal right uncinate hypertrophy and foraminal narrowing. Bulge/ spur greatest right paracentral position with slight crowding of the right ventral nerve root.  HPI: This is a [place] 58 yo RH woman with a history of hypertension and migraines, in her usual state of health until March 2015 when she started having significant drowsiness after eating concentrated sweets. She noticed that 30-60 minutes after eating a honeybun or cinnamon melts, she could not keep her head up, "unreasonably sleepy." A month ago she ate an apple crisp from [employer] and kept falling asleep at stoplights. She eventually made it to her house and recalls opening the garage door, then woke up in her car 2 hours later. She was staggering as she got out of the car, and had a hard time getting the car door open. She reports having her glucose levels checked 2 weeks ago pre- and post-ingestion of an apple crisp, she started having similar significant drowsiness, dizziness, however her glucose levels apparently did not change significantly (89 to 93, per patient). She had an EKG at that time, which was reported as normal. Vital signs showed pulse of 53, BP  lying down 166/86, sitting 180/100, standing 170/108. This was the last episode, she has avoided concentrated sweets since then. She reports having dizzy spells daily lasting for an hour, occurring even without ingestion of concentrated sweets. She was in the grocery  store one time and felt dizzy, "like a fog in my head," hot, and feeling as if she would pass out. She denied any palpitations, chest pain, or shortness of breath. At times the dizzy episodes are associated with a slight headache. She has headaches 1-2 times a week, with frontal 5/10 pressure-like pain with mild nausea, photo and phonophobia, no vomiting. Excedrin helps with the headaches. She had some nasal drainage recently and was diagnosed with a sinus infection. She has also been waking up with left-sided neck pain despite trying different pillows.   Diagnostic Data: TSH and B12 normal. I personally reviewed MRI brain which did not show any acute changes. There is scattered patchy white matter changes bilaterally consistent with chronic microvascular disease.  She has had a sleep study which showed moderate obstructive sleep apnea/hypopnea syndrome, AHI 28.6 per hour with supine events. Very loud snoring with oxygen desaturation to a nadir of 88% on room air. She had a successful CPAP titration to 7 CWP, AHI 0 per hour. She wore a small [employer] Eson nasal mask with heated humidifier. Snoring was prevented and mean oxygen saturation held 93.6% on room air with CPAP.   PAST MEDICAL HISTORY: Past Medical History  Diagnosis Date  . Hypertension   . GERD (gastroesophageal reflux disease)   . Lactose intolerance   . MVP (mitral valve prolapse)   . Anxiety   . Pyelonephritis   . Anemia     as a child    MEDICATIONS: Current Outpatient Prescriptions on File Prior to Visit  Medication Sig Dispense Refill  . amLODipine (NORVASC) 5 MG tablet Take 5 mg by mouth daily.      . Calcium-Magnesium-Vitamin D (CALCIUM 1200+D3) 600-40-500 MG-MG-UNIT TB24 Take 3 tablets by mouth daily.      . cloNIDine (CATAPRES) 0.1 MG tablet Take 0.1 mg by mouth 2 (two) times daily.       Marland Kitchen esomeprazole (NEXIUM) 40 MG capsule Take 40 mg by mouth daily at 12 noon.      . fexofenadine (ALLEGRA) 180 MG tablet Take  180 mg by mouth daily.      Marland Kitchen losartan (COZAAR) 100 MG tablet Take 100 mg by mouth daily.      . metoprolol succinate (TOPROL-XL) 50 MG 24 hr tablet Take 25 mg by mouth daily. Take with or immediately following a meal.      . nitroGLYCERIN (NITROSTAT) 0.4 MG SL tablet Place 1 tablet (0.4 mg total) under the tongue every 5 (five) minutes as needed for chest pain.  30 tablet  12   No current facility-administered medications on file prior to visit.    ALLERGIES: Allergies  Allergen Reactions  . Crab [Shellfish Allergy] Anaphylaxis  . Ivp Dye [Iodinated Diagnostic Agents] Anaphylaxis  . Nizatidine Anaphylaxis  . Dilaudid [Hydromorphone Hcl] Nausea And Vomiting    Pt can tolerate morphine  . Hydrocodone-Acetaminophen Nausea And Vomiting  . Oxycodone-Acetaminophen Nausea And Vomiting    FAMILY HISTORY: Family History  Problem Relation Age of Onset  . Diabetes Maternal Grandmother   . Ovarian cancer Paternal Grandmother   . Prostate cancer Paternal Grandfather   . Ovarian cancer Paternal Aunt   . Breast cancer Maternal Aunt     post menopause  .  Breast cancer Other     3 1st cousins, mastectomy  . Hypertension Mother   . Heart disease Mother   . Heart disease Father   . Ulcers Brother   . Goiter Mother   . Other Brother     polio  . Pyelonephritis Daughter   . Breast cancer Sister     SOCIAL HISTORY: History   Social History  . Marital Status: Married    Spouse Name: N/A    Number of Children: 2  . Years of Education: N/A   Occupational History  .  [employer]   Social History Main Topics  . Smoking status: Never Smoker   . Smokeless tobacco: Never Used  . Alcohol Use: No  . Drug Use: No  . Sexual Activity: Yes    Partners: Male   Other Topics Concern  . Not on file   Social History Narrative  . No narrative on file    REVIEW OF SYSTEMS: Constitutional: No fevers, chills, or sweats, no generalized fatigue, change in appetite Eyes: No visual changes,  double vision, eye pain Ear, nose and throat: No hearing loss, ear pain, nasal congestion, sore throat Cardiovascular: No chest pain, palpitations Respiratory:  No shortness of breath at rest or with exertion, wheezes GastrointestinaI: No nausea, vomiting, diarrhea, abdominal pain, fecal incontinence Genitourinary:  No dysuria, urinary retention or frequency Musculoskeletal:  No neck pain,+ back pain Integumentary: No rash, pruritus, skin lesions Neurological: as above Psychiatric: No depression, insomnia, anxiety Endocrine: No palpitations, fatigue, diaphoresis, mood swings, change in appetite, change in weight, increased thirst Hematologic/Lymphatic:  No anemia, purpura, petechiae. Allergic/Immunologic: no itchy/runny eyes, nasal congestion, recent allergic reactions, rashes  PHYSICAL EXAM: Filed Vitals:   10/24/13 1029  BP: 128/84  Pulse: 82  Resp: 16   General: No acute distress Head:  Normocephalic/atraumatic Neck: supple, no paraspinal tenderness, full range of motion Heart:  Regular rate and rhythm Lungs:  Clear to auscultation bilaterally Back: No paraspinal tenderness Skin/Extremities: No rash, no edema Neurological Exam: alert and oriented to person, place, and time. No aphasia or dysarthria. Fund of knowledge is appropriate.  Recent and remote memory are intact.  Attention and concentration are normal.    Able to name objects and repeat phrases. Cranial nerves: Pupils equal, round, reactive to light.  Fundoscopic exam unremarkable, no papilledema. Extraocular movements intact with no nystagmus. Visual fields full. Facial sensation intact. No facial asymmetry. Tongue, uvula, palate midline.  Motor: Bulk and tone normal, muscle strength 5/5 throughout with no pronator drift.  Sensation decreased to pin on the left UE and LE, intact to cold, vibration, joint position sense. Deep tendon reflexes 2+ throughout, toes downgoing, negative Hoffman sign.  Finger to nose testing intact.   Gait narrow-based and steady, able to tandem walk adequately.  Romberg negative.  IMPRESSION: This is a [place] 58 yo RH woman with a history of hypertension and migraines, who initially presented with episodes of excessive drowsiness after ingestion of concentrated sweets. Her sleep study showed moderate obstructive sleep apnea. It is possible the sleep apnea causes some daytime drowsiness that is worsened after the eats concentrated sweets, and she has been feeling better since CPAP initiation. She presents with new symptoms of left arm pain and numbness. Her cervical MRI does show degenerative changes, more on the left side. EMG/NCV of the left UE will be ordered to assess for radiculopathy. She will start a Medrol dose pack and will be referred for physical therapy. She will follow-up in 6  weeks.   Thank you for allowing me to participate in her care.  Please do not hesitate to call for any questions or concerns.  The duration of this appointment visit was 25 minutes of face-to-face time with the patient.  Greater than 50% of this time was spent in counseling, explanation of diagnosis, planning of further management, and coordination of care.   Ellouise Newer, M.D.   CC: Dr. Inda Merlin

## 2013-10-27 ENCOUNTER — Encounter: Payer: Self-pay | Admitting: Neurology

## 2013-10-27 DIAGNOSIS — M79602 Pain in left arm: Secondary | ICD-10-CM | POA: Insufficient documentation

## 2013-10-27 DIAGNOSIS — M5010 Cervical disc disorder with radiculopathy, unspecified cervical region: Secondary | ICD-10-CM | POA: Insufficient documentation

## 2013-10-31 ENCOUNTER — Ambulatory Visit: Payer: 59 | Attending: Neurology | Admitting: Physical Therapy

## 2013-10-31 DIAGNOSIS — M546 Pain in thoracic spine: Secondary | ICD-10-CM | POA: Diagnosis not present

## 2013-10-31 DIAGNOSIS — I10 Essential (primary) hypertension: Secondary | ICD-10-CM | POA: Insufficient documentation

## 2013-10-31 DIAGNOSIS — Z5189 Encounter for other specified aftercare: Secondary | ICD-10-CM | POA: Diagnosis present

## 2013-10-31 DIAGNOSIS — M5010 Cervical disc disorder with radiculopathy, unspecified cervical region: Secondary | ICD-10-CM | POA: Diagnosis not present

## 2013-10-31 DIAGNOSIS — M25512 Pain in left shoulder: Secondary | ICD-10-CM | POA: Diagnosis not present

## 2013-10-31 DIAGNOSIS — R293 Abnormal posture: Secondary | ICD-10-CM | POA: Insufficient documentation

## 2013-11-01 ENCOUNTER — Ambulatory Visit: Payer: 59 | Admitting: Rehabilitation

## 2013-11-01 DIAGNOSIS — Z5189 Encounter for other specified aftercare: Secondary | ICD-10-CM | POA: Diagnosis not present

## 2013-11-03 ENCOUNTER — Telehealth: Payer: Self-pay | Admitting: Pulmonary Disease

## 2013-11-03 DIAGNOSIS — G4733 Obstructive sleep apnea (adult) (pediatric): Secondary | ICD-10-CM

## 2013-11-03 NOTE — Telephone Encounter (Signed)
Order was sent to [facility]  Pt aware  Nothing further needed 

## 2013-11-06 ENCOUNTER — Encounter: Payer: Self-pay | Admitting: Neurology

## 2013-11-14 ENCOUNTER — Ambulatory Visit: Payer: 59 | Attending: Neurology

## 2013-11-14 DIAGNOSIS — M25512 Pain in left shoulder: Secondary | ICD-10-CM | POA: Diagnosis not present

## 2013-11-14 DIAGNOSIS — M5010 Cervical disc disorder with radiculopathy, unspecified cervical region: Secondary | ICD-10-CM | POA: Diagnosis not present

## 2013-11-14 DIAGNOSIS — Z5189 Encounter for other specified aftercare: Secondary | ICD-10-CM | POA: Insufficient documentation

## 2013-11-14 DIAGNOSIS — R293 Abnormal posture: Secondary | ICD-10-CM | POA: Diagnosis not present

## 2013-11-14 DIAGNOSIS — I10 Essential (primary) hypertension: Secondary | ICD-10-CM | POA: Diagnosis not present

## 2013-11-14 DIAGNOSIS — M546 Pain in thoracic spine: Secondary | ICD-10-CM | POA: Insufficient documentation

## 2013-11-14 NOTE — Patient Instructions (Signed)
Continue HEP as instructed.

## 2013-11-14 NOTE — Therapy (Signed)
Physical Therapy Treatment  Patient Details  Name: [name] MRN: [id number] Date of Birth: [birth year]  Encounter Date: 11/14/2013      PT End of Session - 11/14/13 1516    Visit Number 3   Number of Visits 12   Date for PT Re-Evaluation 12/14/13   PT Start Time 2620   PT Stop Time 1500   PT Time Calculation (min) 45 min   Activity Tolerance Patient tolerated treatment well  pain decreased from 5/10 to 4/10 after treatment      Past Medical History  Diagnosis Date  . Hypertension   . GERD (gastroesophageal reflux disease)   . Lactose intolerance   . MVP (mitral valve prolapse)   . Anxiety   . Pyelonephritis   . Anemia     as a child    Past Surgical History  Procedure Laterality Date  . Appendectomy    . Cholecystectomy    . Dilation and curettage of uterus    . Ankle fracture  10/09    bilateral-right was surgically repaired  . Ankle surgery  10/10    screws removed  . Ankle surgery  2/12    removal of all hardware right ankle  . Arm surgery      plate in left arm  . Breast lumpectomy      right breast    LMP 07/05/2009  Visit Diagnosis:  No diagnosis found.      Subjective Assessment - 11/14/13 1426    Symptoms 6/10 pain into L posterior arm and mid- back, "from neck to T2"   Currently in Pain? Yes   Pain Score 6    Pain Location Arm   Pain Orientation Left   Pain Descriptors / Indicators Aching   Pain Radiating Towards L arm and mid back    Aggravating Factors  standing, and bending over, looking down.    Pain Relieving Factors looking up.    Effect of Pain on Daily Activities work duties            [employer] Adult PT Treatment/Exercise - 11/14/13 0001    Exercises   Exercises Neck   Neck Exercises: Seated   Neck Retraction 10 reps;3 secs  supine and seated   Modalities   Modalities Moist Heat   Moist Heat Therapy   Number Minutes Moist Heat 10 Minutes   Moist Heat Location Other (comment)  cervical   Manual Therapy   Manual Therapy Massage;Myofascial release;Manual Traction   Joint Mobilization Radial nerve glides, pt supine, 100 reps   Improved ABD from 60 to 80 degrees before symptoms.    Massage STM to c- spine   Manual Traction to c-spine with pt supine, 2 minsx 3 reps          PT Education - 11/14/13 1509    Education provided Yes   Education Details Continue HEP. Adjust levels of pillows .   Person(s) Educated Patient   Methods Explanation;Demonstration;Tactile cues;Verbal cues   Comprehension Verbalized understanding;Returned demonstration;Verbal cues required          PT Short Term Goals - 11/14/13 1520    PT SHORT TERM GOAL #1   Title all STGs= LTGs   Time 6   Period Weeks   Status New          PT Long Term Goals - 11/14/13 1521    PT LONG TERM GOAL #1   Title Demonstrate and/or verbalize techniques to reduce the risk of re-injury to include info  on: posture, body mechanics, lifting   Time 6   Period Weeks   Status New   PT LONG TERM GOAL #2   Title be indep with advanced HEP cervical stabilization, thoracic flexibility and core   Time 6   Period Weeks   Status New   PT LONG TERM GOAL #3   Title tolerate standing for 30 mins and writing with L UE with pain decreased to 3/10 or less/ minimal numbness using in L UE   Time 6   Period Weeks   Status New   PT LONG TERM GOAL #4   Title tolerate sitting for 30 mins with postural support to prevent numbness in L UE.   Time 6   Period Weeks   Status New   PT LONG TERM GOAL #5   Title report pain decrease to 3/10 or less while walking for exercise   Time 6   Period Weeks   Status New   Additional Long Term Goals   Additional Long Term Goals Yes   PT LONG TERM GOAL #6   Title improve FOTO from 47% to 31%   Time 6   Period Weeks   Status New          Plan - 11/14/13 1517    Clinical Impression Statement Cervical radiculopathy secondary to stenosis and " disc osteophyte complex" per MRI results. Today, pt  presented with upper limb tension test, positive for Radial nerve at 60 degrees initially. Following performing Radial nerve mobs, symptoms were reproduced at 80 degrees, instead of 60 degrees. Pain decreased from 6/10 to 4/10 after treatment.    Pt will benefit from skilled therapeutic intervention in order to improve on the following deficits Pain;Decreased mobility   Rehab Potential Excellent   PT Frequency 2x / week   PT Duration 6 weeks   PT Treatment/Interventions Moist Heat;Manual techniques;Therapeutic exercise   PT Next Visit Plan manual traction, nerve mobs, postural ex        Problem List Patient Active Problem List   Diagnosis Date Noted  . Left arm pain 10/27/2013  . Cervical disc disorder with radiculopathy of cervical region 10/27/2013  . Ischemic chest pain 10/17/2013  . Chest pain 10/17/2013  . Obstructive sleep apnea 06/28/2013  . Dizziness 06/02/2013  . Headache(784.0) 06/02/2013  . Drowsiness 06/02/2013  . Neck pain 06/02/2013  . ABNORMALITY OF GAIT 03/15/2009  . ANKLE PAIN, BILATERAL 03/14/2009  . FOOT PAIN, BILATERAL 03/14/2009                                              Dollene Cleveland 11/14/2013, 3:28 PM

## 2013-11-16 ENCOUNTER — Ambulatory Visit: Payer: 59 | Admitting: Physical Therapy

## 2013-11-16 DIAGNOSIS — M25512 Pain in left shoulder: Secondary | ICD-10-CM

## 2013-11-16 DIAGNOSIS — Z5189 Encounter for other specified aftercare: Secondary | ICD-10-CM | POA: Diagnosis not present

## 2013-11-16 DIAGNOSIS — M546 Pain in thoracic spine: Secondary | ICD-10-CM

## 2013-11-16 DIAGNOSIS — M542 Cervicalgia: Secondary | ICD-10-CM

## 2013-11-16 DIAGNOSIS — R293 Abnormal posture: Secondary | ICD-10-CM

## 2013-11-16 NOTE — Therapy (Signed)
Physical Therapy Treatment  Patient Details  Name: [name] MRN: [id number] Date of Birth: 12/09/1955  Encounter Date: 11/16/2013      PT End of Session - 11/16/13 1001    Visit Number 4   Number of Visits 12   Date for PT Re-Evaluation 12/14/13   PT Start Time 0800   PT Stop Time 0906   PT Time Calculation (min) 66 min   Activity Tolerance Patient tolerated treatment well      Past Medical History  Diagnosis Date  . Hypertension   . GERD (gastroesophageal reflux disease)   . Lactose intolerance   . MVP (mitral valve prolapse)   . Anxiety   . Pyelonephritis   . Anemia     as a child    Past Surgical History  Procedure Laterality Date  . Appendectomy    . Cholecystectomy    . Dilation and curettage of uterus    . Ankle fracture  10/09    bilateral-right was surgically repaired  . Ankle surgery  10/10    screws removed  . Ankle surgery  2/12    removal of all hardware right ankle  . Arm surgery      plate in left arm  . Breast lumpectomy      right breast    LMP 07/05/2009  Visit Diagnosis:  Pain in neck  Pain in thoracic spine  Pain in joint, shoulder region, left  Abnormal posture      Subjective Assessment - 11/16/13 0818    Symptoms 4/10 aching central neck to thoracic.  No left arm pain now.  Last arm pain yesterday when she lets her arm hang while standing at computer.  Sometimes has arm pain only.  Exercises help a lot if she does not do them her pain returns.   Currently in Pain? Yes   Pain Score 4    Pain Location Thoracic   Pain Orientation Left   Pain Descriptors / Indicators Aching   Pain Frequency Intermittent   Aggravating Factors  standing   Pain Relieving Factors exercises   Effect of Pain on Daily Activities work duties   Multiple Pain Sites No            [facility] Adult PT Treatment/Exercise - 11/16/13 0822    Neck Exercises: Standing   Other Standing Exercises neural glide, arm hang, did not tolerate   Neck  Exercises: Supine   Other Supine Exercise neural glide supine unable to lie suping with increasing arm symptoms today even with neck roll   Modalities   Modalities Traction;Moist Heat  10 lbs. 60 seconds pull 15 seconds rest at 5 Lbs. neck   Moist Heat Therapy   Number Minutes Moist Heat 15 Minutes   Manual Therapy   Edema Management Kinesiotex tape for edema and to create space neck   Manual Traction --                Problem List Patient Active Problem List   Diagnosis Date Noted  . Left arm pain 10/27/2013  . Cervical disc disorder with radiculopathy of cervical region 10/27/2013  . Ischemic chest pain 10/17/2013  . Chest pain 10/17/2013  . Obstructive sleep apnea 06/28/2013  . Dizziness 06/02/2013  . Headache(784.0) 06/02/2013  . Drowsiness 06/02/2013  . Neck pain 06/02/2013  . ABNORMALITY OF GAIT 03/15/2009  . ANKLE PAIN, BILATERAL 03/14/2009  . FOOT PAIN, BILATERAL 03/14/2009  HARRIS,KAREN PTA 11/16/2013, 10:12 AM

## 2013-11-21 ENCOUNTER — Ambulatory Visit: Payer: 59

## 2013-11-21 DIAGNOSIS — M5010 Cervical disc disorder with radiculopathy, unspecified cervical region: Secondary | ICD-10-CM

## 2013-11-21 DIAGNOSIS — Z5189 Encounter for other specified aftercare: Secondary | ICD-10-CM | POA: Diagnosis not present

## 2013-11-21 NOTE — Therapy (Signed)
Physical Therapy Treatment  Patient Details  Name: [name] MRN: [id number] Date of Birth: November 07, 1955  Encounter Date: 11/21/2013      PT End of Session - 11/21/13 1506    Visit Number 5   Number of Visits 12   Date for PT Re-Evaluation 12/14/13   PT Start Time 7416   PT Stop Time 1510   PT Time Calculation (min) 55 min   Activity Tolerance Patient tolerated treatment well      Past Medical History  Diagnosis Date  . Hypertension   . GERD (gastroesophageal reflux disease)   . Lactose intolerance   . MVP (mitral valve prolapse)   . Anxiety   . Pyelonephritis   . Anemia     as a child    Past Surgical History  Procedure Laterality Date  . Appendectomy    . Cholecystectomy    . Dilation and curettage of uterus    . Ankle fracture  10/09    bilateral-right was surgically repaired  . Ankle surgery  10/10    screws removed  . Ankle surgery  2/12    removal of all hardware right ankle  . Arm surgery      plate in left arm  . Breast lumpectomy      right breast    LMP 07/05/2009  Visit Diagnosis:  Cervical disc disorder with radiculopathy of cervical region      Subjective Assessment - 11/21/13 1429    Symptoms (p) No pain currently. Significantly better, per pt report with only one, mild episode of arm pain since last visit. Pt is compliant with HEP.  Pt reports good relief from kinesiotape. Pt even was able to work, pain -free last night for 12 hours.    Pertinent History cervical radiculopathy   Currently in Pain? Yes   Pain Score 0-No pain   Pain Location Arm   Pain Orientation Left   Pain Type Neuropathic pain   Pain Radiating Towards L arm and mid back    Pain Onset More than a month ago   Pain Frequency Intermittent   Multiple Pain Sites No            [facility] Adult PT Treatment/Exercise - 11/21/13 0001    Neck Exercises: Stretches   Upper Trapezius Stretch 3 reps;30 seconds;Other (comment)   Upper Trapezius Stretch Limitations L side  only, manually    Modalities   Modalities Moist Heat;Traction  Mechanical Traction: 10 mins, 10lbs max, 5 lbs min, static    Moist Heat Therapy   Number Minutes Moist Heat 10 Minutes   Moist Heat Location Other (comment)  Cervical spine   Manual Therapy   Manual Therapy Massage;Myofascial release   Massage STM to Bil upper trap and cervical paraspinals.   Manual Traction to c spine manually          PT Education - 11/21/13 1505    Education provided Yes   Education Details Continue HEP.    Person(s) Educated Patient   Methods Explanation;Demonstration   Comprehension Verbalized understanding              Plan - 11/21/13 1512    Clinical Impression Statement Significant improvements from mechanical traction and use of Kinesiotape from last visit. Less episodes of pain with less severity. No pain following treatment. PT did not reapply Kinesiotape today to give skin time to rest between applications due to slight redness in area.    Pt will benefit from skilled therapeutic intervention  in order to improve on the following deficits Pain;Decreased mobility;Postural dysfunction   Rehab Potential Excellent   PT Frequency 2x / week   PT Duration 6 weeks   PT Treatment/Interventions Moist Heat;Manual techniques;Therapeutic exercise;Traction   PT Next Visit Plan mechanical traction, scapular ther ex, Kinesiotape         Problem List Patient Active Problem List   Diagnosis Date Noted  . Left arm pain 10/27/2013  . Cervical disc disorder with radiculopathy of cervical region 10/27/2013  . Ischemic chest pain 10/17/2013  . Chest pain 10/17/2013  . Obstructive sleep apnea 06/28/2013  . Dizziness 06/02/2013  . Headache(784.0) 06/02/2013  . Drowsiness 06/02/2013  . Neck pain 06/02/2013  . ABNORMALITY OF GAIT 03/15/2009  . ANKLE PAIN, BILATERAL 03/14/2009  . FOOT PAIN, BILATERAL 03/14/2009                                               Dollene Cleveland, PT 11/21/2013, 3:23 PM

## 2013-11-23 ENCOUNTER — Ambulatory Visit: Payer: 59 | Admitting: Physical Therapy

## 2013-11-23 DIAGNOSIS — Z5189 Encounter for other specified aftercare: Secondary | ICD-10-CM | POA: Diagnosis not present

## 2013-11-23 DIAGNOSIS — M5010 Cervical disc disorder with radiculopathy, unspecified cervical region: Secondary | ICD-10-CM

## 2013-11-23 NOTE — Therapy (Addendum)
Physical Therapy Treatment  Patient Details  Name: [name] MRN: [id number] Date of Birth: [birth year]  Encounter Date: 11/23/2013      PT End of Session - 11/23/13 1053    Visit Number 6   Number of Visits 12   Date for PT Re-Evaluation 12/14/13   PT Start Time 0803   PT Stop Time 0917   PT Time Calculation (min) 74 min   Activity Tolerance Patient tolerated treatment well      Past Medical History  Diagnosis Date  . Hypertension   . GERD (gastroesophageal reflux disease)   . Lactose intolerance   . MVP (mitral valve prolapse)   . Anxiety   . Pyelonephritis   . Anemia     as a child    Past Surgical History  Procedure Laterality Date  . Appendectomy    . Cholecystectomy    . Dilation and curettage of uterus    . Ankle fracture  10/09    bilateral-right was surgically repaired  . Ankle surgery  10/10    screws removed  . Ankle surgery  2/12    removal of all hardware right ankle  . Arm surgery      plate in left arm  . Breast lumpectomy      right breast    LMP 07/05/2009  Visit Diagnosis:  Cervical disc disorder with radiculopathy of cervical region      Subjective Assessment - 11/23/13 0812    Symptoms Tape helpful. Pain now in [place]. 4/10 (not new) No arm pain.  A little arm pain yesterday with repetitive motions and trunk twisting making flower arrangements.   Currently in Pain? Yes   Pain Score 4    Pain Location Back   Pain Orientation Lower;Mid   Pain Descriptors / Indicators Aching   Pain Type Chronic pain   Aggravating Factors  See subjective above   Multiple Pain Sites No            [facility] Adult PT Treatment/Exercise - 11/23/13 0810    Neck Exercises: Stretches   Upper Trapezius Stretch 1 rep;3 reps   Upper Trapezius Stretch Limitations 1 rep 3 seconds   Neck Exercises: Theraband   Rows Green;10 reps  added to home exercise program   Neck Exercises: Seated   Neck Retraction 5 reps   Modalities   Modalities  Traction;Moist Heat  incerased to 12 lbs pull, intermittant. 5 Lbs pull  15 secon   Moist Heat Therapy   Number Minutes Moist Heat 15 Minutes   Moist Heat Location --  neck, upper back   Manual Therapy   Manual Therapy Myofascial release  upper back, able to softem and increase movement of tissue   Edema Management kinesiotex taping as on 11/16/13          PT Education - 11/23/13 0855    Education provided Yes   Education Details Good posture with craft activities   Person(s) Educated Patient   Methods Explanation;Demonstration   Comprehension Verbalized understanding              Plan - 11/23/13 1055    Clinical Impression Statement No radiating pain, no neck pain.  Progress toward LTG #3.  Focus today on manual, traction, modalities and education   PT Next Visit Plan review band exercise, continue traction and taping.        Problem List Patient Active Problem List   Diagnosis Date Noted  . Left arm pain 10/27/2013  .  Cervical disc disorder with radiculopathy of cervical region 10/27/2013  . Ischemic chest pain 10/17/2013  . Chest pain 10/17/2013  . Obstructive sleep apnea 06/28/2013  . Dizziness 06/02/2013  . Headache(784.0) 06/02/2013  . Drowsiness 06/02/2013  . Neck pain 06/02/2013  . ABNORMALITY OF GAIT 03/15/2009  . ANKLE PAIN, BILATERAL 03/14/2009  . FOOT PAIN, BILATERAL 03/14/2009                                               Jazzlyn Huizenga PTA 11/23/2013, 12:59 PM     PHYSICAL THERAPY DISCHARGE SUMMARY  Visits from Start of Care: 6  Current functional level related to goals / functional outcomes: See above   Remaining deficits: See above   Education / Equipment: Posture, HEP  Plan: Patient agrees to discharge.  Patient goals were not met. Patient is being discharged due to not returning since the last visit.  ????? Pt did not return after last visit - reason unknown.         Romualdo Bolk, PT, DPT 11/21/2014 5:23 PM Phone: [phone] Fax: [phone]

## 2013-11-23 NOTE — Patient Instructions (Addendum)
Instructed patient in ways to avoid twisting with lifting as she works on [employer] and other decorating tasks.Scapular Retraction: Bilateral   Facing anchor, pull arms back, bringing shoulder blades together.1 Repeat __1- -20__ times per set. Do __1__ sets per session. Do __1__ sessions per day.   http://orth.exer.us/176   Copyright  VHI. All rights reserved.

## 2013-11-28 ENCOUNTER — Ambulatory Visit (INDEPENDENT_AMBULATORY_CARE_PROVIDER_SITE_OTHER): Payer: 59 | Admitting: Neurology

## 2013-11-28 DIAGNOSIS — M79602 Pain in left arm: Secondary | ICD-10-CM

## 2013-11-28 DIAGNOSIS — G5602 Carpal tunnel syndrome, left upper limb: Secondary | ICD-10-CM

## 2013-11-28 DIAGNOSIS — M5412 Radiculopathy, cervical region: Secondary | ICD-10-CM

## 2013-11-28 NOTE — Procedures (Signed)
[facility]  [address], [address]  [place], [place] [address] Tel: [phone] Fax:  [phone] Test Date:  11/28/2013  Patient: [name] DOB: Jan 19, 1955 Physician: Narda Amber, DO  Sex: Female Height: 5\' 8"  Ref Phys: Ellouise Newer  ID#: [id number] Temp: 35.9C Technician:    Patient Complaints: This ia a 58 year-old female presenting for evaluation of chronic left arm pain.    NCV & EMG Findings: Extensive electrodiagnostic testing of the left upper extremity and additional studies of the right shows:  1. Left median sensory response is mildly prolonged. Left ulnar and radial sensory responses are within normal limits. 2. Left median and ulnar motor responses are within normal limits. 3. Chronic motor axon loss changes are seen affecting C5-C6 myotomes on the left, without accompanied active denervation. Similar findings are not present in the right upper extremity.   Impression: 1. The electrophysiologic findings are most consistent with a chronic C5-C6 radiculopathy affecting the left upper extremity. Overall these findings are moderate in degree electrically and worse at the C5 nerve root level. 2. Left median neuropathy at or distal to the wrist consistent with the clinical diagnosis of carpal tunnel syndrome; mild in degree electrically.    ___________________________ Narda Amber, DO    Nerve Conduction Studies Anti Sensory Summary Table   Site NR Peak (ms) Norm Peak (ms) P-T Amp (V) Norm P-T Amp  Left Median Anti Sensory (2nd Digit)  Wrist    4.0 <3.6 19.0 >15  Left Radial Anti Sensory (Base 1st Digit)  Wrist    2.0 <2.7 16.7 >14  Left Ulnar Anti Sensory (5th Digit)  Wrist    3.0 <3.1 25.2 >10   Motor Summary Table   Site NR Onset (ms) Norm Onset (ms) O-P Amp (mV) Norm O-P Amp Site1 Site2 Delta-0 (ms) Dist (cm) Vel (m/s) Norm Vel (m/s)  Left Median Motor (Abd Poll Brev)  Wrist    2.8 <4.0 8.5 >6 Elbow Wrist 5.6 30.0 54 >50  Elbow     8.4  8.0         Left Ulnar Motor (Abd Dig Minimi)  Wrist    2.0 <3.1 7.0 >7 B Elbow Wrist 4.7 27.0 57 >50  B Elbow    6.7  6.3  A Elbow B Elbow 1.4 8.5 61 >50  A Elbow    8.1  6.1          EMG   Side Muscle Ins Act Fibs Psw Fasc Number Recrt Dur Dur. Amp Amp. Poly Poly. Comment  Left 1stDorInt Nml Nml Nml Nml Nml Nml Nml Nml Nml Nml Nml Nml N/A  Left Ext Indicis Nml Nml Nml Nml Nml Nml Nml Nml Nml Nml Nml Nml N/A  Left PronatorTeres Nml Nml Nml Nml 1- Mod-R Few 1+ Nml Nml Nml Nml N/A  Left Biceps Nml Nml Nml Nml 1- Mod-R Some 1+ Some 1+ Nml Nml N/A  Left Triceps Nml Nml Nml Nml Nml Nml Nml Nml Nml Nml Nml Nml N/A  Left BrachioRad Nml Nml Nml Nml 1- Mod-R Some 1+ Some 1+ Nml Nml N/A  Left Deltoid Nml Nml Nml Nml 1- Rapid Many 1+ Many 1+ Some 1+ N/A  Left Infraspinatus Nml Nml Nml Nml 1- Rapid Many 1+ Many 1+ Many 1+ N/A  Right Infraspinatus Nml Nml Nml Nml Nml Nml Nml Nml Nml Nml Nml Nml N/A  Right 1stDorInt Nml Nml Nml Nml Nml Nml Nml Nml Nml Nml Nml Nml N/A  Right PronatorTeres  Nml Nml Nml Nml Nml Nml Nml Nml Nml Nml Nml Nml N/A  Right Biceps Nml Nml Nml Nml Nml Nml Nml Nml Nml Nml Nml Nml N/A  Right Triceps Nml Nml Nml Nml Nml Nml Nml Nml Nml Nml Nml Nml N/A  Right Deltoid Nml Nml Nml Nml Nml Nml Nml Nml Nml Nml Nml Nml N/A      Waveforms:

## 2013-11-29 ENCOUNTER — Encounter: Payer: 59 | Admitting: Neurology

## 2013-11-29 ENCOUNTER — Telehealth: Payer: Self-pay | Admitting: Neurology

## 2013-11-29 NOTE — Telephone Encounter (Signed)
Discussed EMG findings with patient showing moderate left C5-6 radiculopathy and mild left carpal tunnel syndrome. She finished PT last week, feels better. Has f/u next week. F/u as scheduled.

## 2013-12-05 ENCOUNTER — Ambulatory Visit (INDEPENDENT_AMBULATORY_CARE_PROVIDER_SITE_OTHER): Payer: 59 | Admitting: Neurology

## 2013-12-05 ENCOUNTER — Encounter: Payer: Self-pay | Admitting: Neurology

## 2013-12-05 VITALS — BP 130/84 | HR 60 | Resp 16 | Ht 68.0 in | Wt 234.0 lb

## 2013-12-05 DIAGNOSIS — M5412 Radiculopathy, cervical region: Secondary | ICD-10-CM

## 2013-12-05 DIAGNOSIS — G5602 Carpal tunnel syndrome, left upper limb: Secondary | ICD-10-CM

## 2013-12-05 DIAGNOSIS — R400 Somnolence: Secondary | ICD-10-CM

## 2013-12-05 NOTE — Patient Instructions (Signed)
1. Start wearing left wrist splint to prevent worsening of carpal tunnel syndrome  2. Continue home PT exercises 3. Follow-up with Ortho for ankle and elbow 4. Follow-up in 4-5 months

## 2013-12-05 NOTE — Progress Notes (Signed)
NEUROLOGY FOLLOW UP OFFICE NOTE  Gricelda Foland Whitis [id number]  HISTORY OF PRESENT ILLNESS: I had the pleasure of seeing Breeanna Galgano in follow-up in the neurology clinic on 12/05/2013.  The patient was last seen 6 weeks ago for new symptoms of left arm pain. Cardiac workup normal. Prior MRI C-spine had shown multiple cervical spondylotic changes most notable at C5-6 level. She had an EMG/NCV of the left UE which did show chronic C5-C6 radiculopathy affecting the left upper extremity, moderate in degree electrically and worse at the C5 nerve root level.She had mild left median neuropathy at or distal to the wrist consistent with the clinical diagnosis of carpal tunnel syndrome. She underwent PT and reports that the pain is better. She has mild left upper arm pain that is more bearable.   She had initially been seen for episodes of significant drowsiness after eating concentrated sweets. Her MRI brain was normal. Sleep study showed moderate OSA. She is now on CPAP and feels significantly better during the daytime. No further episodes of excessive daytime drowsiness.  HPI: This is a [place] 58 yo RH woman with a history of hypertension and migraines, in her usual state of health until March 2015 when she started having significant drowsiness after eating concentrated sweets. She noticed that 30-60 minutes after eating a honeybun or cinnamon melts, she could not keep her head up, "unreasonably sleepy." A month ago she ate an apple crisp from [employer] and kept falling asleep at stoplights. She eventually made it to her house and recalls opening the garage door, then woke up in her car 2 hours later. She was staggering as she got out of the car, and had a hard time getting the car door open. She reports having her glucose levels checked 2 weeks ago pre- and post-ingestion of an apple crisp, she started having similar significant drowsiness, dizziness, however her glucose levels apparently did not  change significantly (89 to 93, per patient). She had an EKG at that time, which was reported as normal. Vital signs showed pulse of 53, BP lying down 166/86, sitting 180/100, standing 170/108. This was the last episode, she has avoided concentrated sweets since then. She reports having dizzy spells daily lasting for an hour, occurring even without ingestion of concentrated sweets. She was in the grocery store one time and felt dizzy, "like a fog in my head," hot, and feeling as if she would pass out. She denied any palpitations, chest pain, or shortness of breath. At times the dizzy episodes are associated with a slight headache.   Diagnostic Data: TSH and B12 normal. I personally reviewed MRI brain which did not show any acute changes. There is scattered patchy white matter changes bilaterally consistent with chronic microvascular disease.   Sleep study showed moderate obstructive sleep apnea/hypopnea syndrome, AHI 28.6 per hour with supine events. Very loud snoring with oxygen desaturation to a nadir of 88% on room air. She had a successful CPAP titration to 7 CWP, AHI 0 per hour. She wore a small [employer] Eson nasal mask with heated humidifier. Snoring was prevented and mean oxygen saturation held 93.6% on room air with CPAP.   MRI cervical spine last 06/2013 showed multilevel cervical spondylotic changes most notable C5-6 level. At C3-4 broad -based disc osteophyte complex greater to left. Crowding of the ventral nerve roots greater on the left. Moderate to marked left foraminal narrowing. Minimal to mild right foraminal narrowing. C4-5 multifactorial mild spinal stenosis. Crowding of the ventral nerve  roots without cord compression. Moderate left-sided and mild right-sided foraminal narrowing. C5-6 broad-based disc osteophyte complex. Spinal stenosis with crowding of the ventral nerve roots and very mild cord contact/ deformity. Mild to moderate foraminal narrowing bilaterally. C6-7 broad-based  disc osteophyte complex slightly greater to left. Spinal stenosis with crowding of the ventral nerve roots and mild cord contact/ deformity slightly greater on left. Mild to moderate left-sided and mild right-sided foraminal narrowing. C7-T1 minimal right uncinate hypertrophy and foraminal narrowing. Bulge/ spur greatest right paracentral position with slight crowding of the right ventral nerve root.  PAST MEDICAL HISTORY: Past Medical History  Diagnosis Date  . Hypertension   . GERD (gastroesophageal reflux disease)   . Lactose intolerance   . MVP (mitral valve prolapse)   . Anxiety   . Pyelonephritis   . Anemia     as a child    MEDICATIONS: Current Outpatient Prescriptions on File Prior to Visit  Medication Sig Dispense Refill  . amLODipine (NORVASC) 5 MG tablet Take 5 mg by mouth daily.    Marland Kitchen aspirin 81 MG tablet Take 81 mg by mouth daily.    . Calcium-Magnesium-Vitamin D (CALCIUM 1200+D3) 600-40-500 MG-MG-UNIT TB24 Take 3 tablets by mouth daily.    . cloNIDine (CATAPRES) 0.1 MG tablet Take 0.1 mg by mouth 2 (two) times daily.     Marland Kitchen esomeprazole (NEXIUM) 40 MG capsule Take 40 mg by mouth daily at 12 noon.    . fexofenadine (ALLEGRA) 180 MG tablet Take 180 mg by mouth daily.    Marland Kitchen ibuprofen (ADVIL,MOTRIN) 800 MG tablet Take 800 mg by mouth. Every 12 hours prn    . losartan (COZAAR) 100 MG tablet Take 100 mg by mouth daily.    . metoprolol succinate (TOPROL-XL) 50 MG 24 hr tablet Take 25 mg by mouth daily. Take with or immediately following a meal.    . nitroGLYCERIN (NITROSTAT) 0.4 MG SL tablet Place 1 tablet (0.4 mg total) under the tongue every 5 (five) minutes as needed for chest pain. (Patient not taking: Reported on 12/05/2013) 30 tablet 12   No current facility-administered medications on file prior to visit.    ALLERGIES: Allergies  Allergen Reactions  . Crab [Shellfish Allergy] Anaphylaxis  . Ivp Dye [Iodinated Diagnostic Agents] Anaphylaxis  . Nizatidine Anaphylaxis  .  Dilaudid [Hydromorphone Hcl] Nausea And Vomiting    Pt can tolerate morphine  . Hydrocodone-Acetaminophen Nausea And Vomiting  . Oxycodone-Acetaminophen Nausea And Vomiting    FAMILY HISTORY: Family History  Problem Relation Age of Onset  . Diabetes Maternal Grandmother   . Ovarian cancer Paternal Grandmother   . Prostate cancer Paternal Grandfather   . Ovarian cancer Paternal Aunt   . Breast cancer Maternal Aunt     post menopause  . Breast cancer Other     3 1st cousins, mastectomy  . Hypertension Mother   . Heart disease Mother   . Heart disease Father   . Ulcers Brother   . Goiter Mother   . Other Brother     polio  . Pyelonephritis Daughter   . Breast cancer Sister     SOCIAL HISTORY: History   Social History  . Marital Status: Married    Spouse Name: N/A    Number of Children: 2  . Years of Education: N/A   Occupational History  .  [employer]   Social History Main Topics  . Smoking status: Never Smoker   . Smokeless tobacco: Never Used  . Alcohol Use: No  .  Drug Use: No  . Sexual Activity:    Partners: Male   Other Topics Concern  . Not on file   Social History Narrative    REVIEW OF SYSTEMS: Constitutional: No fevers, chills, or sweats, no generalized fatigue, change in appetite Eyes: No visual changes, double vision, eye pain Ear, nose and throat: No hearing loss, ear pain, nasal congestion, sore throat Cardiovascular: No chest pain, palpitations Respiratory:  No shortness of breath at rest or with exertion, wheezes GastrointestinaI: No nausea, vomiting, diarrhea, abdominal pain, fecal incontinence Genitourinary:  No dysuria, urinary retention or frequency Musculoskeletal:  No neck pain, +back pain Integumentary: No rash, pruritus, skin lesions Neurological: as above Psychiatric: No depression, insomnia, anxiety Endocrine: No palpitations, fatigue, diaphoresis, mood swings, change in appetite, change in weight, increased  thirst Hematologic/Lymphatic:  No anemia, purpura, petechiae. Allergic/Immunologic: no itchy/runny eyes, nasal congestion, recent allergic reactions, rashes  PHYSICAL EXAM: Filed Vitals:   12/05/13 0857  BP: 130/84  Pulse: 60  Resp: 16   General: No acute distress Head:  Normocephalic/atraumatic Neck: supple, no paraspinal tenderness, full range of motion Heart:  Regular rate and rhythm Lungs:  Clear to auscultation bilaterally Back: No paraspinal tenderness Skin/Extremities: No rash, right ankle and left elbow swelling Neurological Exam: alert and oriented to person, place, and time. No aphasia or dysarthria. Fund of knowledge is appropriate.  Recent and remote memory are intact.  Attention and concentration are normal.    Able to name objects and repeat phrases. Cranial nerves: Pupils equal, round, reactive to light.  Fundoscopic exam unremarkable, no papilledema. Extraocular movements intact with no nystagmus. Visual fields full. Facial sensation intact. No facial asymmetry. Tongue, uvula, palate midline.  Motor: Bulk and tone normal, muscle strength 5/5 throughout with no pronator drift.  Sensation to light touch intact.  No extinction to double simultaneous stimulation.  Deep tendon reflexes 1+ throughout, toes downgoing.  Finger to nose testing intact.  Gait narrow-based and steady, able to tandem walk adequately.  Romberg negative.  IMPRESSION: This is a [place] 58 yo RH woman with a history of hypertension and migraines, who initially presented with episodes of excessive drowsiness after ingestion of concentrated sweets. Her sleep study showed moderate obstructive sleep apnea. It is possible the sleep apnea causes some daytime drowsiness that is worsened after the eats concentrated sweets, and she has been feeling better since CPAP initiation. She presented with new symptoms of left arm pain and numbness. EMG/NCV showed left C-5 radiculopathy and left carpal tunnel syndrome. Her  cervical MRI does show degenerative changes, more on the left side. She is feeling better after PT. She still has some mild left arm pain, we discussed starting membrane stabilizing agents such as Neurontin for radiculopathy pain, she would like to hold off for now. She will start using a wrist splint on the left to prevent worsening of carpal tunnel syndrome. She will speak with her orthopedic surgeon regarding the right ankle and left elbow swelling. She will follow-up in 4-5 months and knows to call our office for any changes.  Thank you for allowing me to participate in her care.  Please do not hesitate to call for any questions or concerns.  The duration of this appointment visit was 15 minutes of face-to-face time with the patient.  Greater than 50% of this time was spent in counseling, explanation of diagnosis, planning of further management, and coordination of care.   Ellouise Newer, M.D.   CC: Dr. Inda Merlin

## 2013-12-14 ENCOUNTER — Ambulatory Visit (INDEPENDENT_AMBULATORY_CARE_PROVIDER_SITE_OTHER): Payer: 59 | Admitting: Sports Medicine

## 2013-12-14 ENCOUNTER — Encounter: Payer: Self-pay | Admitting: Sports Medicine

## 2013-12-14 VITALS — BP 182/79 | HR 86 | Ht 68.0 in | Wt 232.0 lb

## 2013-12-14 DIAGNOSIS — M79676 Pain in unspecified toe(s): Secondary | ICD-10-CM

## 2013-12-14 DIAGNOSIS — R269 Unspecified abnormalities of gait and mobility: Secondary | ICD-10-CM

## 2013-12-14 DIAGNOSIS — M25579 Pain in unspecified ankle and joints of unspecified foot: Secondary | ICD-10-CM

## 2013-12-14 NOTE — Assessment & Plan Note (Signed)
This correct well with new orthotics  Face to face and preparation time was 45 mins

## 2013-12-14 NOTE — Assessment & Plan Note (Signed)
Bilateral bunions and chronic foot pain  Padding added to orthotic

## 2013-12-14 NOTE — Assessment & Plan Note (Signed)
S/P bilateral fractures  Some DJD RT  Continue to use compression sleeves for walking/ standing

## 2013-12-14 NOTE — Progress Notes (Signed)
Patient ID: [name], female   DOB: [birth year], 58 y.o.   MRN: [id number]  Subjective:  Patient presents for custom orthotics.  She had a significant injury approximately 5 years ago where she fell at church breaking both ankles.  At that time Dr. 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  She has been using body helix ankle compression with relief but these have worn out.  She had custom orthotics in 2011 and these have helped reduce the ankle pain and the swelling significantly but they have begun to wear out, especially along right lateral forefoot.  Custom orthotics created to help alleviate bilateral overpronation and bilateral bunions.  After creation of orthotics, right foot was in neutral position and left foot was neutral to slightly supinated.  Walking in new orthotics were comfortable to her.  She was given a new ankle compression sleeve to continue to use to help with her ankle swelling.  Exam NAD Bilat bunions Forefoot breakdown Loss of long arch Calcaneal valgus Pronated gait  Scar on RT ankle with some chronic DJD changes Motion is limited in PF inversion/ slt limit in dorsiflexion Ligaments stable  LT ankle Ankle: No visible erythema or swelling. Range of motion is full in all directions. Strength is 5/5 in all directions. Mild laxity of lateral ligaments Stable medial ligaments; squeeze test and kleiger test unremarkable; Talar dome nontender; No pain at base of 5th MT; No tenderness over cuboid; No tenderness over N spot or navicular prominence No tenderness on posterior aspects of lateral and medial malleolus No sign of peroneal tendon subluxations; Negative tarsal tunnel tinel's Able to walk 4 steps.   Assessment/Plan:  Patient is a 58 y/o F with bilateral foot pain.  1.  Bilateral Foot Pain Patient was fitted for  a standard, cushioned, semi-rigid orthotic. The orthotic was heated and afterward the patient stood on the orthotic blank positioned on the orthotic stand. The patient was positioned in subtalar neutral position and 10 degrees of ankle dorsiflexion in a weight bearing stance. After completion of molding, a stable base was applied to the orthotic blank. The blank was ground to a stable position for weight bearing. Size: 12 red EVA blank Base: large med blue EVA Bunion pads added to each orthotic  Patient seen and examined with Dr. Oneida Alar.  Creig Hines PGY-3 Family Medicine  Agree with this assessment and plan  Stefanie Libel, MD

## 2014-01-03 ENCOUNTER — Ambulatory Visit (INDEPENDENT_AMBULATORY_CARE_PROVIDER_SITE_OTHER): Payer: 59 | Admitting: Sports Medicine

## 2014-01-03 ENCOUNTER — Encounter: Payer: Self-pay | Admitting: Sports Medicine

## 2014-01-03 VITALS — BP 150/84 | HR 66 | Ht 68.0 in | Wt 234.0 lb

## 2014-01-03 DIAGNOSIS — M25522 Pain in left elbow: Secondary | ICD-10-CM

## 2014-01-03 NOTE — Progress Notes (Signed)
Subjective:    Patient ID: [name], female    DOB: [birth year], 58 y.o.   MRN: [id number]  HPI chief complaint: "I'm here for an elbow ultrasound"  Very [place] right-hand-dominant 58 year old female comes in today complaining of long-standing medial left elbow pain. She describes an aching discomfort that begins in the medial elbow and radiates up into the medial upper arm. She denies any recent trauma. Only previous trauma was an injury many years ago which required 6 weeks of cast immobilization but she made a full recovery from that injury. She denies radiating pain into her forearm or hand. She gets occasional numbness and tingling into her forearm and hand but nothing debilitating. She does believe she has had some mild soft tissue swelling along the medial elbow. She has a history of cervical spinal stenosis and has been undergoing treatment for this at [facility]. She has had both an MRI of her cervical spine as well as an EMG done. She has recently completed physical therapy and did note some benefit from this. She denies any lateral elbow pain. Her pain is constantly present and is not affected by any type of shoulder or elbow motion. Her neurologist had previously discussed the possibility of Neurontin but she has a history of sleep apnea and is concerned about the side effect of sedation with this medication.  Past medical history reviewed Medications reviewed Allergies reviewed    Review of Systems     Objective:   Physical Exam Well-developed, well-nourished. No acute distress. Awake alert and oriented 3. Vital signs are reviewed.   Left elbow: Full range of motion. No effusion. No significant soft tissue swelling. No tenderness to palpation at the medial epicondyle nor along the common flexor muscle body. There is some slight tenderness to palpation just superior to the medial epicondyle in the area of the distal humerus. No significant triceps tenderness to  palpation. Negative Tinel's at the cubital tunnel. Good grip strength. No atrophy. Good radial ulnar pulses.   MSK ultrasound of the left elbow was performed. Limited images of the medial elbow were obtained. There is a small spur off of the tip of the medial epicondyle with very mild hypoechoic changes in this area. The majority of the common extensor tendon appears to be within normal limits without any obvious tears. Transverse view of the ulnar nerve in the cubital tunnel also appears unremarkable.  MRI of her cervical spine done in June 2015 demonstrates multilevel degenerative changes and stenosis. An EMG/nerve conduction study done in November 2015 showed findings consistent with a chronic C5-C6 radiculopathy worse at the C5 nerve root level. She also had evidence of mild carpal tunnel syndrome.     Assessment & Plan:  Chronic medial sided left elbow pain likely referred pain from cervical spinal stenosis   Patient's physical exam findings and msk ultrasound do not support the diagnosis of any sort of significant medial epicondylitis. The fact that her pain is constant in nature coupled with the fact that she had noticeable improvement with physical therapy (particularly traction) and the findings on her cervical spine MRI and EMG I believe that her pain in her medial elbow is likely referred pain from her cervical spine. I explained to her that a diagnostic/therapeutic cervical ESI may help answer this question. I've asked that she discuss this further with her treating neurologist. In the meantime I will provide her with a body helix compression sleeve for the left elbow but she is instructed to  discontinue it immediately if she has worsening symptoms. Follow-up with me when necessary.

## 2014-02-20 ENCOUNTER — Encounter: Payer: Self-pay | Admitting: Certified Nurse Midwife

## 2014-02-20 ENCOUNTER — Ambulatory Visit: Payer: 59 | Admitting: Obstetrics & Gynecology

## 2014-02-20 ENCOUNTER — Ambulatory Visit (INDEPENDENT_AMBULATORY_CARE_PROVIDER_SITE_OTHER): Payer: 59 | Admitting: Certified Nurse Midwife

## 2014-02-20 VITALS — BP 134/74 | HR 70 | Resp 16 | Ht 68.25 in | Wt 232.0 lb

## 2014-02-20 DIAGNOSIS — Z01419 Encounter for gynecological examination (general) (routine) without abnormal findings: Secondary | ICD-10-CM

## 2014-02-20 DIAGNOSIS — R350 Frequency of micturition: Secondary | ICD-10-CM

## 2014-02-20 DIAGNOSIS — Z0000 Encounter for general adult medical examination without abnormal findings: Secondary | ICD-10-CM

## 2014-02-20 DIAGNOSIS — Z124 Encounter for screening for malignant neoplasm of cervix: Secondary | ICD-10-CM

## 2014-02-20 LAB — POCT URINALYSIS DIPSTICK
BILIRUBIN UA: NEGATIVE
Blood, UA: NEGATIVE
Glucose, UA: NEGATIVE
KETONES UA: NEGATIVE
Leukocytes, UA: NEGATIVE
Nitrite, UA: NEGATIVE
PH UA: 5
PROTEIN UA: NEGATIVE
UROBILINOGEN UA: NEGATIVE

## 2014-02-20 NOTE — Patient Instructions (Signed)
EXERCISE AND DIET:  We recommended that you start or continue a regular exercise program for good health. Regular exercise means any activity that makes your heart beat faster and makes you sweat.  We recommend exercising at least 30 minutes per day at least 3 days a week, preferably 4 or 5.  We also recommend a diet low in fat and sugar.  Inactivity, poor dietary choices and obesity can cause diabetes, heart attack, stroke, and kidney damage, among others.    ALCOHOL AND SMOKING:  Women should limit their alcohol intake to no more than 7 drinks/beers/glasses of wine (combined, not each!) per week. Moderation of alcohol intake to this level decreases your risk of breast cancer and liver damage. And of course, no recreational drugs are part of a healthy lifestyle.  And absolutely no smoking or even second hand smoke. Most people know smoking can cause heart and lung diseases, but did you know it also contributes to weakening of your bones? Aging of your skin?  Yellowing of your teeth and nails?  CALCIUM AND VITAMIN D:  Adequate intake of calcium and Vitamin D are recommended.  The recommendations for exact amounts of these supplements seem to change often, but generally speaking 600 mg of calcium (either carbonate or citrate) and 800 units of Vitamin D per day seems prudent. Certain women may benefit from higher intake of Vitamin D.  If you are among these women, your doctor will have told you during your visit.    PAP SMEARS:  Pap smears, to check for cervical cancer or precancers,  have traditionally been done yearly, although recent scientific advances have shown that most women can have pap smears less often.  However, every woman still should have a physical exam from her gynecologist every year. It will include a breast check, inspection of the vulva and vagina to check for abnormal growths or skin changes, a visual exam of the cervix, and then an exam to evaluate the size and shape of the uterus and  ovaries.  And after 58 years of age, a rectal exam is indicated to check for rectal cancers. We will also provide age appropriate advice regarding health maintenance, like when you should have certain vaccines, screening for sexually transmitted diseases, bone density testing, colonoscopy, mammograms, etc.   MAMMOGRAMS:  All women over 40 years old should have a yearly mammogram. Many facilities now offer a "3D" mammogram, which may cost around $50 extra out of pocket. If possible,  we recommend you accept the option to have the 3D mammogram performed.  It both reduces the number of women who will be called back for extra views which then turn out to be normal, and it is better than the routine mammogram at detecting truly abnormal areas.    COLONOSCOPY:  Colonoscopy to screen for colon cancer is recommended for all women at age 50.  We know, you hate the idea of the prep.  We agree, BUT, having colon cancer and not knowing it is worse!!  Colon cancer so often starts as a polyp that can be seen and removed at colonscopy, which can quite literally save your life!  And if your first colonoscopy is normal and you have no family history of colon cancer, most women don't have to have it again for 10 years.  Once every ten years, you can do something that may end up saving your life, right?  We will be happy to help you get it scheduled when you are ready.    Be sure to check your insurance coverage so you understand how much it will cost.  It may be covered as a preventative service at no cost, but you should check your particular policy.     Urinary Frequency The number of times a normal person urinates depends upon how much liquid they take in and how much liquid they are losing. If the temperature is hot and there is high humidity, then the person will sweat more and usually breathe a little more frequently. These factors decrease the amount of frequency of urination that would be considered normal. The amount  you drink is easily determined, but the amount of fluid lost is sometimes more difficult to calculate.  Fluid is lost in two ways:  Sensible fluid loss is usually measured by the amount of urine that you get rid of. Losses of fluid can also occur with diarrhea.  Insensible fluid loss is more difficult to measure. It is caused by evaporation. Insensible loss of fluid occurs through breathing and sweating. It usually ranges from a little less than a quart to a little more than a quart of fluid a day. In normal temperatures and activity levels, the average person may urinate 4 to 7 times in a 24-hour period. Needing to urinate more often than that could indicate a problem. If one urinates 4 to 7 times in 24 hours and has large volumes each time, that could indicate a different problem from one who urinates 4 to 7 times a day and has small volumes. The time of urinating is also important. Most urinating should be done during the waking hours. Getting up at night to urinate frequently can indicate some problems. CAUSES  The bladder is the organ in your lower abdomen that holds urine. Like a balloon, it swells some as it fills up. Your nerves sense this and tell you it is time to head for the bathroom. There are a number of reasons that you might feel the need to urinate more often than usual. They include:  Urinary tract infection. This is usually associated with other signs such as burning when you urinate.  In men, problems with the prostate (a walnut-size gland that is located near the tube that carries urine out of your body). There are two reasons why the prostate can cause an increased frequency of urination:  An enlarged prostate that does not let the bladder empty well. If the bladder only half empties when you urinate, then it only has half the capacity to fill before you have to urinate again.  The nerves in the bladder become more hypersensitive with an increased size of the prostate even if  the bladder empties completely.  Pregnancy.  Obesity. Excess weight is more likely to cause a problem for women than for men.  Bladder stones or other bladder problems.  Caffeine.  Alcohol.  Medications. For example, drugs that help the body get rid of extra fluid (diuretics) increase urine production. Some other medicines must be taken with lots of fluids.  Muscle or nerve weakness. This might be the result of a spinal cord injury, a stroke, multiple sclerosis, or Parkinson disease.  Long-standing diabetes can decrease the sensation of the bladder. This loss of sensation makes it harder to sense the bladder needs to be emptied. Over a period of years, the bladder is stretched out by constant overfilling. This weakens the bladder muscles so that the bladder does not empty well and has less capacity to fill with new urine.  Interstitial cystitis (also called painful bladder syndrome). This condition develops because the tissues that line the inside of the bladder are inflamed (inflammation is the body's way of reacting to injury or infection). It causes pain and frequent urination. It occurs in women more often than in men. DIAGNOSIS   To decide what might be causing your urinary frequency, your health care provider will probably:  Ask about symptoms you have noticed.  Ask about your overall health. This will include questions about any medications you are taking.  Do a physical examination.  Order some tests. These might include:  A blood test to check for diabetes or other health issues that could be contributing to the problem.  Urine testing. This could measure the flow of urine and the pressure on the bladder.  A test of your neurological system (the brain, spinal cord, and nerves). This is the system that senses the need to urinate.  A bladder test to check whether it is emptying completely when you urinate.  Cystoscopy. This test uses a thin tube with a tiny camera on it.  It offers a look inside your urethra and bladder to see if there are problems.  Imaging tests. You might be given a contrast dye and then asked to urinate. X-rays are taken to see how your bladder is working. TREATMENT  It is important for you to be evaluated to determine if the amount or frequency that you have is unusual or abnormal. If it is found to be abnormal, the cause should be determined and this can usually be found out easily. Depending upon the cause, treatment could include medication, stimulation of the nerves, or surgery. There are not too many things that you can do as an individual to change your urinary frequency. It is important that you balance the amount of fluid intake needed to compensate for your activity and the temperature. Medical problems will be diagnosed and taken care of by your physician. There is no particular bladder training such as Kegel exercises that you can do to help urinary frequency. This is an exercise that is usually recommended for people who have leaking of urine when they laugh, cough, or sneeze. HOME CARE INSTRUCTIONS   Take any medications your health care provider prescribed or suggested. Follow the directions carefully.  Practice any lifestyle changes that are recommended. These might include:  Drinking less fluid or drinking at different times of the day. If you need to urinate often during the night, for example, you may need to stop drinking fluids early in the evening.  Cutting down on caffeine or alcohol. They both can make you need to urinate more often than normal. Caffeine is found in coffee, tea, and sodas.  Losing weight, if that is recommended.  Keep a journal or a log. You might be asked to record how much you drink and when and where you feel the need to urinate. This will also help evaluate how well the treatment provided by your physician is working. SEEK MEDICAL CARE IF:   Your need to urinate often gets worse.  You feel  increased pain or irritation when you urinate.  You notice blood in your urine.  You have questions about any medications that your health care provider recommended.  You notice blood, pus, or swelling at the site of any test or treatment procedure.  You develop a fever of more than 100.56F (38.1C). SEEK IMMEDIATE MEDICAL CARE IF:  You develop a fever of more than 102.41F (38.9C).  Document Released: 10/18/2008 Document Revised: 05/08/2013 Document Reviewed: 10/18/2008 [facility] Patient Information 2015 [place], [place]. This information is not intended to replace advice given to you by your health care provider. Make sure you discuss any questions you have with your health care provider.

## 2014-02-20 NOTE — Progress Notes (Signed)
59 y.o. G4P2 Married African American  Fe here for annual exam. Menopausal no vaginal bleeding or vaginal dryness. Sees Dr. Inda Merlin for aex, medication management and labs. Hypertension and cholesterol management with medication stable. Continues to have mild stress incontinence,urinary frequency, and occasional pain with urination. Was given medication for with [facility] and had resulting hypertension increase. Patient now on steroids for allergies, which has increased weight. Patient had Cardiac cath for continued pain, but was normal and was determined she has spinal and cervical stenosis. Uses C-Pap for  Sleep apnea now. No other health issues today.  Patient's last menstrual period was 07/05/2009.          Sexually active: Yes.    The current method of family planning is post menopausal status.    Exercising: No.  exercise Smoker:  yes  Health Maintenance: Pap:  11-05-11 neg MMG: 02-09-13 category b density, birads 1:neg Colonoscopy:  8/09 f/u 66yrs BMD:   02-09-13  Repeat 4 years TDaP:  2012 Labs: Poct urine-neg Self breast exam: done monthly   reports that she has never smoked. She has never used smokeless tobacco. She reports that she does not drink alcohol or use illicit drugs.  Past Medical History  Diagnosis Date  . Hypertension   . GERD (gastroesophageal reflux disease)   . Lactose intolerance   . MVP (mitral valve prolapse)   . Anxiety   . Pyelonephritis   . Anemia     as a child  . Sleep apnea   . Spinal stenosis     Past Surgical History  Procedure Laterality Date  . Appendectomy    . Cholecystectomy    . Dilation and curettage of uterus    . Ankle fracture  10/09    bilateral-right was surgically repaired  . Ankle surgery  10/10    screws removed  . Ankle surgery  2/12    removal of all hardware right ankle  . Arm surgery      plate in left arm  . Breast lumpectomy      right breast    Current Outpatient Prescriptions  Medication Sig Dispense Refill  .  amLODipine (NORVASC) 5 MG tablet Take 5 mg by mouth daily.    Marland Kitchen aspirin 81 MG tablet Take 81 mg by mouth daily.    . Calcium-Magnesium-Vitamin D (CALCIUM 1200+D3) 600-40-500 MG-MG-UNIT TB24 Take 3 tablets by mouth daily.    . cloNIDine (CATAPRES) 0.1 MG tablet Take 0.1 mg by mouth 2 (two) times daily.     Marland Kitchen esomeprazole (NEXIUM) 40 MG capsule Take 40 mg by mouth daily at 12 noon.    . fexofenadine (ALLEGRA) 180 MG tablet Take 180 mg by mouth daily.    Marland Kitchen ibuprofen (ADVIL,MOTRIN) 800 MG tablet Take 800 mg by mouth. Every 12 hours prn    . losartan (COZAAR) 100 MG tablet Take 100 mg by mouth daily.    . metoprolol succinate (TOPROL-XL) 50 MG 24 hr tablet Take 25 mg by mouth daily. Take with or immediately following a meal.    . nitroGLYCERIN (NITROSTAT) 0.4 MG SL tablet Place 1 tablet (0.4 mg total) under the tongue every 5 (five) minutes as needed for chest pain. 30 tablet 12   No current facility-administered medications for this visit.    Family History  Problem Relation Age of Onset  . Diabetes Maternal Grandmother   . Ovarian cancer Paternal Grandmother   . Prostate cancer Paternal Grandfather   . Ovarian cancer Paternal Aunt   .  Breast cancer Maternal Aunt     post menopause  . Breast cancer Other     3 1st cousins, mastectomy  . Hypertension Mother   . Heart disease Mother   . Heart disease Father   . Ulcers Brother   . Goiter Mother   . Other Brother     polio  . Pyelonephritis Daughter   . Breast cancer Sister     ROS:  Pertinent items are noted in HPI.  Otherwise, a comprehensive ROS was negative.  Exam:   BP 112/68 mmHg  Pulse 70  Resp 16  Ht 5' 8.25" (1.734 m)  Wt 232 lb (105.235 kg)  BMI 35.00 kg/m2  LMP 07/05/2009 Height: 5' 8.25" (173.4 cm) Ht Readings from Last 3 Encounters:  02/20/14 5' 8.25" (1.734 m)  01/03/14 5\' 8"  (1.727 m)  12/14/13 5\' 8"  (1.727 m)    General appearance: alert, cooperative and appears stated age Head: Normocephalic, without  obvious abnormality, atraumatic Neck: no adenopathy, supple, symmetrical, trachea midline and thyroid normal to inspection and palpation Lungs: clear to auscultation bilaterally Breasts: normal appearance, no masses or tenderness, No nipple retraction or dimpling, No nipple discharge or bleeding, No axillary or supraclavicular adenopathy Heart: regular rate and rhythm Abdomen: soft, non-tender; no masses,  no organomegaly, negative suprapubic Extremities: extremities normal, atraumatic, no cyanosis or edema Skin: Skin color, texture, turgor normal. No rashes or lesions CVAT: negative bilateral Lymph nodes: Cervical, supraclavicular, and axillary nodes normal. No abnormal inguinal nodes palpated Neurologic: Grossly normal   Pelvic: External genitalia:  no lesions              Urethra:  normal appearing urethra with no masses, tenderness or lesions  Bladder non tender, urethral meatus non tender              Bartholin's and Skene's: normal                 Vagina: normal appearing vagina with normal color and discharge, no lesions              Cervix: normal, non tender, no lesions              Pap taken: Yes.   Bimanual Exam:  Uterus:  normal size, contour, position, consistency, mobility, non-tender and retroverted              Adnexa: normal adnexa and no mass, fullness, tenderness               Rectovaginal: Confirms               Anus:  normal sphincter tone, no lesions  Chaperone present: Yes  A:  Well Woman with normal exam  Menopausal no HRT.  Hypertension/cholesterol management with PCP  Urinary frequency R/O UTI  Obesity on weight loss now with [facility]  Family history of breast cancer sister BRACA negative,/ovarian cancer PGM,PA,  Patient aware of genetic screening option  Sleep apnea with C-Pap use now  P:   Reviewed health and wellness pertinent to exam  Aware of need to evaluate if vaginal bleeding  Continue follow up with MD as indicated  Discussed increase  weight and caffeine use can increase frequency also Discussed trying to not hold urine for long periods of time which can increase risk of infection and stress incontinence. Patient encouraged to use cranberry tablets daily to see if change. Aware of UTI warning symptoms.  ZSW:FUXNA culture  Encouraged to continue weight loss program  Pap smear taken today with [facility]   counseled on breast self exam, mammography screening, adequate intake of calcium and vitamin D, diet and exercise, kegel exercise.  return annually or prn  An After Visit Summary was printed and given to the patient.

## 2014-02-22 LAB — IPS PAP TEST WITH HPV

## 2014-02-23 LAB — URINE CULTURE: Colony Count: 3000

## 2014-02-25 NOTE — Progress Notes (Signed)
Reviewed personally.  M. Suzanne Marcie Shearon, MD.  

## 2014-04-06 ENCOUNTER — Encounter: Payer: Self-pay | Admitting: Neurology

## 2014-04-06 ENCOUNTER — Ambulatory Visit (INDEPENDENT_AMBULATORY_CARE_PROVIDER_SITE_OTHER): Payer: 59 | Admitting: Neurology

## 2014-04-06 VITALS — BP 134/80 | HR 75 | Ht 68.6 in | Wt 231.0 lb

## 2014-04-06 DIAGNOSIS — G4733 Obstructive sleep apnea (adult) (pediatric): Secondary | ICD-10-CM

## 2014-04-06 DIAGNOSIS — G5602 Carpal tunnel syndrome, left upper limb: Secondary | ICD-10-CM

## 2014-04-06 DIAGNOSIS — M5412 Radiculopathy, cervical region: Secondary | ICD-10-CM | POA: Diagnosis not present

## 2014-04-06 NOTE — Patient Instructions (Signed)
1. Call our office for any problems

## 2014-04-06 NOTE — Progress Notes (Signed)
NEUROLOGY FOLLOW UP OFFICE NOTE  [name] [id number]  HISTORY OF PRESENT ILLNESS: I had the pleasure of seeing [name] in follow-up in the neurology clinic on 04/06/2014.  The patient was last seen 4 months ago for left arm pain. EMG/NCV had shown chronic left C5-6 radiculopathy, worse at C5 nerve root level. Mild left median neuropathy consistent with carpal tunnel syndrome. PT had significantly helped. She returns today stating she is feeling so much better. She denies any pain or numbness. She tried the wrist splint on her left wrist but this caused more pain. She has had problems with her right ankle and sees Ortho, she fell the other day as she was turning. She wears a splint on her right leg.   She had initially been seen for episodes of significant drowsiness after eating concentrated sweets. Her MRI brain was normal. Sleep study showed moderate OSA. She is now on CPAP and feels significantly better during the daytime. No further episodes of excessive daytime drowsiness.  HPI: This is a [place] 59 yo RH woman with a history of hypertension and migraines, in her usual state of health until March 2015 when she started having significant drowsiness after eating concentrated sweets. She noticed that 30-60 minutes after eating a honeybun or cinnamon melts, she could not keep her head up, "unreasonably sleepy." A month ago she ate an apple crisp from [employer] and kept falling asleep at stoplights. She eventually made it to her house and recalls opening the garage door, then woke up in her car 2 hours later. She was staggering as she got out of the car, and had a hard time getting the car door open. She reports having her glucose levels checked 2 weeks ago pre- and post-ingestion of an apple crisp, she started having similar significant drowsiness, dizziness, however her glucose levels apparently did not change significantly (89 to 93, per patient). She had an EKG at that time,  which was reported as normal. Vital signs showed pulse of 53, BP lying down 166/86, sitting 180/100, standing 170/108. This was the last episode, she has avoided concentrated sweets since then. She reports having dizzy spells daily lasting for an hour, occurring even without ingestion of concentrated sweets. She was in the grocery store one time and felt dizzy, "like a fog in my head," hot, and feeling as if she would pass out. She denied any palpitations, chest pain, or shortness of breath. At times the dizzy episodes are associated with a slight headache.   Diagnostic Data: TSH and B12 normal. I personally reviewed MRI brain which did not show any acute changes. There is scattered patchy white matter changes bilaterally consistent with chronic microvascular disease.   Sleep study showed moderate obstructive sleep apnea/hypopnea syndrome, AHI 28.6 per hour with supine events. Very loud snoring with oxygen desaturation to a nadir of 88% on room air. She had a successful CPAP titration to 7 CWP, AHI 0 per hour. She wore a small [employer] Eson nasal mask with heated humidifier. Snoring was prevented and mean oxygen saturation held 93.6% on room air with CPAP.   MRI cervical spine last 06/2013 showed multilevel cervical spondylotic changes most notable C5-6 level. At C3-4 broad -based disc osteophyte complex greater to left. Crowding of the ventral nerve roots greater on the left. Moderate to marked left foraminal narrowing. Minimal to mild right foraminal narrowing. C4-5 multifactorial mild spinal stenosis. Crowding of the ventral nerve roots without cord compression. Moderate left-sided and mild  right-sided foraminal narrowing. C5-6 broad-based disc osteophyte complex. Spinal stenosis with crowding of the ventral nerve roots and very mild cord contact/ deformity. Mild to moderate foraminal narrowing bilaterally. C6-7 broad-based disc osteophyte complex slightly greater to left. Spinal stenosis with  crowding of the ventral nerve roots and mild cord contact/ deformity slightly greater on left. Mild to moderate left-sided and mild right-sided foraminal narrowing. C7-T1 minimal right uncinate hypertrophy and foraminal narrowing. Bulge/ spur greatest right paracentral position with slight crowding of the right ventral nerve root.  EMG/NCV of the left UE which did show chronic C5-C6 radiculopathy affecting the left upper extremity, moderate in degree electrically and worse at the C5 nerve root level.She had mild left median neuropathy at or distal to the wrist consistent with the clinical diagnosis of carpal tunnel syndrome.  PAST MEDICAL HISTORY: Past Medical History  Diagnosis Date  . Hypertension   . GERD (gastroesophageal reflux disease)   . Lactose intolerance   . MVP (mitral valve prolapse)   . Anxiety   . Pyelonephritis   . Anemia     as a child  . Sleep apnea   . Spinal stenosis     MEDICATIONS: Current Outpatient Prescriptions on File Prior to Visit  Medication Sig Dispense Refill  . amLODipine (NORVASC) 5 MG tablet Take 5 mg by mouth daily.    Marland Kitchen aspirin 81 MG tablet Take 81 mg by mouth daily.    . Calcium-Magnesium-Vitamin D (CALCIUM 1200+D3) 600-40-500 MG-MG-UNIT TB24 Take 3 tablets by mouth daily.    . cloNIDine (CATAPRES) 0.1 MG tablet Take 0.1 mg by mouth 2 (two) times daily.     Marland Kitchen esomeprazole (NEXIUM) 40 MG capsule Take 40 mg by mouth daily at 12 noon.    . fexofenadine (ALLEGRA) 180 MG tablet Take 180 mg by mouth daily.    Marland Kitchen ibuprofen (ADVIL,MOTRIN) 800 MG tablet Take 800 mg by mouth. Every 12 hours prn    . losartan (COZAAR) 100 MG tablet Take 100 mg by mouth daily.    . metoprolol succinate (TOPROL-XL) 50 MG 24 hr tablet Take 25 mg by mouth daily. Take with or immediately following a meal.    . nitroGLYCERIN (NITROSTAT) 0.4 MG SL tablet Place 1 tablet (0.4 mg total) under the tongue every 5 (five) minutes as needed for chest pain. 30 tablet 12   No current  facility-administered medications on file prior to visit.    ALLERGIES: Allergies  Allergen Reactions  . Crab [Shellfish Allergy] Anaphylaxis  . Ivp Dye [Iodinated Diagnostic Agents] Anaphylaxis  . Nizatidine Anaphylaxis  . Dilaudid [Hydromorphone Hcl] Nausea And Vomiting    Pt can tolerate morphine  . Hydrocodone-Acetaminophen Nausea And Vomiting  . Oxycodone-Acetaminophen Nausea And Vomiting    FAMILY HISTORY: Family History  Problem Relation Age of Onset  . Diabetes Maternal Grandmother   . Ovarian cancer Paternal Grandmother   . Prostate cancer Paternal Grandfather   . Ovarian cancer Paternal Aunt   . Breast cancer Maternal Aunt     post menopause  . Breast cancer Other     3 1st cousins, mastectomy  . Hypertension Mother   . Heart disease Mother   . Heart disease Father   . Ulcers Brother   . Goiter Mother   . Other Brother     polio  . Pyelonephritis Daughter   . Breast cancer Sister     SOCIAL HISTORY: History   Social History  . Marital Status: Married    Spouse Name: N/A  .  Number of Children: 2  . Years of Education: N/A   Occupational History  .  [employer]   Social History Main Topics  . Smoking status: Never Smoker   . Smokeless tobacco: Never Used  . Alcohol Use: No  . Drug Use: No  . Sexual Activity:    Partners: Male   Other Topics Concern  . Not on file   Social History Narrative    REVIEW OF SYSTEMS: Constitutional: No fevers, chills, or sweats, no generalized fatigue, change in appetite Eyes: No visual changes, double vision, eye pain Ear, nose and throat: No hearing loss, ear pain, nasal congestion, sore throat Cardiovascular: No chest pain, palpitations Respiratory:  No shortness of breath at rest or with exertion, wheezes GastrointestinaI: No nausea, vomiting, diarrhea, abdominal pain, fecal incontinence Genitourinary:  No dysuria, urinary retention or frequency Musculoskeletal:  No neck pain, back pain Integumentary: No  rash, pruritus, skin lesions Neurological: as above Psychiatric: No depression, insomnia, anxiety Endocrine: No palpitations, fatigue, diaphoresis, mood swings, change in appetite, change in weight, increased thirst Hematologic/Lymphatic:  No anemia, purpura, petechiae. Allergic/Immunologic: no itchy/runny eyes, nasal congestion, recent allergic reactions, rashes  PHYSICAL EXAM: Filed Vitals:   04/06/14 0938  BP: 134/80  Pulse: 75   General: No acute distress Head:  Normocephalic/atraumatic Neck: supple, no paraspinal tenderness, full range of motion Heart:  Regular rate and rhythm Lungs:  Clear to auscultation bilaterally Back: No paraspinal tenderness Skin/Extremities: No rash, no edema Neurological Exam: alert and oriented to person, place, and time. No aphasia or dysarthria. Fund of knowledge is appropriate.  Recent and remote memory are intact.  Attention and concentration are normal.    Able to name objects and repeat phrases. Cranial nerves: Pupils equal, round, reactive to light.  Fundoscopic exam unremarkable, no papilledema. Extraocular movements intact with no nystagmus. Visual fields full. Facial sensation intact. No facial asymmetry. Tongue, uvula, palate midline.  Motor: Bulk and tone normal, muscle strength 5/5 throughout including APB, with no pronator drift.  Sensation to light touch intact.  No extinction to double simultaneous stimulation.  Deep tendon reflexes 1+ throughout, toes downgoing.  Finger to nose testing intact.  Gait narrow-based and steady, able to tandem walk adequately.  Romberg negative.  IMPRESSION: This is a [place] 59 yo RH woman with a history of hypertension and migraines, who initially presented with episodes of excessive drowsiness after ingestion of concentrated sweets. Her sleep study showed moderate obstructive sleep apnea. It is possible the sleep apnea causes some daytime drowsiness that is worsened after the eats concentrated sweets, and she has  been feeling better since CPAP initiation with no further similar episodes. She presented with new symptoms of left arm pain and numbness. EMG/NCV showed left C-5 radiculopathy and left carpal tunnel syndrome. Her cervical MRI does show degenerative changes, more on the left side. This has significantly improved as well after PT. She is doing well with no neurological symptoms, neurological exam normal. She did not tolerate wrist splint for carpal tunnel, we discussed avoidance of compression at the wrist. She will follow-up on a prn basis and knows to call our office for any changes.   Thank you for allowing me to participate in her care.  Please do not hesitate to call for any questions or concerns.  The duration of this appointment visit was 15 minutes of face-to-face time with the patient.  Greater than 50% of this time was spent in counseling, explanation of diagnosis, planning of further management, and coordination of  care.   Ellouise Newer, M.D.   CC: Dr. Inda Merlin

## 2014-04-10 ENCOUNTER — Other Ambulatory Visit: Payer: Self-pay | Admitting: Obstetrics & Gynecology

## 2014-04-10 DIAGNOSIS — Z1231 Encounter for screening mammogram for malignant neoplasm of breast: Secondary | ICD-10-CM

## 2014-04-13 ENCOUNTER — Ambulatory Visit (HOSPITAL_COMMUNITY): Payer: 59

## 2014-04-17 ENCOUNTER — Ambulatory Visit (HOSPITAL_COMMUNITY)
Admission: RE | Admit: 2014-04-17 | Discharge: 2014-04-17 | Disposition: A | Discharge status: Home / Self Care | Payer: 59 | Source: Ambulatory Visit | Attending: Obstetrics & Gynecology | Admitting: Obstetrics & Gynecology

## 2014-04-17 DIAGNOSIS — Z1231 Encounter for screening mammogram for malignant neoplasm of breast: Secondary | ICD-10-CM | POA: Diagnosis not present

## 2014-05-02 ENCOUNTER — Ambulatory Visit: Payer: 59 | Admitting: Pulmonary Disease

## 2015-01-31 MED FILL — LOSARTAN POTASSIUM 100 MG T: 100 | 90 days supply | Qty: 90 | Fill #0

## 2015-02-27 ENCOUNTER — Encounter: Payer: Self-pay | Admitting: Certified Nurse Midwife

## 2015-02-27 ENCOUNTER — Ambulatory Visit (INDEPENDENT_AMBULATORY_CARE_PROVIDER_SITE_OTHER): Payer: 59 | Admitting: Certified Nurse Midwife

## 2015-02-27 ENCOUNTER — Telehealth: Payer: Self-pay | Admitting: Certified Nurse Midwife

## 2015-02-27 ENCOUNTER — Telehealth: Payer: Self-pay | Admitting: Nurse Practitioner

## 2015-02-27 VITALS — BP 122/72 | HR 76 | Resp 16 | Ht 68.25 in | Wt 234.0 lb

## 2015-02-27 DIAGNOSIS — Z8041 Family history of malignant neoplasm of ovary: Secondary | ICD-10-CM | POA: Diagnosis not present

## 2015-02-27 DIAGNOSIS — Z01419 Encounter for gynecological examination (general) (routine) without abnormal findings: Secondary | ICD-10-CM

## 2015-02-27 DIAGNOSIS — Z803 Family history of malignant neoplasm of breast: Secondary | ICD-10-CM

## 2015-02-27 NOTE — Telephone Encounter (Signed)
Spoke with patient regarding benefit for ultrsound. Patient understood and agreeable. Patient ready to schedule. Patient scheduled 03/07/15 with Dr Sabra Heck. Patient aware of arrival date and time. Patient aware of 72 hours cancellation policy with [id number] fee. No further questions. Ok to close

## 2015-02-27 NOTE — Telephone Encounter (Signed)
Patient is scheduled for an ultrasound on 03/07/15, she wants to know if a CA 125 can be done at that appointment. Routed to triage for reveiw

## 2015-02-27 NOTE — Telephone Encounter (Signed)
Left message advising patient that we will be able to perform a CA 125 here in the office at the time of her PUS. Patient has a family history of breast and ovarian cancer. Advised she will need to notify Dr.Miller that she would like to have this done at her appointment so that she can stop by the lab while she is here. Advised to return call with any further questions.  Routing to provider for final review. Patient agreeable to disposition. Will close encounter.

## 2015-02-27 NOTE — Progress Notes (Signed)
60 y.o. [id number] Married  African American Fe here for annual exam. Postmenopausal no HRT. Denies vaginal bleeding or vaginal dryness. Old problem of urgency to urinate continues with no change, sees Urology for management.  Sees Dr. Inda Merlin for hypertension, headache and cholesterol  Medication management, aex/labs also. All stable medication per patient. Cousin diagnosed with breast cancer recently. Has changed jobs now with [employer] less stress now. No other health issues tdoay.  Patient's last menstrual period was 07/05/2009.          Sexually active: No.  The current method of family planning is post menopausal status.    Exercising: Yes.    walking Smoker:  no  Health Maintenance: Pap:  02-20-14 neg HPV HR neg MMG:  04-17-14 category c density birads 1:neg Colonoscopy:  2009 f/u 17yrs BMD:   2015 f/u 5yrs TDaP:  2012 Shingles: unsure Pneumonia: unsure Hep C and HIV: neg for both Labs: pcp Self breast exam: done monthly   reports that she has never smoked. She has never used smokeless tobacco. She reports that she does not drink alcohol or use illicit drugs.  Past Medical History  Diagnosis Date  . Hypertension   . GERD (gastroesophageal reflux disease)   . Lactose intolerance   . MVP (mitral valve prolapse)   . Anxiety   . Pyelonephritis   . Anemia     as a child  . Sleep apnea   . Spinal stenosis     Past Surgical History  Procedure Laterality Date  . Appendectomy    . Cholecystectomy    . Dilation and curettage of uterus    . Ankle fracture  10/09    bilateral-right was surgically repaired  . Ankle surgery  10/10    screws removed  . Ankle surgery  2/12    removal of all hardware right ankle  . Arm surgery      plate in left arm  . Breast lumpectomy      right breast    Current Outpatient Prescriptions  Medication Sig Dispense Refill  . amLODipine (NORVASC) 5 MG tablet Take 5 mg by mouth daily.    Marland Kitchen aspirin 81 MG tablet Take 81 mg by mouth daily.    .  Calcium-Magnesium-Vitamin D (CALCIUM 1200+D3) 600-40-500 MG-MG-UNIT TB24 Take 3 tablets by mouth daily.    . cloNIDine (CATAPRES) 0.1 MG tablet Take 0.1 mg by mouth 2 (two) times daily.     Marland Kitchen esomeprazole (NEXIUM) 40 MG capsule Take 40 mg by mouth daily at 12 noon.    . fexofenadine (ALLEGRA) 180 MG tablet Take 180 mg by mouth daily.    Marland Kitchen ibuprofen (ADVIL,MOTRIN) 800 MG tablet Take 800 mg by mouth. Every 12 hours prn    . losartan (COZAAR) 100 MG tablet Take 100 mg by mouth daily.    . metoprolol succinate (TOPROL-XL) 25 MG 24 hr tablet   1  . nitroGLYCERIN (NITROSTAT) 0.4 MG SL tablet Place 1 tablet (0.4 mg total) under the tongue every 5 (five) minutes as needed for chest pain. (Patient not taking: Reported on 02/27/2015) 30 tablet 12   No current facility-administered medications for this visit.    Family History  Problem Relation Age of Onset  . Diabetes Maternal Grandmother   . Ovarian cancer Paternal Grandmother   . Prostate cancer Paternal Grandfather   . Ovarian cancer Paternal Aunt   . Breast cancer Maternal Aunt     post menopause  . Breast cancer Other  3 1st cousins, mastectomy  . Hypertension Mother   . Heart disease Mother   . Heart disease Father   . Ulcers Brother   . Goiter Mother   . Other Brother     polio  . Pyelonephritis Daughter   . Breast cancer Sister     ROS:  Pertinent items are noted in HPI.  Otherwise, a comprehensive ROS was negative.  Exam:   BP 122/72 mmHg  Pulse 76  Resp 16  Ht 5' 8.25" (1.734 m)  Wt 234 lb (106.142 kg)  BMI 35.30 kg/m2  LMP 07/05/2009 Height: 5' 8.25" (173.4 cm) Ht Readings from Last 3 Encounters:  02/27/15 5' 8.25" (1.734 m)  04/06/14 5' 8.6" (1.742 m)  02/20/14 5' 8.25" (1.734 m)    General appearance: alert, cooperative and appears stated age Head: Normocephalic, without obvious abnormality, atraumatic Neck: no adenopathy, supple, symmetrical, trachea midline and thyroid normal to inspection and  palpation Lungs: clear to auscultation bilaterally Breasts: normal appearance, no masses or tenderness, No nipple retraction or dimpling, No nipple discharge or bleeding, No axillary or supraclavicular adenopathy Heart: regular rate and rhythm Abdomen: soft, non-tender; no masses,  no organomegaly Extremities: extremities normal, atraumatic, no cyanosis or edema Skin: Skin color, texture, turgor normal. No rashes or lesions Lymph nodes: Cervical, supraclavicular, and axillary nodes normal. No abnormal inguinal nodes palpated Neurologic: Grossly normal   Pelvic: External genitalia:  no lesions              Urethra:  normal appearing urethra with no masses, tenderness or lesions              Bartholin's and Skene's: normal                 Vagina: normal appearing vagina with normal color and discharge, no lesions              Cervix: normal,non tender,no lesions              Pap taken: No. Bimanual Exam:  Uterus:  normal,non tender, no large masses, limited by body habitus              Adnexa: no mass, fullness, tenderness and adnexa not palpated due to body habitus, no large masses               Rectovaginal: Confirms               Anus:  normal sphincter tone, no lesions  Chaperone present: yes  A:  Well Woman with normal exam  Menopausal no HRT  Morbid obesity  Strong family history of breast and ovarian cancer( paternal/maternal)  Breast cancer sister,maternal first cousin and maternal aunt, 2 paternal first cousins,Ovarian cancer Paternal grandmother and paternal aunt  Hypertension and cholesterol management with PCP  P:   Reviewed health and wellness pertinent to exam  Aware of need to evaluate if vaginal bleeding  Discussed weight management to decrease other health concerns, patient is working on as she can.  Discussed limited pelvic exam due to body habitus.  Discussed genetic screening consult and possible screening due to strong family history of breast cancer and ovarian  cancer. Discussed PUS to evaluate due limited pelvic exam and Ca 125. Patient would like to schedule. Patient will be called with insurance infor and scheduled. Patient encouraged to at least have genetic consult, will consider and advise.  Continue follow up with PCP as indicated  Pap smear as above not taken   counseled  on breast self exam, mammography screening, adequate intake of calcium and vitamin D, diet and exercise  return annually or prn  An After Visit Summary was printed and given to the patient.

## 2015-02-27 NOTE — Patient Instructions (Signed)

## 2015-02-28 DIAGNOSIS — I10 Essential (primary) hypertension: Secondary | ICD-10-CM | POA: Diagnosis not present

## 2015-02-28 DIAGNOSIS — K219 Gastro-esophageal reflux disease without esophagitis: Secondary | ICD-10-CM | POA: Diagnosis not present

## 2015-02-28 DIAGNOSIS — Z803 Family history of malignant neoplasm of breast: Secondary | ICD-10-CM | POA: Diagnosis not present

## 2015-02-28 DIAGNOSIS — G4733 Obstructive sleep apnea (adult) (pediatric): Secondary | ICD-10-CM | POA: Diagnosis not present

## 2015-02-28 DIAGNOSIS — E559 Vitamin D deficiency, unspecified: Secondary | ICD-10-CM | POA: Diagnosis not present

## 2015-02-28 DIAGNOSIS — Z8041 Family history of malignant neoplasm of ovary: Secondary | ICD-10-CM | POA: Diagnosis not present

## 2015-02-28 DIAGNOSIS — E785 Hyperlipidemia, unspecified: Secondary | ICD-10-CM | POA: Diagnosis not present

## 2015-02-28 DIAGNOSIS — Z0000 Encounter for general adult medical examination without abnormal findings: Secondary | ICD-10-CM | POA: Diagnosis not present

## 2015-02-28 DIAGNOSIS — J309 Allergic rhinitis, unspecified: Secondary | ICD-10-CM | POA: Diagnosis not present

## 2015-03-01 NOTE — Progress Notes (Signed)
Encounter reviewed Amy Gothard, MD   

## 2015-03-05 DIAGNOSIS — G4733 Obstructive sleep apnea (adult) (pediatric): Secondary | ICD-10-CM | POA: Diagnosis not present

## 2015-03-06 MED FILL — MONTELUKAST SOD 10 MG TAB: 10 | 30 days supply | Qty: 30 | Fill #0

## 2015-03-07 ENCOUNTER — Ambulatory Visit (INDEPENDENT_AMBULATORY_CARE_PROVIDER_SITE_OTHER): Payer: 59 | Admitting: Obstetrics & Gynecology

## 2015-03-07 ENCOUNTER — Ambulatory Visit (INDEPENDENT_AMBULATORY_CARE_PROVIDER_SITE_OTHER): Payer: 59

## 2015-03-07 VITALS — BP 136/86 | HR 66 | Resp 16 | Ht 68.25 in | Wt 236.0 lb

## 2015-03-07 DIAGNOSIS — Z842 Family history of other diseases of the genitourinary system: Secondary | ICD-10-CM

## 2015-03-07 DIAGNOSIS — Z840 Family history of diseases of the skin and subcutaneous tissue: Secondary | ICD-10-CM

## 2015-03-07 DIAGNOSIS — R140 Abdominal distension (gaseous): Secondary | ICD-10-CM

## 2015-03-07 DIAGNOSIS — Z8041 Family history of malignant neoplasm of ovary: Secondary | ICD-10-CM

## 2015-03-07 NOTE — Addendum Note (Signed)
Addended by: Michele Mcalpine on: 03/07/2015 08:18 AM   Modules accepted: Orders

## 2015-03-07 NOTE — Progress Notes (Signed)
60 y.o. [id number] Married Caucasian female here for pelvic ultrasound due to strong family history of ovarian and breast cancer.  Pt and I discussed proceeding with genetic testing at this time as she's had another two relatives with breast cancer. She is in agreement with this plan.  Referral will be placed.  Patient's last menstrual period was 07/05/2009.  Contraception: PMP status  Findings:  UTERUS: 7.8 x 5.7 x 3.4cm EMS:3.45mm ADNEXA: Left ovary:  1.0 x 0.6 x 0.9cm        Right ovary: 1.5 x 1.1 x 1.1cm CUL DE SAC: no free fluid  Discussion:  Normal results discussed.  Pictures reviewed.  Will proceed with ca-125 testing today.  Assessment:  Strong family hx of breast and ovarian cancer  Plan:  Referral to genetic counseling for possible genetic testing Ca-125 pending.  ~15 minutes spent with patient >50% of time was in face to face discussion of above.

## 2015-03-08 LAB — CA 125: CA 125: 9 U/mL (ref <35)

## 2015-03-11 ENCOUNTER — Encounter: Payer: Self-pay | Admitting: Obstetrics & Gynecology

## 2015-03-15 ENCOUNTER — Encounter: Payer: Self-pay | Admitting: Genetic Counselor

## 2015-03-19 MED FILL — FLUTICASONE PROP 50 MCG SPR: 50 | 60 days supply | Qty: 16 | Fill #0

## 2015-04-04 MED FILL — AMLODIPINE BESYLATE 5 MG TA: 5 | 90 days supply | Qty: 90 | Fill #1

## 2015-04-05 MED FILL — cloNIDine HCL 0.1 MG TABS: 0.1 | 90 days supply | Qty: 270 | Fill #0

## 2015-04-09 MED FILL — NexIUM 40 MG CPDR: 40 | 90 days supply | Qty: 90 | Fill #0

## 2015-04-11 ENCOUNTER — Other Ambulatory Visit: Payer: 59

## 2015-04-11 ENCOUNTER — Ambulatory Visit (HOSPITAL_BASED_OUTPATIENT_CLINIC_OR_DEPARTMENT_OTHER): Payer: 59 | Admitting: Genetic Counselor

## 2015-04-11 DIAGNOSIS — Z801 Family history of malignant neoplasm of trachea, bronchus and lung: Secondary | ICD-10-CM

## 2015-04-11 DIAGNOSIS — Z8042 Family history of malignant neoplasm of prostate: Secondary | ICD-10-CM | POA: Diagnosis not present

## 2015-04-11 DIAGNOSIS — Z8049 Family history of malignant neoplasm of other genital organs: Secondary | ICD-10-CM

## 2015-04-11 DIAGNOSIS — Z800 Family history of malignant neoplasm of digestive organs: Secondary | ICD-10-CM

## 2015-04-11 DIAGNOSIS — Z86010 Personal history of colonic polyps: Secondary | ICD-10-CM | POA: Diagnosis not present

## 2015-04-11 DIAGNOSIS — Z8041 Family history of malignant neoplasm of ovary: Secondary | ICD-10-CM

## 2015-04-11 DIAGNOSIS — Z809 Family history of malignant neoplasm, unspecified: Secondary | ICD-10-CM

## 2015-04-11 DIAGNOSIS — Z8371 Family history of colonic polyps: Secondary | ICD-10-CM

## 2015-04-11 DIAGNOSIS — Z315 Encounter for genetic counseling: Secondary | ICD-10-CM

## 2015-04-11 DIAGNOSIS — Z803 Family history of malignant neoplasm of breast: Secondary | ICD-10-CM

## 2015-04-12 ENCOUNTER — Encounter: Payer: Self-pay | Admitting: Genetic Counselor

## 2015-04-12 DIAGNOSIS — Z803 Family history of malignant neoplasm of breast: Secondary | ICD-10-CM | POA: Insufficient documentation

## 2015-04-12 DIAGNOSIS — Z8041 Family history of malignant neoplasm of ovary: Secondary | ICD-10-CM | POA: Insufficient documentation

## 2015-04-12 NOTE — Progress Notes (Signed)
REFERRING PROVIDER: Megan Salon., MD  PRIMARY PROVIDER:  Henrine Screws, MD  PRIMARY REASON FOR VISIT:  1. Family history of breast cancer in female   2. Family history of ovarian cancer   3. Family history of prostate cancer   4. Family history of pancreatic cancer   5. Family history of uterine cancer   6. Family history of liver cancer   7. Family history of colonic polyps   8. Family history of lung cancer   9. Family history of cancer   10. Personal history of colonic polyps      HISTORY OF PRESENT ILLNESS:   [name], a 60 y.o. female, was seen for a [place] cancer genetics consultation at the request of Dr. Sabra Heck due to a family history of ovarian, breast, prostate, and other cancers.  Ms. Steelman presents to clinic today to discuss the possibility of a hereditary predisposition to cancer, genetic testing, and to further clarify her future cancer risks, as well as potential cancer risks for family members.   Ms. Hilgeman is a 60 y.o. female with no personal history of cancer.  She has a history of a benign right breast lumpectomy, and a history of a fundic gland polyp, and 1-2 adenomatous colon polyps.  CANCER HISTORY:   No history exists.     HORMONAL RISK FACTORS:  Menarche was at age 75.  First live birth at age 32.  OCP use for approximately 1 years.  Ovaries intact: yes.  Hysterectomy: no.  Menopausal status: postmenopausal.  HRT use: 0 years. Colonoscopy: yes; most recent in 2010 - found 1-2 adenomatous colon polyps. Mammogram within the last year: yes. Number of breast biopsies: 1. Up to date with pelvic exams:  yes. Any excessive radiation exposure in the past:  no, but does report a history of secondhand smoke exposure as a child  Past Medical History  Diagnosis Date  . Hypertension   . GERD (gastroesophageal reflux disease)   . Lactose intolerance   . MVP (mitral valve prolapse)   . Anxiety   . Pyelonephritis   . Anemia     as a  child  . Sleep apnea   . Spinal stenosis     Past Surgical History  Procedure Laterality Date  . Appendectomy    . Cholecystectomy    . Dilation and curettage of uterus    . Ankle fracture  10/09    bilateral-right was surgically repaired  . Ankle surgery  10/10    screws removed  . Ankle surgery  2/12    removal of all hardware right ankle  . Arm surgery      plate in left arm  . Breast lumpectomy      right breast    Social History   Social History  . Marital Status: Married    Spouse Name: N/A  . Number of Children: 2  . Years of Education: N/A   Occupational History  .  [employer]   Social History Main Topics  . Smoking status: Never Smoker   . Smokeless tobacco: Never Used  . Alcohol Use: No  . Drug Use: No  . Sexual Activity: No     Comment: ablation   Other Topics Concern  . None   Social History Narrative     FAMILY HISTORY:  We obtained a detailed, 4-generation family history.  Significant diagnoses are listed below: Family History  Problem Relation Age of Onset  . Diabetes Maternal Grandmother   .  Stroke Maternal Grandmother   . Kidney disease Maternal Grandmother   . Ovarian cancer Paternal Grandmother     dx. late 30s-early 74s  . Prostate cancer Paternal Grandfather   . Ovarian cancer Paternal Aunt     (x3) paternal aunts dx. ages 51s-late 70s  . Breast cancer Maternal Aunt     dx. 43s  . Hypertension Mother   . Heart disease Mother   . Goiter Mother   . Other Mother     breast lumpectomy in early 54s, was in the hospital for 5 days after  . Heart disease Father   . Prostate cancer Father 51    s/p orchiectomy  . Stroke Father   . Ulcers Brother   . Other Brother     elevated PSA level w/ surveillance  . Colon polyps Brother     less than 10  . Pyelonephritis Daughter   . Breast cancer Sister 8    left breast ca - poorly differentiated carcinoma with squamous metaplasia; triple neg; has since had BL mastectomies  . Colon  polyps Sister     less than 10  . Congestive Heart Failure Sister   . Prostate cancer Paternal Uncle     (x5) paternal uncles dx. prostate cancer in their 72s  . Prostate cancer Maternal Grandfather     d. late 77s with mets to colon  . Other Other 39    NOS benign brain tumor; +fluid  . Prostate cancer Cousin 8    paternal 1st cousin   . Prostate cancer Cousin     paternal 1st cousin, once-removed dx. 77s  . Prostate cancer Cousin     (x2) paternal 1st cousins, once-removed, dx. early 43s  . Lung cancer Brother 93    paternal half-brother; smoker  . Uterine cancer Maternal Aunt     dx. 30s  . Breast cancer Maternal Aunt     dx. bilateral breast cancers - 88s, 36s  . Liver cancer Maternal Aunt     +hepatitis C; d. 67s  . Congestive Heart Failure Maternal Uncle 68  . Stroke Maternal Uncle     d. 72s; (x2 maternal uncles)  . Cancer Cousin 52    sinus cancer dx. 44s (maternal 1st cousin)  . Lung cancer Cousin     maternal 1st cousin dx. 60s/smoker; maternal 1st cousin  . Pancreatic cancer Cousin     maternal 1st cousin dx. late 58s  . Prostate cancer Cousin     maternal 1st cousin dx. 82  . Breast cancer Cousin     (x4) maternal 1st cousins dx. late 26s, 8s    Ms. Raptis has two daughters, ages 44 and 84.  Ms. Heffner oldest daughter has a history of ovarian cysts as a child and a history of endometriosis.  Ms. Kueker has one full sister and brother, and a paternal half-brother.  Ms. Sendejo's sister is currently 52; she was diagnosed with triple negative "poorly differentiated carcinoma with squamous metaplasia" of the left breast at the age of 92.  She was eventually treated with bilateral mastecomies.  She also has a history of TAH-BSO and a history of colon polyps (less than 10).  This sister has three sons and two daughters of her own.  One daughter was diagnosed with a benign brain tumor at the age of 3.  Ms. Zellman's brother is currently 41 and he has an  elevated PSA level that is currently being surveiled.  He also has a history  of colon polyps (less than 10).  Ms. Kolinski's paternal half-brother, a smoker, died of lung cancer at the age of 13.    Ms. Kubin's mother died of an unspecified cause at the age of 10.  She had a history of a breast lumpectomy in her early 79s, and she was in the hospital for 5 days following that surgery.  Ms. Pang's mother had three full sisters, seven full brothers, and one paternal half-brother.  One sister died of breast cancer in her 40s.  She had one son and one daughter; her daughter, a smoker, died of lung cancer at 41.  Another sister had a history of bilateral breast cancers and uterine cancer.  She was diagnosed with uterine cancer in her 54s.  She was diagnosed with cancer of one breast in her 38s and cancer of the other breast in her 74s.  This aunt has no children.  Another aunt had a history of hepatitis C and died of liver cancer in her 32s.  Most of the full brothers lived into their 42s-80s and never had cancer.  Several maternal first cousins (many the children of unaffected uncles) were diagnosed with cancer.  One cousin was diagnosed with sinus cancer in his 57s; one was diagnosed with lung cancer in his 107s; one was diagnosed with pancreatic cancer in his late 17s; one was diagnosed with prostate cancer at the age of 7; 27 female cousins were diagnosed with breast cancer in their late 25s-50s.  Ms. Muradyan's maternal grandmother died of renal failure and a stroke at the age of 58.  Ms. Estabrook's maternal grandfather died of prostate cancer with metastasis to his colon in his late 79s.  Ms. Wandrey had no information for any maternal great aunts/uncles or great grandparents.  Ms. Vandyke's father died of a stroke at the age of 56.  He had a history of prostate cancer with orchiectomy at the age of 75.  He had five full brothers and five full sisters.  Three of the five sisters were diagnosed with  ovarian cancer in their 57s-late 70s.  All five brothers were diagnosed with prostate cancer in their 62s.  One paternal first cousin was diagnosed with prostate cancer at the age of 4.  Three paternal first cousins, once-removed were diagnosed with prostate cancer in their 29s- early 37s.  Ms. Rosenburg's paternal grandmother died of ovarian cancer in her late 14s-40s.  Her paternal grandfather died at the age of 30 and has never had cancer.  She had limited information for her paternal great aunts/uncles and great grandparents.    Ms. Hurtubise is unaware of any family history of previous genetic testing for hereditary cancer, beyond that for her sister.  Her sister had negative BRACAnalysis with 5-site rearrangement analysis through [employer] in 2010.  Patient's maternal ancestors are of Native American - [place] and African American descent, and paternal ancestors are of [place], Native [place], and African American descent. There is no reported Ashkenazi Jewish ancestry. There is no known consanguinity.  GENETIC COUNSELING ASSESSMENT: ANI DEOLIVEIRA is a 60 y.o. female with a family history of breast, ovarian, prostate, and other cancers which is somewhat suggestive of a hereditary cancer syndrome and predisposition to cancer. We, therefore, discussed and recommended the following at today's visit.   DISCUSSION: We reviewed the characteristics, features and inheritance patterns of hereditary cancer syndromes, particularly those caused by mutations within the BRCA1/2, PALB2, and Lynch syndrome genes. We also discussed genetic testing, including the appropriate  family members to test, the process of testing, insurance coverage and turn-around-time for results. We discussed the implications of a negative, positive and/or variant of uncertain significant result.  We discussed proceeding with testing of breast/ovarian cancer-related genes versus a more comprehensive cancer panel.  Ms.  Handler would like to proceed with testing via the 20-gene Breast/Ovarian Cancer Panel.  We recommended Ms. Effinger pursue genetic testing for the 20-gene Breast/Ovarian Cancer Panel through [facility].  The Breast/Ovarian Cancer Panel offered by [facility] Hope Pigeon, MD) includes sequencing and deletion/duplication analysis for the following 19 genes:  ATM, BARD1, BRCA1, BRCA2, BRIP1, CDH1, CHEK2, FANCC, MLH1, MSH2, MSH6, NBN, PALB2, PMS2, PTEN, RAD51C, RAD51D, TP53, and XRCC2.  This panel also includes deletion/duplication analysis (without sequencing) for one gene, EPCAM.  Based on Ms. Scaletta's personal and family history of cancer, she meets medical criteria for genetic testing. Despite that she meets criteria, she may still have an out of pocket cost. We discussed that if her out of pocket cost for testing is over $100, the laboratory will call and confirm whether she wants to proceed with testing.  If the out of pocket cost of testing is less than $100 she will be billed by the genetic testing laboratory.   Based on the patient's personal and family history, a statistical model (IBIS/Tyrer-Cuzick online)  and literature data were used to estimate her risk of developing breast cancer. This estimates her lifetime risk of developing breast cancer to be approximately 17.4%. This estimation does not take into account any genetic testing results.  The patient's lifetime breast cancer risk is a preliminary estimate based on available information using one of several models endorsed by the [facility] (ACS). The ACS recommends consideration of breast MRI screening as an adjunct to mammography for patients at high risk (defined as 20% or greater lifetime risk). A more detailed breast cancer risk assessment can be considered, if clinically indicated.   PLAN: After considering the risks, benefits, and limitations, Ms. Mcneely  provided informed consent to pursue genetic  testing and the blood sample was sent to [facility] for analysis of the 20-gene Breast/Ovarian Cancer Panel. Results should be available within approximately 2-3 weeks' time, at which point they will be disclosed by telephone to Ms. Christenson, as will any additional recommendations warranted by these results. Ms. Wolz will receive a summary of her genetic counseling visit and a copy of her results once available. This information will also be available in Epic. We encouraged Ms. Derocher to remain in contact with cancer genetics annually so that we can continuously update the family history and inform her of any changes in cancer genetics and testing that may be of benefit for her family. Ms. Supan's questions were answered to her satisfaction today. Our contact information was provided should additional questions or concerns arise.  Thank you for the referral and allowing [place] to share in the care of your patient.   Jeanine Luz, MS, [facility] Certified Genetic Counselor [email or web address] Phone: [phone]  The patient was seen for a total of 60 minutes in face-to-face genetic counseling.  This patient was discussed with Drs. Magrinat, Lindi Adie and/or Burr Medico who agrees with the above.    _______________________________________________________________________ For Office Staff:  Number of people involved in session: 1 Was an Intern/ student involved with case: no

## 2015-04-26 DIAGNOSIS — M12571 Traumatic arthropathy, right ankle and foot: Secondary | ICD-10-CM | POA: Diagnosis not present

## 2015-04-26 DIAGNOSIS — M25571 Pain in right ankle and joints of right foot: Secondary | ICD-10-CM | POA: Diagnosis not present

## 2015-04-26 DIAGNOSIS — M25572 Pain in left ankle and joints of left foot: Secondary | ICD-10-CM | POA: Diagnosis not present

## 2015-04-30 ENCOUNTER — Telehealth: Payer: Self-pay | Admitting: Genetic Counselor

## 2015-04-30 NOTE — Telephone Encounter (Signed)
Ms. Smither emailed to let me know that her out-of-pocket cost for genetic testing through [facility] was going to be about $900.  She would like to try testing through [facility] instead.  We discuss the test through [facility] and how it works.  Discussed that cost would be about $260 and the kit will be shipped to her.  Got her email, so that [facility] can send her a link to purchase.  I will also be sending her some consent paperwork through email for her to sign and send back to me.  She is welcome to call with any questions.  I will cancel testing through [facility].

## 2015-05-02 MED FILL — METOPROLOL SUCC ER 25 MG TA: 25 | 90 days supply | Qty: 90 | Fill #1

## 2015-05-02 MED FILL — LOSARTAN POTASSIUM 100 MG T: 100 | 90 days supply | Qty: 90 | Fill #1

## 2015-05-06 ENCOUNTER — Encounter: Payer: Self-pay | Admitting: Genetic Counselor

## 2015-05-24 DIAGNOSIS — M25572 Pain in left ankle and joints of left foot: Secondary | ICD-10-CM | POA: Diagnosis not present

## 2015-05-24 DIAGNOSIS — M25571 Pain in right ankle and joints of right foot: Secondary | ICD-10-CM | POA: Diagnosis not present

## 2015-07-05 MED FILL — NexIUM 40 MG CPDR: 40 | 90 days supply | Qty: 90 | Fill #1

## 2015-07-05 MED FILL — AMLODIPINE BESYLATE 5 MG TA: 5 | 90 days supply | Qty: 90 | Fill #0

## 2015-07-05 MED FILL — IBUPROFEN 800 MG TABLET: 800 | 30 days supply | Qty: 60 | Fill #0

## 2015-08-05 MED FILL — LOSARTAN POTASSIUM 100 MG T: 100 | 90 days supply | Qty: 90 | Fill #0

## 2015-08-05 MED FILL — METOPROLOL SUCC ER 25 MG TA: 25 | 90 days supply | Qty: 90 | Fill #0

## 2015-08-13 DIAGNOSIS — G4733 Obstructive sleep apnea (adult) (pediatric): Secondary | ICD-10-CM | POA: Diagnosis not present

## 2015-08-13 DIAGNOSIS — S82309A Unspecified fracture of lower end of unspecified tibia, initial encounter for closed fracture: Secondary | ICD-10-CM | POA: Diagnosis not present

## 2015-08-26 ENCOUNTER — Other Ambulatory Visit: Payer: Self-pay | Admitting: Internal Medicine

## 2015-08-26 DIAGNOSIS — Z1231 Encounter for screening mammogram for malignant neoplasm of breast: Secondary | ICD-10-CM

## 2015-09-02 ENCOUNTER — Ambulatory Visit
Admission: RE | Admit: 2015-09-02 | Discharge: 2015-09-02 | Disposition: A | Discharge status: Home / Self Care | Payer: 59 | Source: Ambulatory Visit | Attending: Internal Medicine | Admitting: Internal Medicine

## 2015-09-02 DIAGNOSIS — Z1231 Encounter for screening mammogram for malignant neoplasm of breast: Secondary | ICD-10-CM | POA: Diagnosis not present

## 2015-09-04 DIAGNOSIS — Z23 Encounter for immunization: Secondary | ICD-10-CM | POA: Diagnosis not present

## 2015-09-04 DIAGNOSIS — I10 Essential (primary) hypertension: Secondary | ICD-10-CM | POA: Diagnosis not present

## 2015-09-13 DIAGNOSIS — G4733 Obstructive sleep apnea (adult) (pediatric): Secondary | ICD-10-CM | POA: Diagnosis not present

## 2015-09-13 DIAGNOSIS — S82309A Unspecified fracture of lower end of unspecified tibia, initial encounter for closed fracture: Secondary | ICD-10-CM | POA: Diagnosis not present

## 2015-09-27 DIAGNOSIS — M25571 Pain in right ankle and joints of right foot: Secondary | ICD-10-CM | POA: Diagnosis not present

## 2015-09-27 MED FILL — DICLOFENAC SODIUM 1% GEL: 1 | 10 days supply | Qty: 100 | Fill #0

## 2015-10-10 MED FILL — NexIUM 40 MG CPDR: 40 | 90 days supply | Qty: 90 | Fill #2

## 2015-10-10 MED FILL — AMLODIPINE BESYLATE 5 MG TA: 5 | 90 days supply | Qty: 90 | Fill #1

## 2015-10-10 MED FILL — cloNIDine HCL 0.1 MG TABS: 0.1 | 90 days supply | Qty: 270 | Fill #1

## 2015-10-13 DIAGNOSIS — S82309A Unspecified fracture of lower end of unspecified tibia, initial encounter for closed fracture: Secondary | ICD-10-CM | POA: Diagnosis not present

## 2015-10-13 DIAGNOSIS — G4733 Obstructive sleep apnea (adult) (pediatric): Secondary | ICD-10-CM | POA: Diagnosis not present

## 2015-11-06 MED FILL — METOPROLOL SUCC ER 25 MG TA: 25 | 90 days supply | Qty: 90 | Fill #1

## 2015-11-06 MED FILL — LOSARTAN POTASSIUM 100 MG T: 100 | 90 days supply | Qty: 90 | Fill #1

## 2015-11-07 MED FILL — FLUTICASONE PROP 50 MCG SPR: 50 | 60 days supply | Qty: 16 | Fill #1

## 2015-11-11 DIAGNOSIS — G4733 Obstructive sleep apnea (adult) (pediatric): Secondary | ICD-10-CM | POA: Diagnosis not present

## 2015-11-13 DIAGNOSIS — S82309A Unspecified fracture of lower end of unspecified tibia, initial encounter for closed fracture: Secondary | ICD-10-CM | POA: Diagnosis not present

## 2015-11-13 DIAGNOSIS — G4733 Obstructive sleep apnea (adult) (pediatric): Secondary | ICD-10-CM | POA: Diagnosis not present

## 2015-11-21 DIAGNOSIS — H1850 Unspecified hereditary corneal dystrophies: Secondary | ICD-10-CM | POA: Diagnosis not present

## 2015-11-21 DIAGNOSIS — H5203 Hypermetropia, bilateral: Secondary | ICD-10-CM | POA: Diagnosis not present

## 2015-11-21 DIAGNOSIS — H40052 Ocular hypertension, left eye: Secondary | ICD-10-CM | POA: Diagnosis not present

## 2015-11-21 DIAGNOSIS — H52223 Regular astigmatism, bilateral: Secondary | ICD-10-CM | POA: Diagnosis not present

## 2015-12-03 MED FILL — ALPRAZolam 0.25 MG TABS: 0.25 | 10 days supply | Qty: 10 | Fill #0

## 2015-12-11 DIAGNOSIS — J209 Acute bronchitis, unspecified: Secondary | ICD-10-CM | POA: Diagnosis not present

## 2015-12-11 DIAGNOSIS — R05 Cough: Secondary | ICD-10-CM | POA: Diagnosis not present

## 2015-12-11 MED FILL — levoFLOXacin 500 MG TABS: 500 | 3 days supply | Qty: 3 | Fill #0

## 2015-12-13 DIAGNOSIS — G4733 Obstructive sleep apnea (adult) (pediatric): Secondary | ICD-10-CM | POA: Diagnosis not present

## 2015-12-13 DIAGNOSIS — S82309A Unspecified fracture of lower end of unspecified tibia, initial encounter for closed fracture: Secondary | ICD-10-CM | POA: Diagnosis not present

## 2015-12-13 MED FILL — levoFLOXacin 500 MG TABS: 500 | 3 days supply | Qty: 3 | Fill #1

## 2016-01-10 MED FILL — AMLODIPINE BESYLATE 5 MG TA: 5 | 90 days supply | Qty: 90 | Fill #2

## 2016-01-13 DIAGNOSIS — G4733 Obstructive sleep apnea (adult) (pediatric): Secondary | ICD-10-CM | POA: Diagnosis not present

## 2016-01-13 DIAGNOSIS — S82309A Unspecified fracture of lower end of unspecified tibia, initial encounter for closed fracture: Secondary | ICD-10-CM | POA: Diagnosis not present

## 2016-01-14 ENCOUNTER — Emergency Department (HOSPITAL_COMMUNITY)
Admission: EM | Admit: 2016-01-14 | Discharge: 2016-01-14 | Disposition: A | Discharge status: Home / Self Care | Payer: 59 | Attending: Emergency Medicine | Admitting: Emergency Medicine

## 2016-01-14 ENCOUNTER — Emergency Department (HOSPITAL_COMMUNITY): Payer: 59

## 2016-01-14 ENCOUNTER — Encounter (HOSPITAL_COMMUNITY): Payer: Self-pay | Admitting: Emergency Medicine

## 2016-01-14 DIAGNOSIS — N281 Cyst of kidney, acquired: Secondary | ICD-10-CM | POA: Diagnosis not present

## 2016-01-14 DIAGNOSIS — R109 Unspecified abdominal pain: Secondary | ICD-10-CM | POA: Diagnosis not present

## 2016-01-14 DIAGNOSIS — R1031 Right lower quadrant pain: Secondary | ICD-10-CM | POA: Diagnosis not present

## 2016-01-14 DIAGNOSIS — Z7982 Long term (current) use of aspirin: Secondary | ICD-10-CM | POA: Insufficient documentation

## 2016-01-14 DIAGNOSIS — I10 Essential (primary) hypertension: Secondary | ICD-10-CM | POA: Diagnosis not present

## 2016-01-14 HISTORY — DX: Disorder of kidney and ureter, unspecified: N28.9

## 2016-01-14 LAB — URINALYSIS, ROUTINE W REFLEX MICROSCOPIC
BILIRUBIN URINE: NEGATIVE
Glucose, UA: NEGATIVE mg/dL
Hgb urine dipstick: NEGATIVE
Ketones, ur: NEGATIVE mg/dL
LEUKOCYTES UA: NEGATIVE
NITRITE: NEGATIVE
Protein, ur: NEGATIVE mg/dL
SPECIFIC GRAVITY, URINE: 1.013 (ref 1.005–1.030)
pH: 6 (ref 5.0–8.0)

## 2016-01-14 MED ORDER — ONDANSETRON 4 MG PO TBDP: 4.0000 mg | ORAL_TABLET | Freq: Three times a day (TID) | ORAL | 0 refills | Status: DC | PRN

## 2016-01-14 MED ORDER — MORPHINE SULFATE 15 MG PO TABS: 15.0000 mg | ORAL_TABLET | Freq: Four times a day (QID) | ORAL | 0 refills | Status: DC | PRN

## 2016-01-14 MED ORDER — MORPHINE SULFATE (PF) 4 MG/ML IV SOLN
4.0000 mg | Freq: Once | INTRAVENOUS | Status: AC
  Administered 2016-01-14: 4 mg via INTRAMUSCULAR
  Filled 2016-01-14: qty 1

## 2016-01-14 MED ORDER — KETOROLAC TROMETHAMINE 30 MG/ML IJ SOLN
30.0000 mg | Freq: Once | INTRAMUSCULAR | Status: AC
  Administered 2016-01-14: 30 mg via INTRAMUSCULAR
  Filled 2016-01-14: qty 1

## 2016-01-14 MED FILL — ONDANSETRON ODT 4 MG TABLET: 4 | 7 days supply | Qty: 20 | Fill #0

## 2016-01-14 MED FILL — MORPHINE SULFATE IR 15 MG T: 15 | 5 days supply | Qty: 20 | Fill #0

## 2016-01-14 NOTE — Discharge Instructions (Signed)
Tonight she was evaluated for flank pain is been going on for the past 5 days urine shows no sign of infection the CT scan shows that you have multiple large cysts in both kidneys left greater than right been given a referral to urologist please call and make an appointment for further evaluation

## 2016-01-14 NOTE — ED Triage Notes (Signed)
Pt from home with complaints of right sided flank pain. Pt states this began on Thurs. Pt has hx of kidney stones, last of which was during the summer. Pt denies hematuria and denies pain with urination. Pt states she has difficulty urinating at this time. Pt states her pain is 10/10 and stretches from her back to her groin.

## 2016-01-14 NOTE — ED Provider Notes (Signed)
[facility] DEPT Provider Note   CSN: [id number] Arrival date & time: 01/14/16  0150     History   Chief Complaint Chief Complaint  Patient presents with  . Flank Pain    HPI [name] is a 61 y.o. female.  This is a 61 year old female with a history of kidney stones who presents with 5 days of right flank pain that waxed and waned in intensity but gotten worse over the last 6 hours. She reports no hematuria she's postmenopausal. She's been using a heating pad, Lidoderm patch and Tylenol for discomfort She reports that she has been able to pass her stones      Past Medical History:  Diagnosis Date  . Anemia    as a child  . Anxiety   . GERD (gastroesophageal reflux disease)   . Hypertension   . Lactose intolerance   . MVP (mitral valve prolapse)   . Pyelonephritis   . Renal disorder   . Sleep apnea   . Spinal stenosis     Patient Active Problem List   Diagnosis Date Noted  . Family history of breast cancer in female 04/12/2015  . Family history of ovarian cancer 04/12/2015  . Cervical radiculopathy 04/06/2014  . Carpal tunnel syndrome of left wrist 04/06/2014  . Left arm pain 10/27/2013  . Cervical disc disorder with radiculopathy of cervical region 10/27/2013  . Ischemic chest pain ([place]) 10/17/2013  . Chest pain 10/17/2013  . Obstructive sleep apnea 06/28/2013  . Dizziness 06/02/2013  . Headache(784.0) 06/02/2013  . Drowsiness 06/02/2013  . Neck pain 06/02/2013  . ABNORMALITY OF GAIT 03/15/2009  . ANKLE PAIN, BILATERAL 03/14/2009  . Pain in limb 03/14/2009    Past Surgical History:  Procedure Laterality Date  . ankle fracture  10/09   bilateral-right was surgically repaired  . ANKLE SURGERY  10/10   screws removed  . ANKLE SURGERY  2/12   removal of all hardware right ankle  . APPENDECTOMY    . arm surgery     plate in left arm  . BREAST LUMPECTOMY     right breast  . CHOLECYSTECTOMY    . DILATION AND CURETTAGE OF UTERUS       OB History    Gravida Para Term Preterm AB Living   4 2     2 2    SAB TAB Ectopic Multiple Live Births   2              Obstetric Comments   1 stepchild       Home Medications    Prior to Admission medications   Medication Sig Start Date End Date Taking? Authorizing Provider  amLODipine (NORVASC) 5 MG tablet Take 5 mg by mouth daily.   Yes Historical Provider, MD  aspirin 81 MG tablet Take 81 mg by mouth daily.   Yes Historical Provider, MD  Calcium-Magnesium-Vitamin D (CALCIUM 1200+D3) 600-40-500 MG-MG-UNIT TB24 Take 3 tablets by mouth daily.   Yes Historical Provider, MD  cloNIDine (CATAPRES) 0.1 MG tablet Take 0.1 mg by mouth 2 (two) times daily.    Yes Historical Provider, MD  esomeprazole (NEXIUM) 40 MG capsule Take 40 mg by mouth daily.    Yes Historical Provider, MD  ibuprofen (ADVIL,MOTRIN) 800 MG tablet Take 800 mg by mouth every 12 (twelve) hours as needed for moderate pain. Every 12 hours prn    Yes Historical Provider, MD  Liniments (SALONPAS EX) Apply 1 each topically daily as needed (pain).   Yes  Historical Provider, MD  losartan (COZAAR) 100 MG tablet Take 100 mg by mouth daily.   Yes Historical Provider, MD  metoprolol succinate (TOPROL-XL) 25 MG 24 hr tablet Take 25 mg by mouth daily.  12/25/14  Yes Historical Provider, MD  nitroGLYCERIN (NITROSTAT) 0.4 MG SL tablet Place 1 tablet (0.4 mg total) under the tongue every 5 (five) minutes as needed for chest pain. 10/17/13  Yes Reyne Dumas, MD    Family History Family History  Problem Relation Age of Onset  . Diabetes Maternal Grandmother   . Stroke Maternal Grandmother   . Kidney disease Maternal Grandmother   . Ovarian cancer Paternal Grandmother     dx. late 30s-early 48s  . Prostate cancer Paternal Grandfather   . Ovarian cancer Paternal Aunt     (x3) paternal aunts dx. ages 11s-late 70s  . Breast cancer Maternal Aunt     dx. 43s  . Hypertension Mother   . Heart disease Mother   . Goiter Mother   .  Other Mother     breast lumpectomy in early 27s, was in the hospital for 5 days after  . Heart disease Father   . Prostate cancer Father 27    s/p orchiectomy  . Stroke Father   . Ulcers Brother   . Other Brother     elevated PSA level w/ surveillance  . Colon polyps Brother     less than 10  . Pyelonephritis Daughter   . Breast cancer Sister 35    left breast ca - poorly differentiated carcinoma with squamous metaplasia; triple neg; has since had BL mastectomies  . Colon polyps Sister     less than 10  . Congestive Heart Failure Sister   . Prostate cancer Paternal Uncle     (x5) paternal uncles dx. prostate cancer in their 110s  . Prostate cancer Maternal Grandfather     d. late 67s with mets to colon  . Other Other 39    NOS benign brain tumor; +fluid  . Prostate cancer Cousin 7    paternal 1st cousin   . Prostate cancer Cousin     paternal 1st cousin, once-removed dx. 64s  . Prostate cancer Cousin     (x2) paternal 1st cousins, once-removed, dx. early 68s  . Lung cancer Brother 58    paternal half-brother; smoker  . Uterine cancer Maternal Aunt     dx. 30s  . Breast cancer Maternal Aunt     dx. bilateral breast cancers - 41s, 36s  . Liver cancer Maternal Aunt     +hepatitis C; d. 67s  . Congestive Heart Failure Maternal Uncle 68  . Stroke Maternal Uncle     d. 85s; (x2 maternal uncles)  . Cancer Cousin 52    sinus cancer dx. 57s (maternal 1st cousin)  . Lung cancer Cousin     maternal 1st cousin dx. 60s/smoker; maternal 1st cousin  . Pancreatic cancer Cousin     maternal 1st cousin dx. late 94s  . Prostate cancer Cousin     maternal 1st cousin dx. 35  . Breast cancer Cousin     (x4) maternal 1st cousins dx. late 39s, 42s    Social History Social History  Substance Use Topics  . Smoking status: Never Smoker  . Smokeless tobacco: Never Used  . Alcohol use No     Allergies   Crab [shellfish allergy]; Ivp dye [iodinated diagnostic agents]; Nizatidine;  Dilaudid [hydromorphone hcl]; Hydrocodone-acetaminophen; and Oxycodone-acetaminophen   Review of  Systems Review of Systems  Constitutional: Negative for fever.  Respiratory: Negative for shortness of breath.   Cardiovascular: Negative for chest pain.  Genitourinary: Positive for flank pain. Negative for dysuria, frequency and hematuria.  Neurological: Negative for headaches.  Psychiatric/Behavioral: Negative for agitation.  All other systems reviewed and are negative.    Physical Exam Updated Vital Signs BP 179/90 (BP Location: Left Arm)   Pulse 73   Temp 98 F (36.7 C) (Oral)   Resp 16   Ht 5\' 8"  (1.727 m)   Wt 106.6 kg   LMP 07/05/2009   SpO2 100%   BMI 35.73 kg/m   Physical Exam  Constitutional: She appears well-developed and well-nourished.  HENT:  Head: Normocephalic.  Eyes: Pupils are equal, round, and reactive to light.  Neck: Normal range of motion.  Cardiovascular: Normal rate.   Pulmonary/Chest: Effort normal.  Abdominal: Soft. There is no tenderness.  Musculoskeletal: Normal range of motion.  Neurological: She is alert.  Skin: Skin is warm and dry.  Psychiatric: She has a normal mood and affect.  Nursing note and vitals reviewed.    ED Treatments / Results  Labs (all labs ordered are listed, but only abnormal results are displayed) Labs Reviewed  URINALYSIS, ROUTINE W REFLEX MICROSCOPIC    EKG  EKG Interpretation None       Radiology No results found.  Procedures Procedures (including critical care time)  Medications Ordered in ED Medications  ketorolac (TORADOL) 30 MG/ML injection 30 mg (not administered)     Initial Impression / Assessment and Plan / ED Course  I have reviewed the triage vital signs and the nursing notes.  Pertinent labs & imaging results that were available during my care of the patient were reviewed by me and considered in my medical decision making (see chart for details).  Clinical Course    Patient  specifically asking that she not be given narcotics that she has had reactions to them all  we discussed this and she agrees to Toradol and a renal study  Patient's renal study shows that she does not have any stones that she has multiple renal cysts left greater than right this was reported as the patient states that she was aware of this and never followed up with urology Visit was given IM morphine and ODT Zofran and she was able to tolerate this she'll be discharged home with morphine tablets 15 mg 1 every 6 hours as needed for pain as well as ODT Zofran with instructions to follow up with urology next available appointment Final Clinical Impressions(s) / ED Diagnoses   Final diagnoses:  Flank pain, acute    New Prescriptions New Prescriptions   No medications on file     Junius Creamer, NP 01/14/16 Riva, NP 01/14/16 [id number]    Merryl Hacker, MD 01/14/16 [phone]

## 2016-01-15 DIAGNOSIS — N281 Cyst of kidney, acquired: Secondary | ICD-10-CM | POA: Diagnosis not present

## 2016-01-15 DIAGNOSIS — R350 Frequency of micturition: Secondary | ICD-10-CM | POA: Diagnosis not present

## 2016-01-15 DIAGNOSIS — R1084 Generalized abdominal pain: Secondary | ICD-10-CM | POA: Diagnosis not present

## 2016-01-15 DIAGNOSIS — R8271 Bacteriuria: Secondary | ICD-10-CM | POA: Diagnosis not present

## 2016-01-17 ENCOUNTER — Other Ambulatory Visit: Payer: Self-pay | Admitting: Internal Medicine

## 2016-01-17 ENCOUNTER — Ambulatory Visit
Admission: RE | Admit: 2016-01-17 | Discharge: 2016-01-17 | Disposition: A | Discharge status: Home / Self Care | Payer: 59 | Source: Ambulatory Visit | Attending: Internal Medicine | Admitting: Internal Medicine

## 2016-01-17 DIAGNOSIS — R109 Unspecified abdominal pain: Secondary | ICD-10-CM | POA: Diagnosis not present

## 2016-01-17 DIAGNOSIS — M5416 Radiculopathy, lumbar region: Secondary | ICD-10-CM

## 2016-01-17 DIAGNOSIS — M25551 Pain in right hip: Secondary | ICD-10-CM

## 2016-01-17 DIAGNOSIS — Q6100 Congenital renal cyst, unspecified: Secondary | ICD-10-CM | POA: Diagnosis not present

## 2016-01-17 DIAGNOSIS — N281 Cyst of kidney, acquired: Secondary | ICD-10-CM | POA: Diagnosis not present

## 2016-01-17 DIAGNOSIS — M5136 Other intervertebral disc degeneration, lumbar region: Secondary | ICD-10-CM | POA: Diagnosis not present

## 2016-01-17 MED FILL — predniSONE 10 MG TABS: 10 | 7 days supply | Qty: 30 | Fill #0

## 2016-02-13 DIAGNOSIS — S82309A Unspecified fracture of lower end of unspecified tibia, initial encounter for closed fracture: Secondary | ICD-10-CM | POA: Diagnosis not present

## 2016-02-13 DIAGNOSIS — G4733 Obstructive sleep apnea (adult) (pediatric): Secondary | ICD-10-CM | POA: Diagnosis not present

## 2016-02-18 DIAGNOSIS — M5416 Radiculopathy, lumbar region: Secondary | ICD-10-CM | POA: Diagnosis not present

## 2016-02-18 DIAGNOSIS — K219 Gastro-esophageal reflux disease without esophagitis: Secondary | ICD-10-CM | POA: Diagnosis not present

## 2016-02-18 DIAGNOSIS — M25551 Pain in right hip: Secondary | ICD-10-CM | POA: Diagnosis not present

## 2016-02-18 MED FILL — FAMOTIDINE 20 MG TABLET: 20 | 90 days supply | Qty: 90 | Fill #0

## 2016-02-18 MED FILL — ESOMEPRAZOLE MAG DR 40 MG C: 40 | 90 days supply | Qty: 90 | Fill #0

## 2016-02-21 MED FILL — METOPROLOL SUCC ER 25 MG TA: 25 | 90 days supply | Qty: 90 | Fill #2

## 2016-02-21 MED FILL — LOSARTAN POTASSIUM 100 MG T: 100 | 90 days supply | Qty: 90 | Fill #2

## 2016-02-27 DIAGNOSIS — G4733 Obstructive sleep apnea (adult) (pediatric): Secondary | ICD-10-CM | POA: Diagnosis not present

## 2016-03-03 ENCOUNTER — Encounter: Payer: Self-pay | Admitting: Certified Nurse Midwife

## 2016-03-03 ENCOUNTER — Ambulatory Visit (INDEPENDENT_AMBULATORY_CARE_PROVIDER_SITE_OTHER): Payer: 59 | Admitting: Certified Nurse Midwife

## 2016-03-03 VITALS — BP 120/82 | HR 70 | Resp 16 | Ht 67.75 in | Wt 232.0 lb

## 2016-03-03 DIAGNOSIS — Z124 Encounter for screening for malignant neoplasm of cervix: Secondary | ICD-10-CM | POA: Diagnosis not present

## 2016-03-03 DIAGNOSIS — N393 Stress incontinence (female) (male): Secondary | ICD-10-CM

## 2016-03-03 DIAGNOSIS — Z01419 Encounter for gynecological examination (general) (routine) without abnormal findings: Secondary | ICD-10-CM | POA: Diagnosis not present

## 2016-03-03 NOTE — Progress Notes (Signed)
Encounter reviewed Jill Jertson, MD   

## 2016-03-03 NOTE — Progress Notes (Signed)
61 y.o. [id number] Married  African American Fe here for annual exam. Menopausal no HRT, denies vaginal dryness or vaginal bleeding. Recent diagnosis of back issue and also had a kidney CT all normal with kidney. Sees PCP for hypertension, cholesterol and aex, labs. Still has sporadic urinary leakage. Considering Geneve  Treatment. Sees PCP for GERD, Hypertension and cholesterol management/labs and aex. Continues with stress incontinence at times . Denies pain with urination or frequency or urgency. Trial of medication for urgency and had a reaction. " Tired of this ". No other health issues today.  Patient's last menstrual period was 07/05/2009.          Sexually active: Yes.    The current method of family planning is post menopausal status.    Exercising: Yes.    walking Smoker:  no  Health Maintenance: Pap:  02-20-14 neg HPV HR neg MMG: 09-02-15 category b density birads 1:neg Colonoscopy:  2009 f/u 29yrs BMD:   2015 f/u 58yrs TDaP:  2012 Shingles: had done Pneumonia: had done Hep C and HIV: neg for both Labs: pcp Self breast exam: done monthly   reports that she has never smoked. She has never used smokeless tobacco. She reports that she does not drink alcohol or use drugs.  Past Medical History:  Diagnosis Date  . Anemia    as a child  . Anxiety   . GERD (gastroesophageal reflux disease)   . Hypertension   . Lactose intolerance   . MVP (mitral valve prolapse)   . Pyelonephritis   . Renal disorder   . Sleep apnea   . Spinal stenosis     Past Surgical History:  Procedure Laterality Date  . ankle fracture  10/09   bilateral-right was surgically repaired  . ANKLE SURGERY  10/10   screws removed  . ANKLE SURGERY  2/12   removal of all hardware right ankle  . APPENDECTOMY    . arm surgery     plate in left arm  . BREAST LUMPECTOMY     right breast  . CHOLECYSTECTOMY    . DILATION AND CURETTAGE OF UTERUS      Current Outpatient Prescriptions  Medication Sig Dispense  Refill  . amLODipine (NORVASC) 5 MG tablet Take 5 mg by mouth daily.    Marland Kitchen aspirin 81 MG tablet Take 81 mg by mouth daily.    . Calcium-Magnesium-Vitamin D (CALCIUM 1200+D3) 600-40-500 MG-MG-UNIT TB24 Take 3 tablets by mouth daily.    . cloNIDine (CATAPRES) 0.1 MG tablet Take 0.1 mg by mouth 2 (two) times daily.     Marland Kitchen esomeprazole (NEXIUM) 40 MG capsule Take 40 mg by mouth daily.     Marland Kitchen ibuprofen (ADVIL,MOTRIN) 800 MG tablet Take 800 mg by mouth every 12 (twelve) hours as needed for moderate pain. Every 12 hours prn     . Liniments (SALONPAS EX) Apply 1 each topically daily as needed (pain).    Marland Kitchen losartan (COZAAR) 100 MG tablet Take 100 mg by mouth daily.    . metoprolol succinate (TOPROL-XL) 25 MG 24 hr tablet Take 25 mg by mouth daily.   1  . nitroGLYCERIN (NITROSTAT) 0.4 MG SL tablet Place 1 tablet (0.4 mg total) under the tongue every 5 (five) minutes as needed for chest pain. 30 tablet 12  . famotidine (PEPCID) 20 MG tablet   3   No current facility-administered medications for this visit.     Family History  Problem Relation Age of Onset  . Diabetes Maternal  Grandmother   . Stroke Maternal Grandmother   . Kidney disease Maternal Grandmother   . Ovarian cancer Paternal Grandmother     dx. late 30s-early 7s  . Prostate cancer Paternal Grandfather   . Ovarian cancer Paternal Aunt     (x3) paternal aunts dx. ages 16s-late 70s  . Breast cancer Maternal Aunt     dx. 24s  . Hypertension Mother   . Heart disease Mother   . Goiter Mother   . Other Mother     breast lumpectomy in early 57s, was in the hospital for 5 days after  . Heart disease Father   . Prostate cancer Father 49    s/p orchiectomy  . Stroke Father   . Ulcers Brother   . Other Brother     elevated PSA level w/ surveillance  . Colon polyps Brother     less than 10  . Pyelonephritis Daughter   . Breast cancer Sister 16    left breast ca - poorly differentiated carcinoma with squamous metaplasia; triple neg; has  since had BL mastectomies  . Colon polyps Sister     less than 10  . Congestive Heart Failure Sister   . Prostate cancer Paternal Uncle     (x5) paternal uncles dx. prostate cancer in their 20s  . Prostate cancer Maternal Grandfather     d. late 11s with mets to colon  . Other Other 39    NOS benign brain tumor; +fluid  . Prostate cancer Cousin 86    paternal 1st cousin   . Prostate cancer Cousin     paternal 1st cousin, once-removed dx. 49s  . Prostate cancer Cousin     (x2) paternal 1st cousins, once-removed, dx. early 3s  . Lung cancer Brother 24    paternal half-brother; smoker  . Uterine cancer Maternal Aunt     dx. 30s  . Breast cancer Maternal Aunt     dx. bilateral breast cancers - 83s, 20s  . Liver cancer Maternal Aunt     +hepatitis C; d. 29s  . Congestive Heart Failure Maternal Uncle 68  . Stroke Maternal Uncle     d. 21s; (x2 maternal uncles)  . Cancer Cousin 52    sinus cancer dx. 71s (maternal 1st cousin)  . Lung cancer Cousin     maternal 1st cousin dx. 60s/smoker; maternal 1st cousin  . Pancreatic cancer Cousin     maternal 1st cousin dx. late 68s  . Prostate cancer Cousin     maternal 1st cousin dx. 47  . Breast cancer Cousin     (x4) maternal 1st cousins dx. late 12s, 72s    ROS:  Pertinent items are noted in HPI.  Otherwise, a comprehensive ROS was negative.  Exam:   BP 120/82   Pulse 70   Resp 16   Ht 5' 7.75" (1.721 m)   Wt 232 lb (105.2 kg)   LMP 07/05/2009   BMI 35.54 kg/m  Height: 5' 7.75" (172.1 cm) Ht Readings from Last 3 Encounters:  03/03/16 5' 7.75" (1.721 m)  01/14/16 5\' 8"  (1.727 m)  03/07/15 5' 8.25" (1.734 m)    General appearance: alert, cooperative and appears stated age Head: Normocephalic, without obvious abnormality, atraumatic Neck: no adenopathy, supple, symmetrical, trachea midline and thyroid normal to inspection and palpation Lungs: clear to auscultation bilaterally Breasts: normal appearance, no masses or  tenderness, No nipple retraction or dimpling, No nipple discharge or bleeding, No axillary or supraclavicular adenopathy Heart: regular  rate and rhythm Abdomen: soft, non-tender; no masses,  no organomegaly Extremities: extremities normal, atraumatic, no cyanosis or edema Skin: Skin color, texture, turgor normal. No rashes or lesions Lymph nodes: Cervical, supraclavicular, and axillary nodes normal. No abnormal inguinal nodes palpated Neurologic: Grossly normal   Pelvic: External genitalia:  no lesions              Urethra:  normal appearing urethra with no masses, tenderness or lesions              Bartholin's and Skene's: normal                 Vagina: normal appearing vagina with normal color and discharge, no lesions, poor support, no leaking with vigorous cough. Very mild cystocele              Cervix: no bleeding following Pap, no cervical motion tenderness and no lesions              Pap taken: Yes.   Bimanual Exam:  Uterus:  normal size, contour, position, consistency, mobility, non-tender              Adnexa: normal adnexa and no mass, fullness, tenderness               Rectovaginal: Confirms               Anus:  normal sphincter tone, no lesions  Chaperone present: yes  A:  Well Woman with normal exam  Menopausal no HRT  Sporadic stress incontinence, no excessive caffeine  Strong family history of second degree relatives with breast cancer. Patient aware of genetic screening and importance of SBE and mammogram.  Hypertension/cholesterol, GERD with PCP management  P:   Reviewed health and wellness pertinent to exam  Aware of need to evaluate if vaginal bleeding  Discussed poor support vaginally/mild cystocele and etiology.  Would recommend pelvic evaluation with PT, patient would like to move forward with this. Referral will be made and she will be called with appointment information. Avoiding holding urine for long periods of time. Increase water intake.  Continue PCP follow  up as indicated  Pap smear as above with HPV reflex   counseled on breast self exam, mammography screening, menopause, adequate intake of calcium and vitamin D, diet and exercise  return annually or prn  An After Visit Summary was printed and given to the patient.

## 2016-03-03 NOTE — Patient Instructions (Signed)

## 2016-03-04 LAB — IPS PAP TEST WITH REFLEX TO HPV

## 2016-03-25 DIAGNOSIS — N3946 Mixed incontinence: Secondary | ICD-10-CM | POA: Diagnosis not present

## 2016-03-25 DIAGNOSIS — M6281 Muscle weakness (generalized): Secondary | ICD-10-CM | POA: Diagnosis not present

## 2016-03-25 DIAGNOSIS — R350 Frequency of micturition: Secondary | ICD-10-CM | POA: Diagnosis not present

## 2016-04-07 DIAGNOSIS — R350 Frequency of micturition: Secondary | ICD-10-CM | POA: Diagnosis not present

## 2016-04-07 DIAGNOSIS — M6281 Muscle weakness (generalized): Secondary | ICD-10-CM | POA: Diagnosis not present

## 2016-04-07 DIAGNOSIS — N3946 Mixed incontinence: Secondary | ICD-10-CM | POA: Diagnosis not present

## 2016-04-07 DIAGNOSIS — R3915 Urgency of urination: Secondary | ICD-10-CM | POA: Diagnosis not present

## 2016-04-07 DIAGNOSIS — M62838 Other muscle spasm: Secondary | ICD-10-CM | POA: Diagnosis not present

## 2016-04-16 MED FILL — AMLODIPINE BESYLATE 5 MG TA: 5 | 90 days supply | Qty: 90 | Fill #3

## 2016-04-20 DIAGNOSIS — M62838 Other muscle spasm: Secondary | ICD-10-CM | POA: Diagnosis not present

## 2016-04-20 DIAGNOSIS — N3946 Mixed incontinence: Secondary | ICD-10-CM | POA: Diagnosis not present

## 2016-04-20 DIAGNOSIS — R3915 Urgency of urination: Secondary | ICD-10-CM | POA: Diagnosis not present

## 2016-04-20 DIAGNOSIS — R3912 Poor urinary stream: Secondary | ICD-10-CM | POA: Diagnosis not present

## 2016-04-20 DIAGNOSIS — M6281 Muscle weakness (generalized): Secondary | ICD-10-CM | POA: Diagnosis not present

## 2016-04-21 MED FILL — cloNIDine HCL 0.1 MG TABS: 0.1 | 90 days supply | Qty: 270 | Fill #0

## 2016-04-22 NOTE — Telephone Encounter (Signed)
Erroneous encounter

## 2016-04-27 DIAGNOSIS — R350 Frequency of micturition: Secondary | ICD-10-CM | POA: Diagnosis not present

## 2016-04-27 DIAGNOSIS — M6281 Muscle weakness (generalized): Secondary | ICD-10-CM | POA: Diagnosis not present

## 2016-04-27 DIAGNOSIS — M62838 Other muscle spasm: Secondary | ICD-10-CM | POA: Diagnosis not present

## 2016-04-27 DIAGNOSIS — N3946 Mixed incontinence: Secondary | ICD-10-CM | POA: Diagnosis not present

## 2016-04-27 DIAGNOSIS — R3915 Urgency of urination: Secondary | ICD-10-CM | POA: Diagnosis not present

## 2016-05-03 IMAGING — CR DG CHEST 2V
2 series · 2 of 2 positions shown · non-contrast
Comparison: 02/09/2013

CLINICAL DATA: Chest pain.  Initial encounter.

EXAM:
CHEST  2 VIEW

[w chest pa]
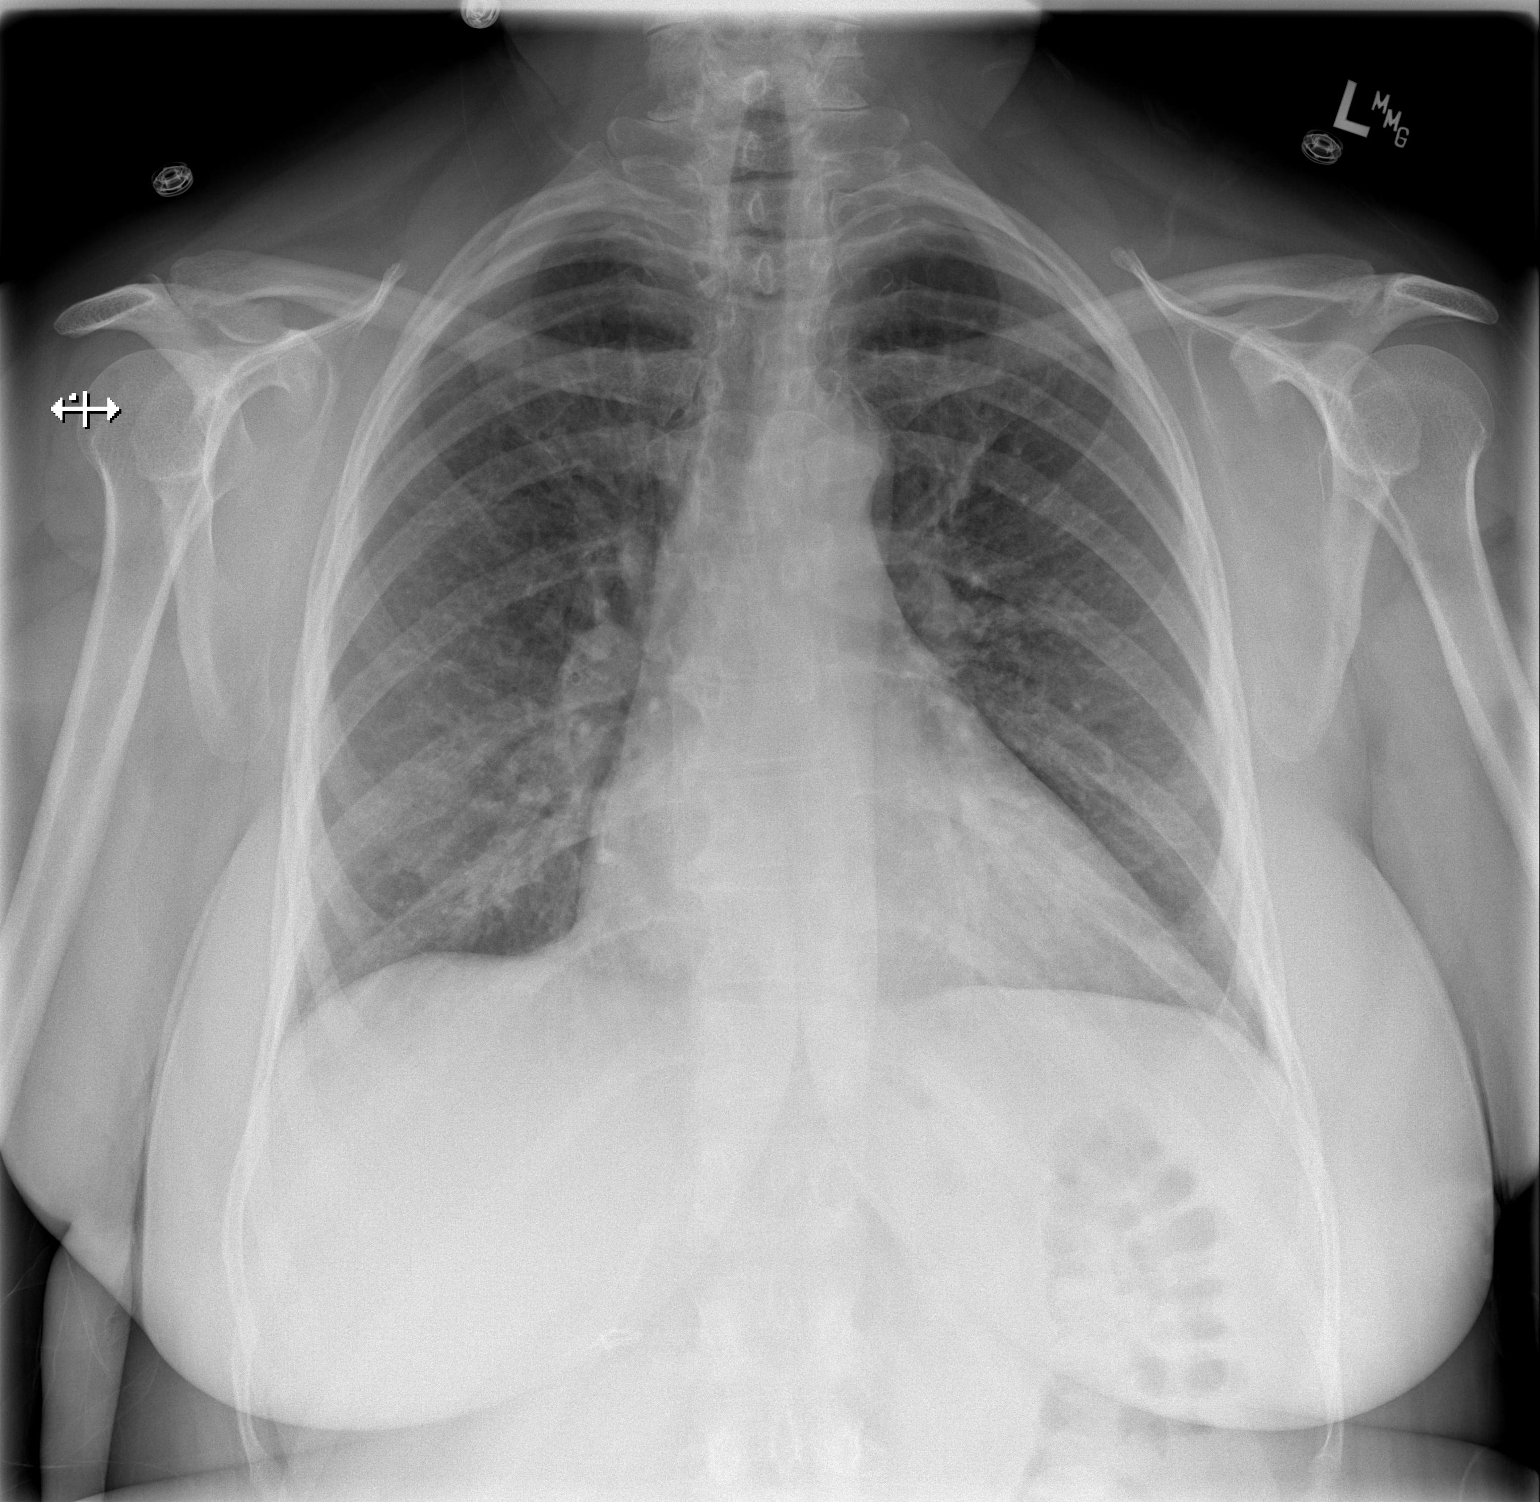

[w chest lat]
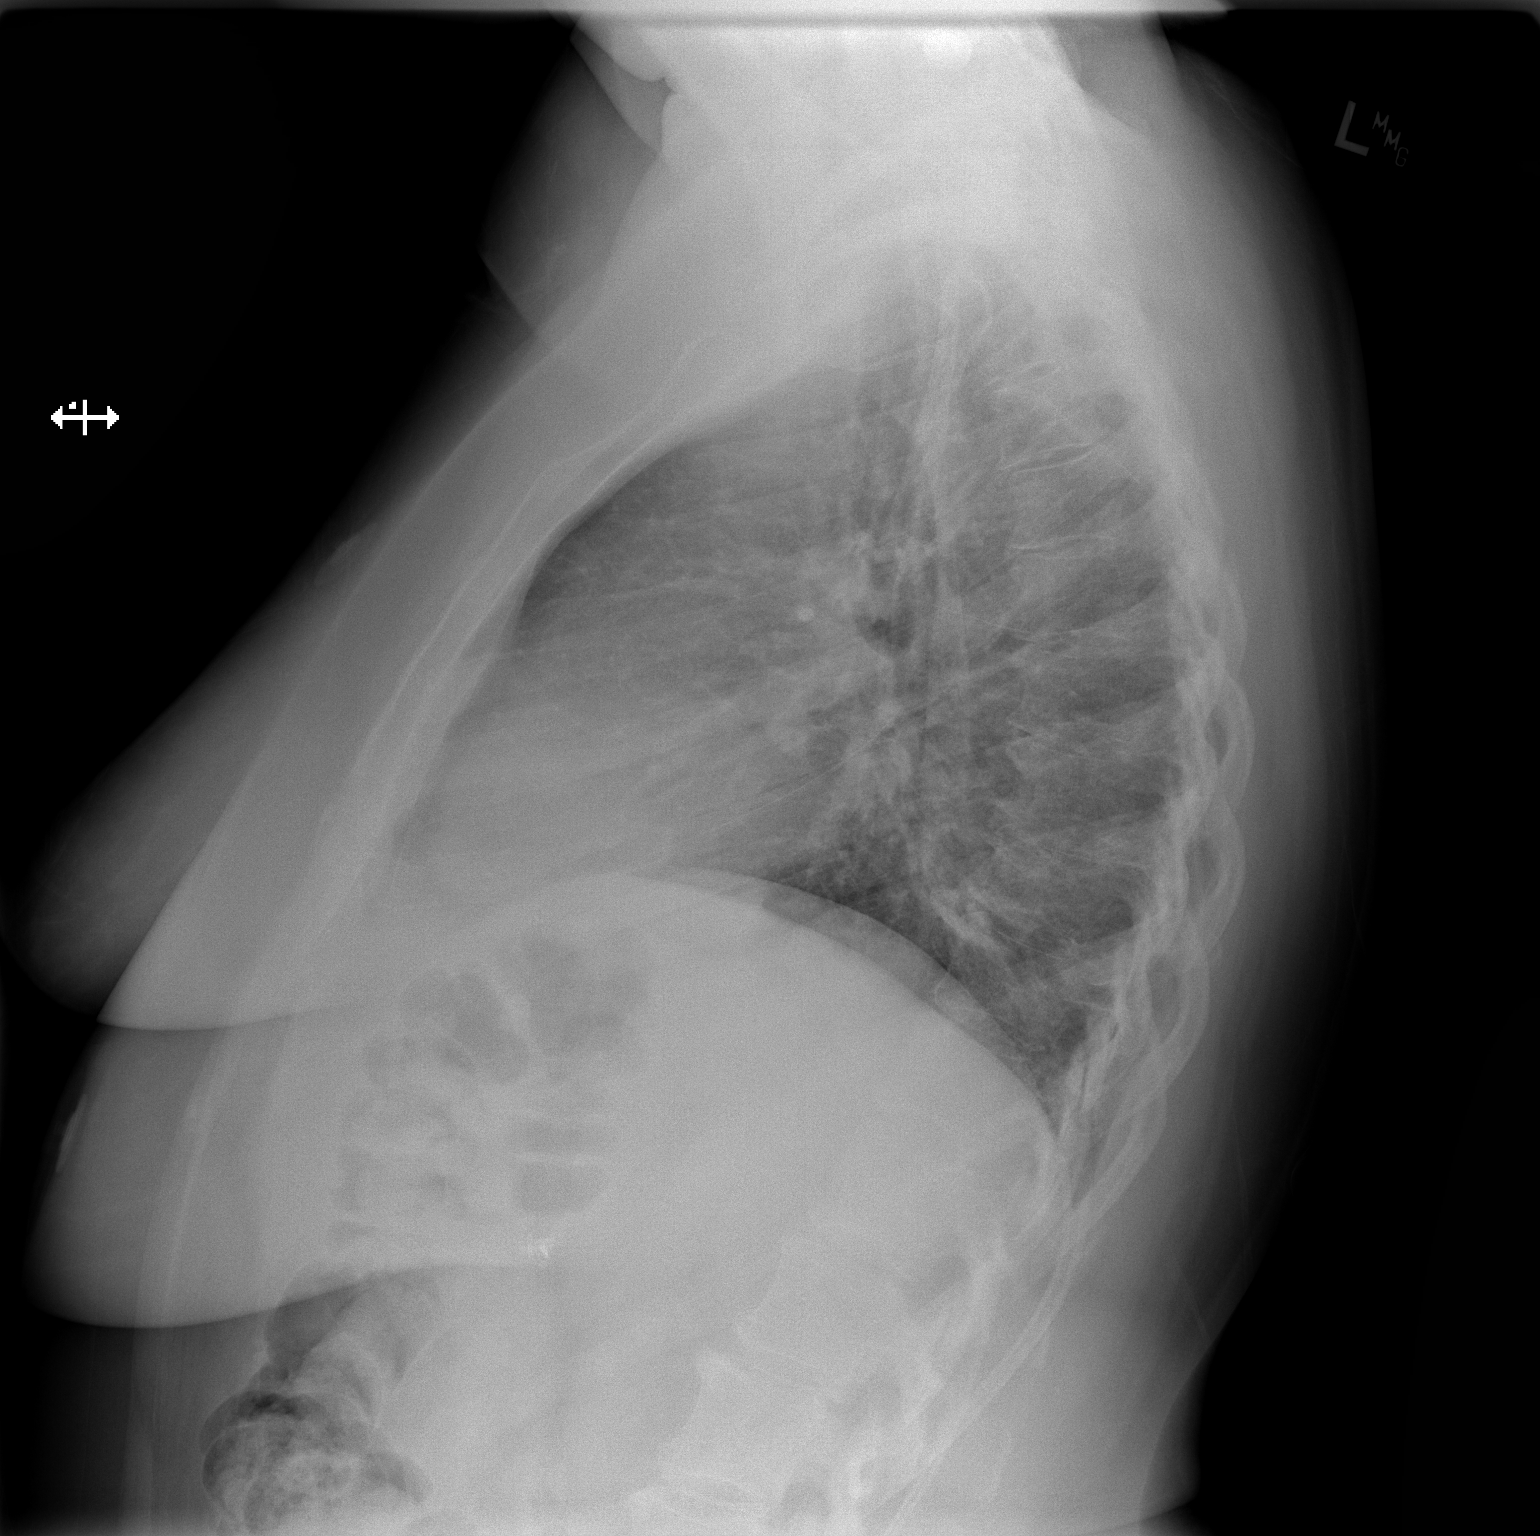

[2 of 2 positions shown; findings below may reference images not displayed]

FINDINGS: Normal heart size and mediastinal contours. No acute infiltrate or
edema. No effusion or pneumothorax. No osseous findings to explain
chest pain. Cholecystectomy.
IMPRESSION: No active cardiopulmonary disease.

## 2016-05-05 DIAGNOSIS — M6281 Muscle weakness (generalized): Secondary | ICD-10-CM | POA: Diagnosis not present

## 2016-05-05 DIAGNOSIS — R1084 Generalized abdominal pain: Secondary | ICD-10-CM | POA: Diagnosis not present

## 2016-05-05 DIAGNOSIS — M62838 Other muscle spasm: Secondary | ICD-10-CM | POA: Diagnosis not present

## 2016-05-05 DIAGNOSIS — R3912 Poor urinary stream: Secondary | ICD-10-CM | POA: Diagnosis not present

## 2016-05-05 DIAGNOSIS — R3915 Urgency of urination: Secondary | ICD-10-CM | POA: Diagnosis not present

## 2016-05-05 DIAGNOSIS — N3946 Mixed incontinence: Secondary | ICD-10-CM | POA: Diagnosis not present

## 2016-05-25 DIAGNOSIS — Z79899 Other long term (current) drug therapy: Secondary | ICD-10-CM | POA: Diagnosis not present

## 2016-05-25 DIAGNOSIS — F410 Panic disorder [episodic paroxysmal anxiety] without agoraphobia: Secondary | ICD-10-CM | POA: Diagnosis not present

## 2016-05-25 DIAGNOSIS — F4320 Adjustment disorder, unspecified: Secondary | ICD-10-CM | POA: Diagnosis not present

## 2016-05-25 DIAGNOSIS — J309 Allergic rhinitis, unspecified: Secondary | ICD-10-CM | POA: Diagnosis not present

## 2016-05-25 DIAGNOSIS — K219 Gastro-esophageal reflux disease without esophagitis: Secondary | ICD-10-CM | POA: Diagnosis not present

## 2016-05-25 DIAGNOSIS — I10 Essential (primary) hypertension: Secondary | ICD-10-CM | POA: Diagnosis not present

## 2016-05-25 DIAGNOSIS — E785 Hyperlipidemia, unspecified: Secondary | ICD-10-CM | POA: Diagnosis not present

## 2016-05-25 DIAGNOSIS — E559 Vitamin D deficiency, unspecified: Secondary | ICD-10-CM | POA: Diagnosis not present

## 2016-05-25 DIAGNOSIS — E669 Obesity, unspecified: Secondary | ICD-10-CM | POA: Diagnosis not present

## 2016-05-25 DIAGNOSIS — Z0001 Encounter for general adult medical examination with abnormal findings: Secondary | ICD-10-CM | POA: Diagnosis not present

## 2016-05-25 DIAGNOSIS — M545 Low back pain: Secondary | ICD-10-CM | POA: Diagnosis not present

## 2016-05-25 DIAGNOSIS — N281 Cyst of kidney, acquired: Secondary | ICD-10-CM | POA: Diagnosis not present

## 2016-05-28 MED FILL — METOPROLOL SUCC ER 25 MG TA: 25 | 90 days supply | Qty: 90 | Fill #0

## 2016-05-28 MED FILL — ESOMEPRAZOLE MAG DR 40 MG C: 40 | 90 days supply | Qty: 90 | Fill #1

## 2016-05-28 MED FILL — LOSARTAN POTASSIUM 100 MG T: 100 | 90 days supply | Qty: 90 | Fill #0

## 2016-05-28 MED FILL — FAMOTIDINE 20 MG TABLET: 20 | 90 days supply | Qty: 90 | Fill #1

## 2016-06-16 ENCOUNTER — Telehealth (INDEPENDENT_AMBULATORY_CARE_PROVIDER_SITE_OTHER): Payer: Self-pay | Admitting: Orthopaedic Surgery

## 2016-06-16 NOTE — Telephone Encounter (Signed)
Patient is going for a 2nd opinion and wants copy of x-rays of Ankle on a CD.  Please call when ready [phone]

## 2016-06-17 NOTE — Telephone Encounter (Signed)
Done. Patient is aware CD is ready for pick up and $5 fee

## 2016-06-18 MED FILL — DICLOFENAC SODIUM 1% GEL: 1 | 10 days supply | Qty: 100 | Fill #1

## 2016-06-18 MED FILL — IBUPROFEN 800 MG TAB: 800 | 30 days supply | Qty: 60 | Fill #1

## 2016-06-19 ENCOUNTER — Telehealth (INDEPENDENT_AMBULATORY_CARE_PROVIDER_SITE_OTHER): Payer: Self-pay | Admitting: Orthopaedic Surgery

## 2016-06-19 NOTE — Telephone Encounter (Signed)
[facility] RECORDS FAXED TO [facility]

## 2016-07-16 MED FILL — AMLODIPINE BESYLATE 5 MG TA: 5 | 90 days supply | Qty: 90 | Fill #0

## 2016-07-23 ENCOUNTER — Ambulatory Visit (INDEPENDENT_AMBULATORY_CARE_PROVIDER_SITE_OTHER): Payer: 59 | Admitting: Sports Medicine

## 2016-07-23 DIAGNOSIS — M25579 Pain in unspecified ankle and joints of unspecified foot: Secondary | ICD-10-CM | POA: Diagnosis not present

## 2016-07-23 NOTE — Progress Notes (Signed)
   HPI  CC: Bilateral ankle pain Patient is here for follow-up on her bilateral ankle pain. She states that she has worse pain with the right ankle versus the left. Patient has been dealing with this chronic ankle pain for many years. She states that she recently saw her orthopedic surgeon who had discussed the possibility of fusion of the right ankle. Patient is trying to avoid this procedure at all costs and is hoping to have new orthotics made today. Patient endorses chronic pain over the lateral and anterior aspect of the right ankle. This is made worse with prolonged ambulation. Patient states that this ankle swells on a daily basis and that she elevates it regularly.  She endorses improved symptoms with previous orthotics but has not had new ones made for ~3-4 years. She denies any recent injury or trauma. No new instability, ecchymosis, erythema, weakness, numbness, or paresthesias.  Medications/Interventions Tried: ice, compression, elevation.  See HPI and/or previous note for associated ROS.  Objective: BP (!) 144/90   Ht 5' 8.5" (1.74 m)   Wt 237 lb (107.5 kg)   LMP 07/05/2009   BMI 35.51 kg/m  Gen: NAD, well groomed, a/o x3, normal affect.  CV: Well-perfused. Warm.  Resp: Non-labored.  Neuro: Sensation intact throughout. No gross coordination deficits.  Gait: Nonpathologic posture, no balance issues. Foot pronation noted bilaterally R>L. (Post Orthotic: significant correction of talar valgus deviation and ambulatory pronation) Ankle/Foot, Bilateral: TTP noted at the lateral malleolus and talar dome bilaterally. +2 edema at the right ankle. Surgical scarring noted, but no visible erythema, ecchymosis, or bony deformity. No notable pes planus/cavus deformity and Transverse arch grossly intact; evidence of tibiotalar valgus deviation bilaterally (R>L); Range of motion is limited by 5 deg in both plantar and dosiflexion. Strength is 5/5 in all directions. No tenderness at the  insertion/body/myotendinous junction of the Achilles tendon; No peroneal tendon tenderness or subluxation; Stable lateral and medial ligaments;  squeeze and kleiger tests; Unremarkable calcaneal squeeze; No plantar calcaneal tenderness; No tenderness over the navicular prominence; No tenderness over cuboid; No pain at base of 5th MT; No tenderness at the distal metatarsals; Negative tarsal tunnel tinel's; Able to walk 4 steps.   PROCEDURE: Custom Orthotics  Patient was fitted for a standard, cushioned, semi-rigid orthotic. The orthotic was heated and afterward the patient stood on the orthotic blank positioned on the orthotic stand. The patient was positioned in subtalar neutral position and 10 degrees of ankle dorsiflexion in a weight bearing stance. After completion of molding, a stable base was applied to the orthotic blank. The blank was ground to a stable position for weight bearing. Medial heel lift and scaphoid pad added to each orthotic. Base: Red EVA Additional Posting and Padding: None The patient ambulated these, and they were very comfortable.   Assessment and plan:  ANKLE PAIN, BILATERAL Patient is here to follow up on her chronic bilateral ankle pain. Pain is gradually getting worse. She had significant relief of pain with previous orthotics made years ago. New orthotics made for patient today, with added support on the medial aspect with a scaphoid pad and heel pad. Good resolution of talar valgus and improved pronation in gait w/ orthotic use. - F/u PRN   I spent >45 minutes with this patient. Over 50% of visit was spent in counseling and coordination of care for problems with her bilateral ankle pain.   Elberta Leatherwood, MD,MS [facility] 07/23/2016 3:57 PM

## 2016-07-23 NOTE — Assessment & Plan Note (Signed)
Patient is here to follow up on her chronic bilateral ankle pain. Pain is gradually getting worse. She had significant relief of pain with previous orthotics made years ago. New orthotics made for patient today, with added support on the medial aspect with a scaphoid pad and heel pad. Good resolution of talar valgus and improved pronation in gait w/ orthotic use. - F/u PRN

## 2016-07-29 DIAGNOSIS — G4733 Obstructive sleep apnea (adult) (pediatric): Secondary | ICD-10-CM | POA: Diagnosis not present

## 2016-08-12 DIAGNOSIS — G8929 Other chronic pain: Secondary | ICD-10-CM | POA: Diagnosis not present

## 2016-08-12 DIAGNOSIS — M19171 Post-traumatic osteoarthritis, right ankle and foot: Secondary | ICD-10-CM | POA: Diagnosis not present

## 2016-08-12 DIAGNOSIS — M25571 Pain in right ankle and joints of right foot: Secondary | ICD-10-CM | POA: Diagnosis not present

## 2016-08-27 MED FILL — METOPROLOL SUCC ER 25 MG TA: 25 | 90 days supply | Qty: 90 | Fill #1

## 2016-08-27 MED FILL — cloNIDine HCL 0.1 MG TABS: 0.1 | 90 days supply | Qty: 270 | Fill #1

## 2016-08-27 MED FILL — LOSARTAN POTASSIUM 100 MG T: 100 | 90 days supply | Qty: 90 | Fill #1

## 2016-09-15 MED FILL — ESOMEPRAZOLE MAG DR 40 MG C: 40 | 90 days supply | Qty: 90 | Fill #2

## 2016-10-15 MED FILL — AMLODIPINE BESYLATE 5 MG TA: 5 | 90 days supply | Qty: 90 | Fill #1

## 2016-10-23 DIAGNOSIS — J208 Acute bronchitis due to other specified organisms: Secondary | ICD-10-CM | POA: Diagnosis not present

## 2016-10-23 DIAGNOSIS — J0100 Acute maxillary sinusitis, unspecified: Secondary | ICD-10-CM | POA: Diagnosis not present

## 2016-10-23 MED FILL — VENTOLIN HFA 90 MCG INHALER: 108 (90 BAS | 25 days supply | Qty: 18 | Fill #0

## 2016-10-23 MED FILL — AMOXICILLIN 875 MG TABLET: 875 | 10 days supply | Qty: 20 | Fill #0

## 2016-11-09 DIAGNOSIS — G4733 Obstructive sleep apnea (adult) (pediatric): Secondary | ICD-10-CM | POA: Diagnosis not present

## 2016-11-13 DIAGNOSIS — G4733 Obstructive sleep apnea (adult) (pediatric): Secondary | ICD-10-CM | POA: Diagnosis not present

## 2016-11-23 MED FILL — METOPROLOL SUCC ER 25 MG TA: 25 | 90 days supply | Qty: 90 | Fill #2

## 2016-11-23 MED FILL — LOSARTAN POTASSIUM 100 MG T: 100 | 90 days supply | Qty: 90 | Fill #2

## 2016-11-27 MED FILL — ESOMEPRAZOLE MAG DR 40 MG C: 40 | 90 days supply | Qty: 90 | Fill #3

## 2017-01-19 MED FILL — AMLODIPINE BESYLATE 5 MG TA: 5 | 90 days supply | Qty: 90 | Fill #2

## 2017-02-02 MED FILL — DICLOFENAC SODIUM 1% GEL: 1 | 30 days supply | Qty: 100 | Fill #0

## 2017-02-24 MED FILL — LOSARTAN POTASSIUM 100 MG T: 100 | 90 days supply | Qty: 90 | Fill #0

## 2017-03-09 ENCOUNTER — Other Ambulatory Visit: Payer: Self-pay

## 2017-03-09 ENCOUNTER — Encounter: Payer: Self-pay | Admitting: Certified Nurse Midwife

## 2017-03-09 ENCOUNTER — Ambulatory Visit: Payer: 59 | Admitting: Certified Nurse Midwife

## 2017-03-09 VITALS — BP 140/82 | HR 70 | Resp 16 | Ht 67.75 in | Wt 240.0 lb

## 2017-03-09 DIAGNOSIS — Z8041 Family history of malignant neoplasm of ovary: Secondary | ICD-10-CM | POA: Diagnosis not present

## 2017-03-09 DIAGNOSIS — N951 Menopausal and female climacteric states: Secondary | ICD-10-CM | POA: Diagnosis not present

## 2017-03-09 DIAGNOSIS — R1031 Right lower quadrant pain: Secondary | ICD-10-CM

## 2017-03-09 DIAGNOSIS — Z86010 Personal history of colonic polyps: Secondary | ICD-10-CM | POA: Diagnosis not present

## 2017-03-09 DIAGNOSIS — Z0000 Encounter for general adult medical examination without abnormal findings: Secondary | ICD-10-CM | POA: Diagnosis not present

## 2017-03-09 DIAGNOSIS — K625 Hemorrhage of anus and rectum: Secondary | ICD-10-CM | POA: Diagnosis not present

## 2017-03-09 DIAGNOSIS — Z01419 Encounter for gynecological examination (general) (routine) without abnormal findings: Secondary | ICD-10-CM

## 2017-03-09 LAB — POCT URINALYSIS DIPSTICK
Bilirubin, UA: NEGATIVE
Blood, UA: NEGATIVE
GLUCOSE UA: NEGATIVE
Ketones, UA: NEGATIVE
Leukocytes, UA: NEGATIVE
NITRITE UA: NEGATIVE
PROTEIN UA: NEGATIVE
Urobilinogen, UA: NEGATIVE E.U./dL — AB
pH, UA: 5 (ref 5.0–8.0)

## 2017-03-09 NOTE — Patient Instructions (Signed)
EXERCISE AND DIET:  We recommended that you start or continue a regular exercise program for good health. Regular exercise means any activity that makes your heart beat faster and makes you sweat.  We recommend exercising at least 30 minutes per day at least 3 days a week, preferably 4 or 5.  We also recommend a diet low in fat and sugar.  Inactivity, poor dietary choices and obesity can cause diabetes, heart attack, stroke, and kidney damage, among others.    ALCOHOL AND SMOKING:  Women should limit their alcohol intake to no more than 7 drinks/beers/glasses of wine (combined, not each!) per week. Moderation of alcohol intake to this level decreases your risk of breast cancer and liver damage. And of course, no recreational drugs are part of a healthy lifestyle.  And absolutely no smoking or even second hand smoke. Most people know smoking can cause heart and lung diseases, but did you know it also contributes to weakening of your bones? Aging of your skin?  Yellowing of your teeth and nails?  CALCIUM AND VITAMIN D:  Adequate intake of calcium and Vitamin D are recommended.  The recommendations for exact amounts of these supplements seem to change often, but generally speaking 600 mg of calcium (either carbonate or citrate) and 800 units of Vitamin D per day seems prudent. Certain women may benefit from higher intake of Vitamin D.  If you are among these women, your doctor will have told you during your visit.    PAP SMEARS:  Pap smears, to check for cervical cancer or precancers,  have traditionally been done yearly, although recent scientific advances have shown that most women can have pap smears less often.  However, every woman still should have a physical exam from her gynecologist every year. It will include a breast check, inspection of the vulva and vagina to check for abnormal growths or skin changes, a visual exam of the cervix, and then an exam to evaluate the size and shape of the uterus and  ovaries.  And after 61 years of age, a rectal exam is indicated to check for rectal cancers. We will also provide age appropriate advice regarding health maintenance, like when you should have certain vaccines, screening for sexually transmitted diseases, bone density testing, colonoscopy, mammograms, etc.   MAMMOGRAMS:  All women over 40 years old should have a yearly mammogram. Many facilities now offer a "3D" mammogram, which may cost around $50 extra out of pocket. If possible,  we recommend you accept the option to have the 3D mammogram performed.  It both reduces the number of women who will be called back for extra views which then turn out to be normal, and it is better than the routine mammogram at detecting truly abnormal areas.    COLONOSCOPY:  Colonoscopy to screen for colon cancer is recommended for all women at age 50.  We know, you hate the idea of the prep.  We agree, BUT, having colon cancer and not knowing it is worse!!  Colon cancer so often starts as a polyp that can be seen and removed at colonscopy, which can quite literally save your life!  And if your first colonoscopy is normal and you have no family history of colon cancer, most women don't have to have it again for 10 years.  Once every ten years, you can do something that may end up saving your life, right?  We will be happy to help you get it scheduled when you are ready.    Be sure to check your insurance coverage so you understand how much it will cost.  It may be covered as a preventative service at no cost, but you should check your particular policy.       Abdominal Pain, Adult Many things can cause belly (abdominal) pain. Most times, belly pain is not dangerous. Many cases of belly pain can be watched and treated at home. Sometimes belly pain is serious, though. Your doctor will try to find the cause of your belly pain. Follow these instructions at home:  Take over-the-counter and prescription medicines only as told by  your doctor. Do not take medicines that help you poop (laxatives) unless told to by your doctor.  Drink enough fluid to keep your pee (urine) clear or pale yellow.  Watch your belly pain for any changes.  Keep all follow-up visits as told by your doctor. This is important. Contact a doctor if:  Your belly pain changes or gets worse.  You are not hungry, or you lose weight without trying.  You are having trouble pooping (constipated) or have watery poop (diarrhea) for more than 2-3 days.  You have pain when you pee or poop.  Your belly pain wakes you up at night.  Your pain gets worse with meals, after eating, or with certain foods.  You are throwing up and cannot keep anything down.  You have a fever. Get help right away if:  Your pain does not go away as soon as your doctor says it should.  You cannot stop throwing up.  Your pain is only in areas of your belly, such as the right side or the left lower part of the belly.  You have bloody or black poop, or poop that looks like tar.  You have very bad pain, cramping, or bloating in your belly.  You have signs of not having enough fluid or water in your body (dehydration), such as: ? Dark pee, very little pee, or no pee. ? Cracked lips. ? Dry mouth. ? Sunken eyes. ? Sleepiness. ? Weakness. This information is not intended to replace advice given to you by your health care provider. Make sure you discuss any questions you have with your health care provider. Document Released: 06/10/2007 Document Revised: 07/12/2015 Document Reviewed: 06/05/2015 Elsevier Interactive Patient Education  2018 [employer].

## 2017-03-09 NOTE — Progress Notes (Signed)
62 y.o. [id number] Married  African American Fe here for annual exam. Menopausal no HRT. Denies vaginal dryness issues. Denies vaginal bleeding. Sees PCP for hypertension, anxiety, cholesterol management, aex and labs. Patient noted blood in stool (port wine color), not outside stool. Complaining of some RLQ pain which she notes more at hs when she lays down, for 2-3 weeks. Denies any constipation, but had been on aspirin and stopped. Has noted blood in the past 4 days. Has GI Dr. Oletta Lamas and due for colonoscopy this year. Patient has noted her urine stream is still smaller and is still leaking. Still having some hot flashes only at night, no issues. Did do genetic counseling and chose not to go forward with screening. Requests CA125. Sister is BRACA negative. Aware she did not have mammogram last year. No other health concerns today.  Patient's last menstrual period was 07/05/2009.          Sexually active: Yes.    The current method of family planning is post menopausal status.    Exercising: Yes.    glider Smoker:  no  Health Maintenance: Pap:  02-20-14 neg HPV HR neg, 03-03-16 neg History of Abnormal Pap: yes per patient MMG:  09-02-15 category b density birads 1:neg Self Breast exams: yes Colonoscopy:  2009 f/u 7yrs with polyp BMD:   2015  normal TDaP:  2012 Shingles: no Pneumonia: had done Hep C and HIV: neg for both Labs: poct urine-neg Flu vaccine: yes   reports that  has never smoked. she has never used smokeless tobacco. She reports that she does not drink alcohol or use drugs.  Past Medical History:  Diagnosis Date  . Anemia    as a child  . Anxiety   . Bulging lumbar disc   . GERD (gastroesophageal reflux disease)   . Hypertension   . Lactose intolerance   . MVP (mitral valve prolapse)   . Pyelonephritis   . Renal disorder   . Sleep apnea   . Spinal stenosis     Past Surgical History:  Procedure Laterality Date  . ankle fracture  10/09   bilateral-right was  surgically repaired  . ANKLE SURGERY  10/10   screws removed  . ANKLE SURGERY  2/12   removal of all hardware right ankle  . APPENDECTOMY    . arm surgery     plate in left arm  . BREAST LUMPECTOMY     right breast  . CHOLECYSTECTOMY    . DILATION AND CURETTAGE OF UTERUS      Current Outpatient Medications  Medication Sig Dispense Refill  . amLODipine (NORVASC) 5 MG tablet Take 5 mg by mouth daily.    Marland Kitchen aspirin 81 MG tablet Take 81 mg by mouth daily.    . Calcium-Magnesium-Vitamin D (CALCIUM 1200+D3) 600-40-500 MG-MG-UNIT TB24 Take 3 tablets by mouth daily.    . cloNIDine (CATAPRES) 0.1 MG tablet Take 0.1 mg by mouth 2 (two) times daily.     Marland Kitchen esomeprazole (NEXIUM) 40 MG capsule Take 40 mg by mouth daily.     . famotidine (PEPCID) 20 MG tablet   3  . ibuprofen (ADVIL,MOTRIN) 800 MG tablet Take 800 mg by mouth every 12 (twelve) hours as needed for moderate pain. Every 12 hours prn     . Liniments (SALONPAS EX) Apply 1 each topically daily as needed (pain).    Marland Kitchen losartan (COZAAR) 100 MG tablet Take 100 mg by mouth daily.    . metoprolol succinate (TOPROL-XL) 25 MG  24 hr tablet Take 25 mg by mouth daily.   1  . nitroGLYCERIN (NITROSTAT) 0.4 MG SL tablet Place 1 tablet (0.4 mg total) under the tongue every 5 (five) minutes as needed for chest pain. 30 tablet 12   No current facility-administered medications for this visit.     Family History  Problem Relation Age of Onset  . Diabetes Maternal Grandmother   . Stroke Maternal Grandmother   . Kidney disease Maternal Grandmother   . Ovarian cancer Paternal Grandmother        dx. late 30s-early 56s  . Prostate cancer Paternal Grandfather   . Ovarian cancer Paternal Aunt        (x3) paternal aunts dx. ages 62s-late 70s  . Breast cancer Maternal Aunt        dx. 28s  . Hypertension Mother   . Heart disease Mother   . Goiter Mother   . Other Mother        breast lumpectomy in early 80s, was in the hospital for 5 days after  . Heart  disease Father   . Prostate cancer Father 8       s/p orchiectomy  . Stroke Father   . Ulcers Brother   . Other Brother        elevated PSA level w/ surveillance  . Colon polyps Brother        less than 10  . Pyelonephritis Daughter   . Breast cancer Sister 22       left breast ca - poorly differentiated carcinoma with squamous metaplasia; triple neg; has since had BL mastectomies  . Colon polyps Sister        less than 10  . Congestive Heart Failure Sister   . Prostate cancer Paternal Uncle        (x5) paternal uncles dx. prostate cancer in their 64s  . Prostate cancer Maternal Grandfather        d. late 37s with mets to colon  . Other Other 39       NOS benign brain tumor; +fluid  . Prostate cancer Cousin 20       paternal 1st cousin   . Prostate cancer Cousin        paternal 1st cousin, once-removed dx. 57s  . Prostate cancer Cousin        (x2) paternal 1st cousins, once-removed, dx. early 15s  . Lung cancer Brother 27       paternal half-brother; smoker  . Uterine cancer Maternal Aunt        dx. 30s  . Breast cancer Maternal Aunt        dx. bilateral breast cancers - 30s, 74s  . Liver cancer Maternal Aunt        +hepatitis C; d. 31s  . Congestive Heart Failure Maternal Uncle 68  . Stroke Maternal Uncle        d. 60s; (x2 maternal uncles)  . Cancer Cousin 52       sinus cancer dx. 64s (maternal 1st cousin)  . Lung cancer Cousin        maternal 1st cousin dx. 60s/smoker; maternal 1st cousin  . Pancreatic cancer Cousin        maternal 1st cousin dx. late 69s  . Prostate cancer Cousin        maternal 1st cousin dx. 81  . Breast cancer Cousin        (x4) maternal 1st cousins dx. late 64s, 62s    ROS:  Pertinent items are noted in HPI.  Otherwise, a comprehensive ROS was negative.  Exam:   LMP 07/05/2009    Ht Readings from Last 3 Encounters:  07/23/16 5' 8.5" (1.74 m)  03/03/16 5' 7.75" (1.721 m)  01/14/16 5\' 8"  (1.727 m)    General appearance: alert,  cooperative and appears stated age Head: Normocephalic, without obvious abnormality, atraumatic Neck: no adenopathy, supple, symmetrical, trachea midline and thyroid normal to inspection and palpation Lungs: clear to auscultation bilaterally Breasts: normal appearance, no masses or tenderness, No nipple retraction or dimpling, No nipple discharge or bleeding, No axillary or supraclavicular adenopathy Heart: regular rate and rhythm Abdomen: soft, non-tender; no masses,  no organomegaly, negative suprapubic, RMQ very slight discomfort with deep palpation, no rebound, no guarding. Positive bowel sounds all 4 quadrants. Extremities: extremities normal, atraumatic, no cyanosis or edema Skin: Skin color, texture, turgor normal. No rashes or lesions Lymph nodes: Cervical, supraclavicular, and axillary nodes normal. No abnormal inguinal nodes palpated Neurologic: Grossly normal   Pelvic: External genitalia:  no lesions,  Normal female              Urethra:  normal appearing urethra with no masses, tenderness or lesions              Bartholin's and Skene's: normal                 Vagina: normal appearing vagina with normal color and discharge, no lesions, mild cystocele and rectocele noted              Cervix: no cervical motion tenderness, no lesions and normal appearance              Pap taken: No. Bimanual Exam:  Uterus:  normal size, contour, position, consistency, mobility, non-tender              Adnexa: normal adnexa and no mass, fullness, tenderness               Rectovaginal: Confirms               Anus:  normal sphincter tone, no lesions, hemorrhoid tags noted, no bleeding, no blood on rectal exam or pain  Chaperone present: yes  A:  Well Woman with normal exam  Menopausal no HRT  Blood in stool, history of polyp, colonoscopy due  RMQ occasional pain  BMD and Mammogram due  Family history of Ovarian cancer, had genetic consultation, declined screening, request Ca 125  P:   Reviewed  health and wellness pertinent to exam  Aware of need to evaluate if vaginal bleeding  Discussed needs follow up with abdominal occasional pain and rectal bleeding will referr to Dr. Oletta Lamas prior to leaving today. Warning signs of abdominal pain given  Lab: CBC with diff  Discussed no definite screening for ovarian cancer. Patient is aware. Request lab and may consider PUS.  Lab CA 125  Patient to schedule mammogram and BMD, will call if needs assistance  Pap smear: no   counseled on breast self exam, mammography screening, feminine hygiene, adequate intake of calcium and vitamin D, diet and exercise  return annually or prn  An After Visit Summary was printed and given to the patient.

## 2017-03-09 NOTE — Progress Notes (Signed)
Scheduled patient while in office to see [email or web address] with [facility] on 03/18/2017 at 8:30 am. Patient is agreeable to date and time.

## 2017-03-10 LAB — CBC WITH DIFFERENTIAL/PLATELET
BASOS ABS: 0.1 10*3/uL (ref 0.0–0.2)
BASOS: 1 %
EOS (ABSOLUTE): 0.3 10*3/uL (ref 0.0–0.4)
EOS: 3 %
HEMATOCRIT: 41.9 % (ref 34.0–46.6)
HEMOGLOBIN: 14.1 g/dL (ref 11.1–15.9)
IMMATURE GRANS (ABS): 0 10*3/uL (ref 0.0–0.1)
Immature Granulocytes: 0 %
LYMPHS ABS: 3 10*3/uL (ref 0.7–3.1)
LYMPHS: 27 %
MCH: 28.8 pg (ref 26.6–33.0)
MCHC: 33.7 g/dL (ref 31.5–35.7)
MCV: 86 fL (ref 79–97)
Monocytes Absolute: 0.6 10*3/uL (ref 0.1–0.9)
Monocytes: 6 %
Neutrophils Absolute: 7 10*3/uL (ref 1.4–7.0)
Neutrophils: 63 %
Platelets: 309 10*3/uL (ref 150–379)
RBC: 4.9 x10E6/uL (ref 3.77–5.28)
RDW: 15 % (ref 12.3–15.4)
WBC: 11.1 10*3/uL — ABNORMAL HIGH (ref 3.4–10.8)

## 2017-03-10 LAB — VITAMIN D 25 HYDROXY (VIT D DEFICIENCY, FRACTURES): Vit D, 25-Hydroxy: 42.9 ng/mL (ref 30.0–100.0)

## 2017-03-10 LAB — CA 125: CANCER ANTIGEN (CA) 125: 15.8 U/mL (ref 0.0–38.1)

## 2017-03-11 ENCOUNTER — Telehealth: Payer: Self-pay

## 2017-03-11 NOTE — Telephone Encounter (Signed)
Spoke with patient. Results given. Patient verbalizes understanding. PUS scheduled for 03/18/2017 at 2 pm with 2:30 pm consult with Dr.Miller. Patient is agreeable to date and time. States that Melvia Heaps CNM discussed the patient having urodynamics performed. Advised will review with Melvia Heaps CNM and return call.  Melvia Heaps CNM I do not see urodynamics noted for patient.Please advise.

## 2017-03-11 NOTE — Telephone Encounter (Signed)
Spoke with patient. Advised of message as seen below from [place]. Patient verbalizes understanding. Patient is asking to move her PUS appointment to 3 pm. Appointment moved to 03/18/2017 at 3 pm with 3:30 pm consult with Dr.Miller. Patient is agreeable. Encounter closed.

## 2017-03-11 NOTE — Addendum Note (Signed)
Addended by: Regina Eck on: 03/11/2017 07:18 AM   Modules accepted: Orders

## 2017-03-11 NOTE — Telephone Encounter (Signed)
-----   Message from Regina Eck, CNM sent at 03/11/2017  7:18 AM EST ----- Notify patient Vitamin D is normal Ca 125 level is normal CBC is normal with very slight elevation of WBC, but differential is normal, no anemia noted. Order placed for referral regarding rectal bleeding.  Did she want to go ahead and schedule PUS due family history of ovarian cancer and she has had some RMQ pain( she thought related to stools, but unsure. I would like her to schedule PUS.

## 2017-03-11 NOTE — Telephone Encounter (Signed)
I did discuss, but we need to take care of the blood in stool first, PUS and then schedule her for consult with Dr. Quincy Simmonds regarding testing

## 2017-03-12 MED FILL — ESOMEPRAZOLE MAG DR 40 MG C: 40 | 90 days supply | Qty: 90 | Fill #0

## 2017-03-12 MED FILL — CloNIDine HCL 0.1 MG TAB: 0.1 | 90 days supply | Qty: 270 | Fill #0

## 2017-03-12 MED FILL — METOPROLOL SUCCINATE ER 25: 25 | 90 days supply | Qty: 90 | Fill #0

## 2017-03-18 ENCOUNTER — Other Ambulatory Visit (HOSPITAL_COMMUNITY): Payer: Self-pay | Admitting: Gastroenterology

## 2017-03-18 ENCOUNTER — Other Ambulatory Visit: Payer: Self-pay | Admitting: Gastroenterology

## 2017-03-18 ENCOUNTER — Other Ambulatory Visit: Payer: 59

## 2017-03-18 ENCOUNTER — Other Ambulatory Visit: Payer: Self-pay

## 2017-03-18 ENCOUNTER — Ambulatory Visit (INDEPENDENT_AMBULATORY_CARE_PROVIDER_SITE_OTHER): Payer: 59

## 2017-03-18 ENCOUNTER — Other Ambulatory Visit: Payer: 59 | Admitting: Obstetrics & Gynecology

## 2017-03-18 ENCOUNTER — Ambulatory Visit (INDEPENDENT_AMBULATORY_CARE_PROVIDER_SITE_OTHER): Payer: 59 | Admitting: Obstetrics & Gynecology

## 2017-03-18 VITALS — BP 138/80 | HR 76 | Resp 16 | Wt 241.0 lb

## 2017-03-18 DIAGNOSIS — K921 Melena: Secondary | ICD-10-CM

## 2017-03-18 DIAGNOSIS — J029 Acute pharyngitis, unspecified: Secondary | ICD-10-CM | POA: Diagnosis not present

## 2017-03-18 DIAGNOSIS — K148 Other diseases of tongue: Secondary | ICD-10-CM | POA: Diagnosis not present

## 2017-03-18 DIAGNOSIS — Z8041 Family history of malignant neoplasm of ovary: Secondary | ICD-10-CM

## 2017-03-18 DIAGNOSIS — K219 Gastro-esophageal reflux disease without esophagitis: Secondary | ICD-10-CM | POA: Diagnosis not present

## 2017-03-18 DIAGNOSIS — R1031 Right lower quadrant pain: Secondary | ICD-10-CM | POA: Diagnosis not present

## 2017-03-18 DIAGNOSIS — E2839 Other primary ovarian failure: Secondary | ICD-10-CM

## 2017-03-18 NOTE — Progress Notes (Signed)
62 y.o. [id number] Married [place] American female here for pelvic ultrasound due to RLQ pain that has been present for 3-4 weeks.  Was recently seen by Melvia Heaps, CNM, for AEX.  Reported this pain when she was here.  Has seen Dr. Oletta Lamas, GI, and she is having evaluation for possible diverticulosis as well.  CT scheduled for 03/24/17.  Ca-125 was normal on 03/09/16..  Was 15.8 at that time.  A separate question--pt was advised at AEX with Johny Shock to have a BMD but when [facility], was advised she needed an order.  Asks if this can be done today.  Patient's last menstrual period was 07/05/2009.  Contraception: PMP  Findings:  UTERUS: 7.6 x 4.9 x 2.9cm containing a 1.7cm fibroid.  EMS:2.62mm ADNEXA: Left ovary: 1.9 x 1.0 x 0.9cm       Right ovary: 2.6 x 1.1 x 7.8MV with a 19mm follicle present.  Otherwise, no evidence of cystic findings, masses, or free fluid on/near ovary CUL DE SAC: no free fluid  Discussion:  Comparison of ultrasound today made with prior ultrasound 3/17.  Fibroid was present at that time.  Pt reassured about ultrasound findings.  No evidence on ultrasound of ovarian disease.  Requests copy to be sent to Dr. Oletta Lamas.    Assessment:  RLQ pain Hypoestrogen status  Plan:  Pt will proceed with GI evaluation with Dr. Oletta Lamas.  Copy of ultrasound will be sent to him. Order for BMD placed today.  ~15 minutes spent with patient >50% of time was in face to face discussion of above.

## 2017-03-21 ENCOUNTER — Encounter: Payer: Self-pay | Admitting: Obstetrics & Gynecology

## 2017-03-23 ENCOUNTER — Other Ambulatory Visit (HOSPITAL_COMMUNITY): Payer: Self-pay | Admitting: Gastroenterology

## 2017-03-23 DIAGNOSIS — K921 Melena: Secondary | ICD-10-CM

## 2017-03-24 ENCOUNTER — Ambulatory Visit (HOSPITAL_COMMUNITY)
Admission: RE | Admit: 2017-03-24 | Discharge: 2017-03-24 | Disposition: A | Discharge status: Home / Self Care | Payer: 59 | Source: Ambulatory Visit | Attending: Gastroenterology | Admitting: Gastroenterology

## 2017-03-24 DIAGNOSIS — K760 Fatty (change of) liver, not elsewhere classified: Secondary | ICD-10-CM | POA: Insufficient documentation

## 2017-03-24 DIAGNOSIS — K921 Melena: Secondary | ICD-10-CM | POA: Insufficient documentation

## 2017-03-24 DIAGNOSIS — N281 Cyst of kidney, acquired: Secondary | ICD-10-CM | POA: Insufficient documentation

## 2017-03-24 DIAGNOSIS — G4733 Obstructive sleep apnea (adult) (pediatric): Secondary | ICD-10-CM | POA: Diagnosis not present

## 2017-03-26 DIAGNOSIS — H40013 Open angle with borderline findings, low risk, bilateral: Secondary | ICD-10-CM | POA: Diagnosis not present

## 2017-03-26 DIAGNOSIS — H43393 Other vitreous opacities, bilateral: Secondary | ICD-10-CM | POA: Diagnosis not present

## 2017-03-26 DIAGNOSIS — H40053 Ocular hypertension, bilateral: Secondary | ICD-10-CM | POA: Diagnosis not present

## 2017-03-26 DIAGNOSIS — H04203 Unspecified epiphora, bilateral lacrimal glands: Secondary | ICD-10-CM | POA: Diagnosis not present

## 2017-03-26 DIAGNOSIS — H524 Presbyopia: Secondary | ICD-10-CM | POA: Diagnosis not present

## 2017-03-26 DIAGNOSIS — H52223 Regular astigmatism, bilateral: Secondary | ICD-10-CM | POA: Diagnosis not present

## 2017-03-26 DIAGNOSIS — H5203 Hypermetropia, bilateral: Secondary | ICD-10-CM | POA: Diagnosis not present

## 2017-04-01 MED FILL — PEG-3350 SOLUTION: 420 | 1 days supply | Qty: 4000 | Fill #0

## 2017-04-21 MED FILL — AMLODIPINE BESYLATE 5 MG TA: 5 | 90 days supply | Qty: 90 | Fill #0

## 2017-04-23 ENCOUNTER — Other Ambulatory Visit: Payer: Self-pay | Admitting: Internal Medicine

## 2017-04-23 DIAGNOSIS — Z139 Encounter for screening, unspecified: Secondary | ICD-10-CM

## 2017-05-17 MED FILL — ALPRAZolam 0.25 MG TABS: 0.25 | 5 days supply | Qty: 10 | Fill #0

## 2017-05-17 MED FILL — IBUPROFEN 800 MG TAB: 800 | 30 days supply | Qty: 60 | Fill #0

## 2017-05-26 MED FILL — LOSARTAN POTASSIUM 100 MG T: 100 | 90 days supply | Qty: 90 | Fill #1

## 2017-06-14 MED FILL — METOPROLOL SUCCINATE ER 25: 25 | 90 days supply | Qty: 90 | Fill #1

## 2017-06-14 MED FILL — ESOMEPRAZOLE MAG DR 40 MG C: 40 | 90 days supply | Qty: 90 | Fill #1

## 2017-08-02 MED FILL — AMLODIPINE BESYLATE 5 MG TA: 5 | 90 days supply | Qty: 90 | Fill #1

## 2017-08-25 MED FILL — PEG-3350 SOLUTION: 420 | 1 days supply | Qty: 4000 | Fill #0

## 2017-08-25 MED FILL — LOSARTAN POTASSIUM 100 MG T: 100 | 90 days supply | Qty: 90 | Fill #2

## 2017-08-30 DIAGNOSIS — R1031 Right lower quadrant pain: Secondary | ICD-10-CM | POA: Diagnosis not present

## 2017-08-30 DIAGNOSIS — K641 Second degree hemorrhoids: Secondary | ICD-10-CM | POA: Diagnosis not present

## 2017-08-30 DIAGNOSIS — K219 Gastro-esophageal reflux disease without esophagitis: Secondary | ICD-10-CM | POA: Diagnosis not present

## 2017-08-30 DIAGNOSIS — K317 Polyp of stomach and duodenum: Secondary | ICD-10-CM | POA: Diagnosis not present

## 2017-08-30 DIAGNOSIS — K921 Melena: Secondary | ICD-10-CM | POA: Diagnosis not present

## 2017-09-01 ENCOUNTER — Other Ambulatory Visit (HOSPITAL_COMMUNITY): Payer: Self-pay | Admitting: Gastroenterology

## 2017-09-01 DIAGNOSIS — K219 Gastro-esophageal reflux disease without esophagitis: Secondary | ICD-10-CM

## 2017-09-15 MED FILL — METOPROLOL SUCCINATE ER 25: 25 | 90 days supply | Qty: 90 | Fill #2

## 2017-09-15 MED FILL — ESOMEPRAZOLE MAG DR 40 MG C: 40 | 90 days supply | Qty: 90 | Fill #2

## 2017-09-20 ENCOUNTER — Encounter (HOSPITAL_COMMUNITY): Payer: 59

## 2017-09-27 DIAGNOSIS — J209 Acute bronchitis, unspecified: Secondary | ICD-10-CM | POA: Diagnosis not present

## 2017-09-27 DIAGNOSIS — K219 Gastro-esophageal reflux disease without esophagitis: Secondary | ICD-10-CM | POA: Diagnosis not present

## 2017-09-27 DIAGNOSIS — I10 Essential (primary) hypertension: Secondary | ICD-10-CM | POA: Diagnosis not present

## 2017-09-27 MED FILL — SULFAMETHOXAZOLE-TMP DS TAB: 800-160 | 10 days supply | Qty: 20 | Fill #0

## 2017-09-29 DIAGNOSIS — J019 Acute sinusitis, unspecified: Secondary | ICD-10-CM | POA: Diagnosis not present

## 2017-09-29 DIAGNOSIS — R05 Cough: Secondary | ICD-10-CM | POA: Diagnosis not present

## 2017-09-29 MED FILL — BENZONATATE 200 MG CAPS: 200 | 10 days supply | Qty: 30 | Fill #0

## 2017-10-01 ENCOUNTER — Encounter (HOSPITAL_COMMUNITY): Payer: Self-pay

## 2017-10-01 ENCOUNTER — Ambulatory Visit (INDEPENDENT_AMBULATORY_CARE_PROVIDER_SITE_OTHER): Payer: 59

## 2017-10-01 ENCOUNTER — Ambulatory Visit (HOSPITAL_COMMUNITY)
Admission: EM | Admit: 2017-10-01 | Discharge: 2017-10-01 | Disposition: A | Discharge status: Home / Self Care | Payer: 59 | Attending: Family Medicine | Admitting: Family Medicine

## 2017-10-01 DIAGNOSIS — J40 Bronchitis, not specified as acute or chronic: Secondary | ICD-10-CM

## 2017-10-01 DIAGNOSIS — R05 Cough: Secondary | ICD-10-CM | POA: Diagnosis not present

## 2017-10-01 DIAGNOSIS — R0602 Shortness of breath: Secondary | ICD-10-CM | POA: Diagnosis not present

## 2017-10-01 MED ORDER — AZITHROMYCIN 250 MG PO TABS: 250.0000 mg | ORAL_TABLET | Freq: Every day | ORAL | 0 refills | Status: DC

## 2017-10-01 MED ORDER — PREDNISONE 10 MG (21) PO TBPK: ORAL_TABLET | ORAL | 0 refills | Status: DC

## 2017-10-01 MED ORDER — IPRATROPIUM-ALBUTEROL 0.5-2.5 (3) MG/3ML IN SOLN
3.0000 mL | Freq: Once | RESPIRATORY_TRACT | Status: AC
  Administered 2017-10-01: 3 mL via RESPIRATORY_TRACT

## 2017-10-01 MED ORDER — IPRATROPIUM-ALBUTEROL 0.5-2.5 (3) MG/3ML IN SOLN
RESPIRATORY_TRACT | Status: AC
  Filled 2017-10-01: qty 3

## 2017-10-01 MED ORDER — ALBUTEROL SULFATE HFA 108 (90 BASE) MCG/ACT IN AERS: 1.0000 | INHALATION_SPRAY | Freq: Four times a day (QID) | RESPIRATORY_TRACT | 0 refills | Status: DC | PRN

## 2017-10-01 MED FILL — VENTOLIN HFA 90 MCG INHALER: 108 (90 BAS | 25 days supply | Qty: 18 | Fill #0

## 2017-10-01 MED FILL — predniSONE 10 MG TABS: 10 | 6 days supply | Qty: 21 | Fill #0

## 2017-10-01 MED FILL — AZITHROMYCIN 250 MG TABLET: 250 | 5 days supply | Qty: 6 | Fill #0

## 2017-10-01 NOTE — ED Provider Notes (Signed)
[facility]    CSN: [id number] Arrival date & time: 10/01/17  0841     History   Chief Complaint Chief Complaint  Patient presents with  . Cough    HPI [name] is a 62 y.o. female.   She is a 62 year old female who presents with 1 week of worsening cough, congestion.  She reports thick yellow/green mucus.  Unable to lie flat due to the mucus.  Mild shortness of breath with exertion.  She was seen by her GI physician on Monday and he listen to her lungs and prescribed her Bactrim.  She is not better she is only worse.  She does not have a history of asthma, COPD.  She does not smoke.  She has had mild sore throat due to cough.  She denies any fever but has had some body aches and chills.   ROS per HPI    Cough    Past Medical History:  Diagnosis Date  . Abnormal Pap smear of cervix   . Anemia    as a child  . Anxiety   . Bulging lumbar disc   . GERD (gastroesophageal reflux disease)   . Hypertension   . Lactose intolerance   . MVP (mitral valve prolapse)   . Pyelonephritis   . Renal disorder   . Sleep apnea   . Spinal stenosis     Patient Active Problem List   Diagnosis Date Noted  . Family history of breast cancer in female 04/12/2015  . Family history of ovarian cancer 04/12/2015  . Cervical radiculopathy 04/06/2014  . Carpal tunnel syndrome of left wrist 04/06/2014  . Left arm pain 10/27/2013  . Cervical disc disorder with radiculopathy of cervical region 10/27/2013  . Ischemic chest pain 10/17/2013  . Chest pain 10/17/2013  . Obstructive sleep apnea 06/28/2013  . Dizziness 06/02/2013  . Headache(784.0) 06/02/2013  . Drowsiness 06/02/2013  . Neck pain 06/02/2013  . ABNORMALITY OF GAIT 03/15/2009  . ANKLE PAIN, BILATERAL 03/14/2009  . Pain in limb 03/14/2009    Past Surgical History:  Procedure Laterality Date  . ankle fracture  10/09   bilateral-right was surgically repaired  . ANKLE SURGERY  10/10   screws removed  .  ANKLE SURGERY  2/12   removal of all hardware right ankle  . APPENDECTOMY    . arm surgery     plate in left arm  . BREAST LUMPECTOMY     right breast  . CHOLECYSTECTOMY    . DILATION AND CURETTAGE OF UTERUS      OB History    Gravida  4   Para  2   Term      Preterm      AB  2   Living  2     SAB  2   TAB      Ectopic      Multiple      Live Births           Obstetric Comments  1 stepchild         Home Medications    Prior to Admission medications   Medication Sig Start Date End Date Taking? Authorizing Provider  sulfamethoxazole-trimethoprim (BACTRIM DS,SEPTRA DS) 800-160 MG tablet Take 1 tablet by mouth 2 (two) times daily.   Yes [provider]  albuterol (PROVENTIL HFA;VENTOLIN HFA) 108 (90 Base) MCG/ACT inhaler Inhale 1-2 puffs into the lungs every 6 (six) hours as needed for wheezing or shortness of breath. 10/01/17  Cyrilla Durkin A, NP  amLODipine (NORVASC) 5 MG tablet Take 5 mg by mouth daily.    [provider]  azithromycin (ZITHROMAX) 250 MG tablet Take 1 tablet (250 mg total) by mouth daily. Take first 2 tablets together, then 1 every day until finished. 10/01/17   Loura Halt A, NP  Calcium-Magnesium-Vitamin D (CALCIUM 1200+D3) 600-40-500 MG-MG-UNIT TB24 Take 3 tablets by mouth daily.    [provider]  Cimetidine (TAGAMET HB PO) Take by mouth.    [provider]  cloNIDine (CATAPRES) 0.1 MG tablet Take 0.1 mg by mouth 2 (two) times daily.     [provider]  diphenhydrAMINE (BENADRYL) 25 mg capsule Take 25 mg by mouth every 6 (six) hours as needed.    [provider]  esomeprazole (NEXIUM) 40 MG capsule Take 40 mg by mouth daily.     [provider]  ibuprofen (ADVIL,MOTRIN) 800 MG tablet Take 800 mg by mouth every 12 (twelve) hours as needed for moderate pain. Every 12 hours prn     [provider]  Liniments (SALONPAS EX) Apply 1 each topically daily as needed (pain).     [provider]  losartan (COZAAR) 100 MG tablet Take 100 mg by mouth daily.    [provider]  metoprolol succinate (TOPROL-XL) 25 MG 24 hr tablet Take 25 mg by mouth daily.  12/25/14   [provider]  nitroGLYCERIN (NITROSTAT) 0.4 MG SL tablet Place 1 tablet (0.4 mg total) under the tongue every 5 (five) minutes as needed for chest pain. Patient not taking: Reported on 03/09/2017 10/17/13   Reyne Dumas, MD  predniSONE (STERAPRED UNI-PAK 21 TAB) 10 MG (21) TBPK tablet 6 tabs for 1 day, then 5 tabs for 1 das, then 4 tabs for 1 day, then 3 tabs for 1 day, 2 tabs for 1 day, then 1 tab for 1 day 10/01/17   Orvan July, NP    Family History Family History  Problem Relation Age of Onset  . Diabetes Maternal Grandmother   . Stroke Maternal Grandmother   . Kidney disease Maternal Grandmother   . Ovarian cancer Paternal Grandmother        dx. late 30s-early 6s  . Prostate cancer Paternal Grandfather   . Ovarian cancer Paternal Aunt        (x3) paternal aunts dx. ages 57s-late 70s  . Breast cancer Maternal Aunt        dx. 67s  . Hypertension Mother   . Heart disease Mother   . Goiter Mother   . Other Mother        breast lumpectomy in early 52s, was in the hospital for 5 days after  . Heart disease Father   . Prostate cancer Father 33       s/p orchiectomy  . Stroke Father   . Ulcers Brother   . Other Brother        elevated PSA level w/ surveillance  . Colon polyps Brother        less than 10  . Pyelonephritis Daughter   . Breast cancer Sister 99       left breast ca - poorly differentiated carcinoma with squamous metaplasia; triple neg; has since had BL mastectomies  . Colon polyps Sister        less than 10  . Congestive Heart Failure Sister   . Prostate cancer Paternal Uncle        (x5) paternal uncles dx. prostate cancer in  their 55s  . Prostate cancer Maternal Grandfather        d. late 56s with mets to colon  . Other Other 39       NOS benign  brain tumor; +fluid  . Prostate cancer Cousin 59       paternal 1st cousin   . Prostate cancer Cousin        paternal 1st cousin, once-removed dx. 68s  . Prostate cancer Cousin        (x2) paternal 1st cousins, once-removed, dx. early 64s  . Lung cancer Brother 25       paternal half-brother; smoker  . Uterine cancer Maternal Aunt        dx. 30s  . Breast cancer Maternal Aunt        dx. bilateral breast cancers - 19s, 27s  . Liver cancer Maternal Aunt        +hepatitis C; d. 32s  . Congestive Heart Failure Maternal Uncle 68  . Stroke Maternal Uncle        d. 61s; (x2 maternal uncles)  . Cancer Cousin 52       sinus cancer dx. 38s (maternal 1st cousin)  . Lung cancer Cousin        maternal 1st cousin dx. 60s/smoker; maternal 1st cousin  . Pancreatic cancer Cousin        maternal 1st cousin dx. late 32s  . Prostate cancer Cousin        maternal 1st cousin dx. 2  . Breast cancer Cousin        (x4) maternal 1st cousins dx. late 72s, 80s    Social History Social History   Tobacco Use  . Smoking status: Never Smoker  . Smokeless tobacco: Never Used  Substance Use Topics  . Alcohol use: No    Alcohol/week: 0.0 standard drinks  . Drug use: No     Allergies   Crab [shellfish allergy]; Ivp dye [iodinated diagnostic agents]; Nizatidine; Dilaudid [hydromorphone hcl]; Hydrocodone-acetaminophen; and Oxycodone-acetaminophen   Review of Systems Review of Systems  Respiratory: Positive for cough.      Physical Exam Triage Vital Signs ED Triage Vitals  Enc Vitals Group     BP 10/01/17 0855 (!) 120/107     Pulse Rate 10/01/17 0855 68     Resp 10/01/17 0855 (!) 22     Temp 10/01/17 0855 98 F (36.7 C)     Temp src --      SpO2 10/01/17 0855 96 %     Weight --      Height --      Head Circumference --      Peak Flow --      Pain Score 10/01/17 0854 5     Pain Loc --      Pain Edu? --      Excl. in [place]? --    No data found.  Updated Vital Signs BP (!) 120/107    Pulse 68   Temp 98 F (36.7 C)   Resp (!) 22   LMP 07/05/2009   SpO2 96%   Visual Acuity Right Eye Distance:   Left Eye Distance:   Bilateral Distance:    Right Eye Near:   Left Eye Near:    Bilateral Near:     Physical Exam  Constitutional: She is oriented to person, place, and time. She appears well-developed and well-nourished.  Appears ill   HENT:  Head: Normocephalic and atraumatic.  Bilateral TMs normal.  External  ears normal.  Without posterior oropharyngeal erythema, tonsillar swelling or exudates. No lesions.  No lymphadenopathy.     Eyes: Conjunctivae are normal.  Neck: Normal range of motion.  Cardiovascular: Normal rate and regular rhythm.  Pulmonary/Chest:  Mild dyspnea with coarse lung sounds throughout lung fields.  Expiratory wheezing in left upper lobe.   Musculoskeletal: Normal range of motion.  Neurological: She is alert and oriented to person, place, and time.  Skin: Skin is warm and dry.  Psychiatric: She has a normal mood and affect.  Nursing note and vitals reviewed.    UC Treatments / Results  Labs (all labs ordered are listed, but only abnormal results are displayed) Labs Reviewed - No data to display  EKG None  Radiology Dg Chest 2 View  Result Date: 10/01/2017 CLINICAL DATA:  Cough, shortness of breath EXAM: CHEST - 2 VIEW COMPARISON:  10/17/2013 FINDINGS: Heart is borderline in size. No confluent airspace opacities or effusions. No acute bony abnormality. IMPRESSION: Borderline heart size.  No active disease. Electronically Signed   By: Rolm Baptise M.D.   On: 10/01/2017 09:34    Procedures Procedures (including critical care time)  Medications Ordered in UC Medications  ipratropium-albuterol (DUONEB) 0.5-2.5 (3) MG/3ML nebulizer solution 3 mL (3 mLs Nebulization Given 10/01/17 0928)    Initial Impression / Assessment and Plan / UC Course  I have reviewed the triage vital signs and the nursing notes.  Pertinent labs &  imaging results that were available during my care of the patient were reviewed by me and considered in my medical decision making (see chart for details).     DuoNeb in clinic Patient feeling better after treatment We will go ahead and treat for bronchitis Medication sent to the pharmacy Follow up as needed for continued or worsening symptoms  Final Clinical Impressions(s) / UC Diagnoses   Final diagnoses:  Bronchitis     Discharge Instructions     It was nice meeting you!!  We are treating you for bronchitis Medications sent to the pharmacy Follow up as needed for continued or worsening symptoms     ED Prescriptions    Medication Sig Dispense Auth. Provider   predniSONE (STERAPRED UNI-PAK 21 TAB) 10 MG (21) TBPK tablet 6 tabs for 1 day, then 5 tabs for 1 das, then 4 tabs for 1 day, then 3 tabs for 1 day, 2 tabs for 1 day, then 1 tab for 1 day 21 tablet Juancarlos Crescenzo A, NP   azithromycin (ZITHROMAX) 250 MG tablet Take 1 tablet (250 mg total) by mouth daily. Take first 2 tablets together, then 1 every day until finished. 6 tablet Legion Discher A, NP   albuterol (PROVENTIL HFA;VENTOLIN HFA) 108 (90 Base) MCG/ACT inhaler Inhale 1-2 puffs into the lungs every 6 (six) hours as needed for wheezing or shortness of breath. 1 Inhaler Loura Halt A, NP     Controlled Substance Prescriptions [place] Controlled Substance Registry consulted? Not Applicable   Orvan July, NP 10/01/17 [id number]    Wynona Luna, MD 10/12/17 1540

## 2017-10-01 NOTE — Discharge Instructions (Signed)
It was nice meeting you!!  We are treating you for bronchitis Medications sent to the pharmacy Follow up as needed for continued or worsening symptoms

## 2017-10-01 NOTE — ED Triage Notes (Signed)
Pt presents with complaints of cough and congestion x 1 week. Reports being seen at her doctors office and has been on Bactrim and has also been on cough medication. Reports worsening productive cough and pain in her ribs with cough.

## 2017-10-08 DIAGNOSIS — G4733 Obstructive sleep apnea (adult) (pediatric): Secondary | ICD-10-CM | POA: Diagnosis not present

## 2017-10-13 MED FILL — CloNIDine HCL 0.1 MG TAB: 0.1 | 90 days supply | Qty: 270 | Fill #1

## 2017-10-13 MED FILL — DICLOFENAC SODIUM 1% GEL: 1 | 30 days supply | Qty: 100 | Fill #1

## 2017-11-01 DIAGNOSIS — Z91013 Allergy to seafood: Secondary | ICD-10-CM | POA: Diagnosis not present

## 2017-11-01 DIAGNOSIS — Z833 Family history of diabetes mellitus: Secondary | ICD-10-CM | POA: Diagnosis not present

## 2017-11-01 DIAGNOSIS — E785 Hyperlipidemia, unspecified: Secondary | ICD-10-CM | POA: Diagnosis not present

## 2017-11-01 DIAGNOSIS — I10 Essential (primary) hypertension: Secondary | ICD-10-CM | POA: Diagnosis not present

## 2017-11-01 DIAGNOSIS — G4733 Obstructive sleep apnea (adult) (pediatric): Secondary | ICD-10-CM | POA: Diagnosis not present

## 2017-11-01 DIAGNOSIS — M25579 Pain in unspecified ankle and joints of unspecified foot: Secondary | ICD-10-CM | POA: Diagnosis not present

## 2017-11-01 DIAGNOSIS — Z0001 Encounter for general adult medical examination with abnormal findings: Secondary | ICD-10-CM | POA: Diagnosis not present

## 2017-11-01 DIAGNOSIS — N281 Cyst of kidney, acquired: Secondary | ICD-10-CM | POA: Diagnosis not present

## 2017-11-01 DIAGNOSIS — F419 Anxiety disorder, unspecified: Secondary | ICD-10-CM | POA: Diagnosis not present

## 2017-11-01 DIAGNOSIS — Z79899 Other long term (current) drug therapy: Secondary | ICD-10-CM | POA: Diagnosis not present

## 2017-11-01 MED FILL — EPINEPHRINE 0.3 MG AUTO-INJ: 0.3 | 2 days supply | Qty: 2 | Fill #0

## 2017-11-10 DIAGNOSIS — G4733 Obstructive sleep apnea (adult) (pediatric): Secondary | ICD-10-CM | POA: Diagnosis not present

## 2017-11-10 DIAGNOSIS — G4700 Insomnia, unspecified: Secondary | ICD-10-CM | POA: Diagnosis not present

## 2017-11-18 DIAGNOSIS — I10 Essential (primary) hypertension: Secondary | ICD-10-CM | POA: Diagnosis not present

## 2017-11-18 DIAGNOSIS — R079 Chest pain, unspecified: Secondary | ICD-10-CM | POA: Diagnosis not present

## 2017-11-18 MED FILL — cloNIDine HCL 0.2 MG TABS: 0.2 | 30 days supply | Qty: 90 | Fill #0

## 2017-11-18 MED FILL — EDARBYCLOR 40-12.5 MG TAB: 40-12.5 | 30 days supply | Qty: 30 | Fill #0

## 2017-11-19 ENCOUNTER — Emergency Department (HOSPITAL_COMMUNITY): Payer: 59

## 2017-11-19 ENCOUNTER — Emergency Department (HOSPITAL_COMMUNITY)
Admission: EM | Admit: 2017-11-19 | Discharge: 2017-11-19 | Disposition: A | Discharge status: Home / Self Care | Payer: 59 | Attending: Emergency Medicine | Admitting: Emergency Medicine

## 2017-11-19 ENCOUNTER — Encounter (HOSPITAL_COMMUNITY): Payer: Self-pay | Admitting: Emergency Medicine

## 2017-11-19 DIAGNOSIS — G4489 Other headache syndrome: Secondary | ICD-10-CM | POA: Diagnosis not present

## 2017-11-19 DIAGNOSIS — I10 Essential (primary) hypertension: Secondary | ICD-10-CM | POA: Insufficient documentation

## 2017-11-19 DIAGNOSIS — R0902 Hypoxemia: Secondary | ICD-10-CM | POA: Diagnosis not present

## 2017-11-19 DIAGNOSIS — R202 Paresthesia of skin: Secondary | ICD-10-CM | POA: Diagnosis not present

## 2017-11-19 DIAGNOSIS — R0602 Shortness of breath: Secondary | ICD-10-CM | POA: Diagnosis not present

## 2017-11-19 DIAGNOSIS — H538 Other visual disturbances: Secondary | ICD-10-CM | POA: Insufficient documentation

## 2017-11-19 DIAGNOSIS — R42 Dizziness and giddiness: Secondary | ICD-10-CM | POA: Diagnosis not present

## 2017-11-19 DIAGNOSIS — Z79899 Other long term (current) drug therapy: Secondary | ICD-10-CM | POA: Insufficient documentation

## 2017-11-19 LAB — CBC WITH DIFFERENTIAL/PLATELET
ABS IMMATURE GRANULOCYTES: 0.02 10*3/uL (ref 0.00–0.07)
Basophils Absolute: 0.1 10*3/uL (ref 0.0–0.1)
Basophils Relative: 1 %
EOS PCT: 3 %
Eosinophils Absolute: 0.3 10*3/uL (ref 0.0–0.5)
HEMATOCRIT: 43.5 % (ref 36.0–46.0)
HEMOGLOBIN: 14.1 g/dL (ref 12.0–15.0)
Immature Granulocytes: 0 %
LYMPHS ABS: 2.7 10*3/uL (ref 0.7–4.0)
LYMPHS PCT: 29 %
MCH: 28.1 pg (ref 26.0–34.0)
MCHC: 32.4 g/dL (ref 30.0–36.0)
MCV: 86.7 fL (ref 80.0–100.0)
MONO ABS: 0.7 10*3/uL (ref 0.1–1.0)
MONOS PCT: 8 %
NEUTROS ABS: 5.6 10*3/uL (ref 1.7–7.7)
Neutrophils Relative %: 59 %
Platelets: 227 10*3/uL (ref 150–400)
RBC: 5.02 MIL/uL (ref 3.87–5.11)
RDW: 14.6 % (ref 11.5–15.5)
WBC: 9.4 10*3/uL (ref 4.0–10.5)
nRBC: 0 % (ref 0.0–0.2)

## 2017-11-19 LAB — COMPREHENSIVE METABOLIC PANEL
ALK PHOS: 93 U/L (ref 38–126)
ALT: 26 U/L (ref 0–44)
AST: 22 U/L (ref 15–41)
Albumin: 3.8 g/dL (ref 3.5–5.0)
Anion gap: 8 (ref 5–15)
BILIRUBIN TOTAL: 0.6 mg/dL (ref 0.3–1.2)
BUN: 16 mg/dL (ref 8–23)
CO2: 23 mmol/L (ref 22–32)
CREATININE: 1.23 mg/dL — AB (ref 0.44–1.00)
Calcium: 9.3 mg/dL (ref 8.9–10.3)
Chloride: 110 mmol/L (ref 98–111)
GFR calc non Af Amer: 46 mL/min — ABNORMAL LOW (ref >60)
GFR, EST AFRICAN AMERICAN: 54 mL/min — AB (ref >60)
GLUCOSE: 93 mg/dL (ref 70–99)
Potassium: 4.4 mmol/L (ref 3.5–5.1)
SODIUM: 141 mmol/L (ref 135–145)
TOTAL PROTEIN: 7.3 g/dL (ref 6.5–8.1)

## 2017-11-19 LAB — I-STAT TROPONIN, ED: Troponin i, poc: 0 ng/mL (ref 0.00–0.08)

## 2017-11-19 MED ORDER — ALPRAZOLAM 0.25 MG PO TABS
0.5000 mg | ORAL_TABLET | Freq: Once | ORAL | Status: AC
  Administered 2017-11-19: 0.5 mg via ORAL
  Filled 2017-11-19: qty 2

## 2017-11-19 MED ORDER — CLONIDINE HCL 0.1 MG PO TABS
0.1000 mg | ORAL_TABLET | Freq: Once | ORAL | Status: AC
  Administered 2017-11-19: 0.1 mg via ORAL
  Filled 2017-11-19: qty 1

## 2017-11-19 MED ORDER — LORAZEPAM 2 MG/ML IJ SOLN
2.0000 mg | Freq: Once | INTRAMUSCULAR | Status: AC
  Administered 2017-11-19: 1 mg via INTRAVENOUS
  Filled 2017-11-19: qty 1

## 2017-11-19 NOTE — Discharge Instructions (Signed)
Take your blood pressure medicines as prescribed.   See your doctor in a week to recheck your blood pressure.   Return to ER if you have worse headaches, dizziness, blurry vision.

## 2017-11-19 NOTE — ED Triage Notes (Signed)
Per EMS- pt was driving when she driving her vision became blurry and then had a headache. Her vision is now sensitive to light. She is hypertensive in 200s/100s. She took her new medication this morning.

## 2017-11-19 NOTE — ED Provider Notes (Signed)
[facility] DEPARTMENT Provider Note   CSN: [id number] Arrival date & time: 11/19/17  0945     History   Chief Complaint Chief Complaint  Patient presents with  . Headache  . Dizziness  . Hypertension    HPI [name] is a 62 y.o. female history of hypertension, reflux, here presenting with headache, blurry vision, here presenting with blurry vision, headache, hypertension.  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  She states that while she was driving, she has not had an onset of bilateral blurry vision as well as left-sided headaches.  No associated weakness or numbness or trouble speaking.  She went to the fire department her blood pressure was 250/140 and she was sent here for evaluation.  Patient states that she has long-standing history of hypertension.  She saw her doctor at [facility] yesterday and her blood pressure was 200/100.  She states that her losartan was changed to another medicine and her doctor doubled her clonidine to 0.2 mg from 0.1 mg. She did take her new BP meds this morning. However, she only took 0.1 mg of clonidine today. She denies hx of cerebral aneurysms. Had some palpitations and SOB when she had the episode. No chest pain.   The history is provided by the patient.    Past Medical History:  Diagnosis Date  . Abnormal Pap smear of cervix   . Anemia    as a child  . Anxiety   . Bulging lumbar disc   . GERD (gastroesophageal reflux disease)   . Hypertension   . Lactose intolerance   . MVP (mitral valve prolapse)   . Pyelonephritis   . Renal disorder   . Sleep apnea   . Spinal stenosis     Patient Active Problem List   Diagnosis Date Noted  . Family history of breast cancer in female 04/12/2015  . Family history of ovarian cancer 04/12/2015  . Cervical radiculopathy 04/06/2014  . Carpal tunnel syndrome of left wrist 04/06/2014  . Left arm pain 10/27/2013  . Cervical disc disorder with  radiculopathy of cervical region 10/27/2013  . Ischemic chest pain ([place]) 10/17/2013  . Chest pain 10/17/2013  . Obstructive sleep apnea 06/28/2013  . Dizziness 06/02/2013  . Headache(784.0) 06/02/2013  . Drowsiness 06/02/2013  . Neck pain 06/02/2013  . ABNORMALITY OF GAIT 03/15/2009  . ANKLE PAIN, BILATERAL 03/14/2009  . Pain in limb 03/14/2009    Past Surgical History:  Procedure Laterality Date  . ankle fracture  10/09   bilateral-right was surgically repaired  . ANKLE SURGERY  10/10   screws removed  . ANKLE SURGERY  2/12   removal of all hardware right ankle  . APPENDECTOMY    . arm surgery     plate in left arm  . BREAST LUMPECTOMY     right breast  . CHOLECYSTECTOMY    . DILATION AND CURETTAGE OF UTERUS       OB History    Gravida  4   Para  2   Term      Preterm      AB  2   Living  2     SAB  2   TAB      Ectopic      Multiple      Live Births           Obstetric Comments  1 stepchild  Home Medications    Prior to Admission medications   Medication Sig Start Date End Date Taking? Authorizing Provider  albuterol (PROVENTIL HFA;VENTOLIN HFA) 108 (90 Base) MCG/ACT inhaler Inhale 1-2 puffs into the lungs every 6 (six) hours as needed for wheezing or shortness of breath. 10/01/17  Yes Bast, Traci A, NP  Calcium-Magnesium-Vitamin D (CALCIUM 1200+D3) 600-40-500 MG-MG-UNIT TB24 Take 3 tablets by mouth daily.   Yes [provider]  Cimetidine (TAGAMET HB PO) Take 1 tablet by mouth as needed (hives).    Yes [provider]  cloNIDine (CATAPRES) 0.2 MG tablet Take 0.2 mg by mouth 3 (three) times daily.    Yes [provider]  diclofenac sodium (VOLTAREN) 1 % GEL Apply 2 g topically 3 (three) times daily. To affected joint 10/13/17  Yes [provider]  diphenhydrAMINE (BENADRYL) 25 mg capsule Take 25 mg by mouth every 6 (six) hours as needed.   Yes [provider]  EDARBYCLOR 40-12.5 MG TABS Take 1  tablet by mouth daily. 11/18/17  Yes [provider]  esomeprazole (NEXIUM) 40 MG capsule Take 40 mg by mouth daily.    Yes [provider]  ibuprofen (ADVIL,MOTRIN) 800 MG tablet Take 800 mg by mouth every 12 (twelve) hours as needed for moderate pain. Every 12 hours prn    Yes [provider]  Liniments (SALONPAS EX) Apply 1 each topically daily as needed (pain).   Yes [provider]  losartan (COZAAR) 100 MG tablet Take 100 mg by mouth daily.   Yes [provider]  metoprolol succinate (TOPROL-XL) 25 MG 24 hr tablet Take 25 mg by mouth daily.  12/25/14  Yes [provider]  nitroGLYCERIN (NITROSTAT) 0.4 MG SL tablet Place 1 tablet (0.4 mg total) under the tongue every 5 (five) minutes as needed for chest pain. 10/17/13  Yes Reyne Dumas, MD  amLODipine (NORVASC) 5 MG tablet Take 5 mg by mouth daily.    [provider]  azithromycin (ZITHROMAX) 250 MG tablet Take 1 tablet (250 mg total) by mouth daily. Take first 2 tablets together, then 1 every day until finished. Patient not taking: Reported on 11/19/2017 10/01/17   Loura Halt A, NP  predniSONE (STERAPRED UNI-PAK 21 TAB) 10 MG (21) TBPK tablet 6 tabs for 1 day, then 5 tabs for 1 das, then 4 tabs for 1 day, then 3 tabs for 1 day, 2 tabs for 1 day, then 1 tab for 1 day Patient not taking: Reported on 11/19/2017 10/01/17   Orvan July, NP    Family History Family History  Problem Relation Age of Onset  . Diabetes Maternal Grandmother   . Stroke Maternal Grandmother   . Kidney disease Maternal Grandmother   . Ovarian cancer Paternal Grandmother        dx. late 30s-early 27s  . Prostate cancer Paternal Grandfather   . Ovarian cancer Paternal Aunt        (x3) paternal aunts dx. ages 27s-late 70s  . Breast cancer Maternal Aunt        dx. 9s  . Hypertension Mother   . Heart disease Mother   . Goiter Mother   . Other Mother        breast lumpectomy in early 59s, was in the  hospital for 5 days after  . Heart disease Father   . Prostate cancer Father 79       s/p orchiectomy  . Stroke Father   . Ulcers Brother   .  Other Brother        elevated PSA level w/ surveillance  . Colon polyps Brother        less than 10  . Pyelonephritis Daughter   . Breast cancer Sister 47       left breast ca - poorly differentiated carcinoma with squamous metaplasia; triple neg; has since had BL mastectomies  . Colon polyps Sister        less than 10  . Congestive Heart Failure Sister   . Prostate cancer Paternal Uncle        (x5) paternal uncles dx. prostate cancer in their 42s  . Prostate cancer Maternal Grandfather        d. late 59s with mets to colon  . Other Other 39       NOS benign brain tumor; +fluid  . Prostate cancer Cousin 10       paternal 1st cousin   . Prostate cancer Cousin        paternal 1st cousin, once-removed dx. 54s  . Prostate cancer Cousin        (x2) paternal 1st cousins, once-removed, dx. early 47s  . Lung cancer Brother 20       paternal half-brother; smoker  . Uterine cancer Maternal Aunt        dx. 30s  . Breast cancer Maternal Aunt        dx. bilateral breast cancers - 41s, 80s  . Liver cancer Maternal Aunt        +hepatitis C; d. 38s  . Congestive Heart Failure Maternal Uncle 68  . Stroke Maternal Uncle        d. 42s; (x2 maternal uncles)  . Cancer Cousin 52       sinus cancer dx. 79s (maternal 1st cousin)  . Lung cancer Cousin        maternal 1st cousin dx. 60s/smoker; maternal 1st cousin  . Pancreatic cancer Cousin        maternal 1st cousin dx. late 40s  . Prostate cancer Cousin        maternal 1st cousin dx. 71  . Breast cancer Cousin        (x4) maternal 1st cousins dx. late 59s, 73s    Social History Social History   Tobacco Use  . Smoking status: Never Smoker  . Smokeless tobacco: Never Used  Substance Use Topics  . Alcohol use: No    Alcohol/week: 0.0 standard drinks  . Drug use: No     Allergies   Crab  [shellfish allergy]; Ivp dye [iodinated diagnostic agents]; Nizatidine; Dilaudid [hydromorphone hcl]; Hydrocodone-acetaminophen; and Oxycodone-acetaminophen   Review of Systems Review of Systems  Neurological: Positive for dizziness and headaches.  All other systems reviewed and are negative.    Physical Exam Updated Vital Signs BP 136/80   Pulse (!) 58   Temp 97.8 F (36.6 C) (Oral)   Resp 18   LMP 07/05/2009   SpO2 94%   Physical Exam  Constitutional: She is oriented to person, place, and time. She appears well-nourished.  Slightly anxious, well appearing   HENT:  Head: Normocephalic.  Mouth/Throat: Oropharynx is clear and moist.  Eyes: Pupils are equal, round, and reactive to light. EOM are normal.  No obvious nystagmus, no visual field cut   Neck: Normal range of motion. Neck supple.  Cardiovascular: Normal rate, regular rhythm and normal heart sounds.  Pulmonary/Chest: Effort normal and breath sounds normal.  Abdominal: Soft. Bowel sounds are normal.  Musculoskeletal: Normal  range of motion.  Neurological: She is alert and oriented to person, place, and time.  CN 2- 12 intact. Nl strength throughout. Nl finger to nose, nl gait.   Skin: Skin is warm. Capillary refill takes less than 2 seconds.  Psychiatric: She has a normal mood and affect. Her behavior is normal.  Nursing note and vitals reviewed.    ED Treatments / Results  Labs (all labs ordered are listed, but only abnormal results are displayed) Labs Reviewed  COMPREHENSIVE METABOLIC PANEL - Abnormal; Notable for the following components:      Result Value   Creatinine, Ser 1.23 (*)    GFR calc non Af Amer 46 (*)    GFR calc Af Amer 54 (*)    All other components within normal limits  CBC WITH DIFFERENTIAL/PLATELET  I-STAT TROPONIN, ED    EKG EKG Interpretation  Date/Time:  Friday November 19 2017 09:48:22 EST Ventricular Rate:  75 PR Interval:    QRS Duration: 85 QT Interval:  408 QTC  Calculation: 456 R Axis:   41 Text Interpretation:  Sinus rhythm Probable left atrial enlargement No significant change since last tracing Confirmed by Wandra Arthurs [id number]) on 11/19/2017 9:58:52 AM   Radiology Dg Chest 2 View  Result Date: 11/19/2017 CLINICAL DATA:  Pt began a new BP drug this morning and shortly after began having SOB, dizziness, blurred vision, increased BP - hx of htn, GERD, MVP, sleep apnea EXAM: CHEST - 2 VIEW COMPARISON:  10/01/2017 FINDINGS: Heart size is accentuated by AP position of the patient. There are no focal consolidations or pleural effusions. No pulmonary edema. Mild midthoracic spondylosis. IMPRESSION: No evidence for acute cardiopulmonary abnormality. Electronically Signed   By: Nolon Nations M.D.   On: 11/19/2017 11:21   Ct Head Wo Contrast  Result Date: 11/19/2017 CLINICAL DATA:  Headache with blurred vision. Photophobia. Hypertension EXAM: CT HEAD WITHOUT CONTRAST TECHNIQUE: Contiguous axial images were obtained from the base of the skull through the vertex without intravenous contrast. COMPARISON:  Head CT Jun 01, 2013 and brain MRI June 22, 2013 FINDINGS: Brain: The ventricles are normal in size and configuration. There is no evident intracranial mass, hemorrhage, extra-axial fluid collection, or midline shift. There is mild small vessel disease in the centra semiovale bilaterally. Elsewhere brain parenchyma appears unremarkable. No acute infarct is appreciable. Vascular: No hyperdense vessel.  No evident vascular calcification. Skull: Bony calvarium appears intact. Sinuses/Orbits: There is a retention cyst in the inferior left maxillary antrum. There is mucosal thickening involving multiple ethmoid air cells. Orbits appear symmetric bilaterally. Other: Visualized mastoid air cells are clear. IMPRESSION: Mild periventricular small vessel disease. No acute infarct. No mass or hemorrhage. Areas of paranasal sinus disease noted. Electronically Signed   By:  Lowella Grip III M.D.   On: 11/19/2017 11:32    Procedures Procedures (including critical care time)  Medications Ordered in ED Medications  LORazepam (ATIVAN) injection 2 mg (1 mg Intravenous Given 11/19/17 1103)  cloNIDine (CATAPRES) tablet 0.1 mg (0.1 mg Oral Given 11/19/17 1012)  ALPRAZolam (XANAX) tablet 0.5 mg (0.5 mg Oral Given 11/19/17 1012)     Initial Impression / Assessment and Plan / ED Course  I have reviewed the triage vital signs and the nursing notes.  Pertinent labs & imaging results that were available during my care of the patient were reviewed by me and considered in my medical decision making (see chart for details).    [name] is a 62 y.o. female  here with headache, dizziness, hypertension. Likely symptomatic hypertension vs migraines. Headaches resolved currently and she doesn't want any meds and neurologically intact. Initially ordered CTA head/neck but she has IV dye allergy and symptom onset less than 6 hrs so if CT head showed no bleed, will not need LP. Will get labs, give BP meds.    11:55 AM CT head unremarkable. Labs and CXR unremarkable. BP down to 136/80 after BP meds. Patient has severe claustrophilia so was given ativan and now is sleepy. Daughter will drive her home. Told her to take her BP meds as prescribed, including the 0.2 mg of clonidine. Stable for dc.    Final Clinical Impressions(s) / ED Diagnoses   Final diagnoses:  None    ED Discharge Orders    None       Drenda Freeze, MD 11/19/17 1156

## 2017-11-21 DIAGNOSIS — I10 Essential (primary) hypertension: Secondary | ICD-10-CM | POA: Diagnosis not present

## 2017-11-22 DIAGNOSIS — I10 Essential (primary) hypertension: Secondary | ICD-10-CM | POA: Diagnosis not present

## 2017-11-22 MED FILL — LOSARTAN POTASSIUM 100 MG T: 100 | 90 days supply | Qty: 90 | Fill #0

## 2017-11-26 DIAGNOSIS — I10 Essential (primary) hypertension: Secondary | ICD-10-CM | POA: Diagnosis not present

## 2017-12-05 ENCOUNTER — Encounter (HOSPITAL_COMMUNITY): Payer: Self-pay | Admitting: Emergency Medicine

## 2017-12-05 ENCOUNTER — Inpatient Hospital Stay (HOSPITAL_COMMUNITY): Payer: 59

## 2017-12-05 ENCOUNTER — Inpatient Hospital Stay (HOSPITAL_COMMUNITY)
Admission: EM | Admit: 2017-12-05 | Discharge: 2017-12-12 | DRG: 915 | Disposition: A | Discharge status: Home / Self Care | Payer: 59 | Attending: Internal Medicine | Admitting: Internal Medicine

## 2017-12-05 ENCOUNTER — Other Ambulatory Visit: Payer: Self-pay

## 2017-12-05 DIAGNOSIS — N179 Acute kidney failure, unspecified: Secondary | ICD-10-CM | POA: Diagnosis present

## 2017-12-05 DIAGNOSIS — N183 Chronic kidney disease, stage 3 (moderate): Secondary | ICD-10-CM | POA: Diagnosis not present

## 2017-12-05 DIAGNOSIS — J988 Other specified respiratory disorders: Secondary | ICD-10-CM | POA: Diagnosis not present

## 2017-12-05 DIAGNOSIS — I341 Nonrheumatic mitral (valve) prolapse: Secondary | ICD-10-CM | POA: Diagnosis present

## 2017-12-05 DIAGNOSIS — Z91041 Radiographic dye allergy status: Secondary | ICD-10-CM

## 2017-12-05 DIAGNOSIS — R1310 Dysphagia, unspecified: Secondary | ICD-10-CM | POA: Diagnosis present

## 2017-12-05 DIAGNOSIS — I10 Essential (primary) hypertension: Secondary | ICD-10-CM | POA: Diagnosis not present

## 2017-12-05 DIAGNOSIS — I129 Hypertensive chronic kidney disease with stage 1 through stage 4 chronic kidney disease, or unspecified chronic kidney disease: Secondary | ICD-10-CM | POA: Diagnosis present

## 2017-12-05 DIAGNOSIS — Z885 Allergy status to narcotic agent status: Secondary | ICD-10-CM

## 2017-12-05 DIAGNOSIS — T783XXD Angioneurotic edema, subsequent encounter: Secondary | ICD-10-CM | POA: Diagnosis not present

## 2017-12-05 DIAGNOSIS — R030 Elevated blood-pressure reading, without diagnosis of hypertension: Secondary | ICD-10-CM | POA: Diagnosis not present

## 2017-12-05 DIAGNOSIS — J9601 Acute respiratory failure with hypoxia: Secondary | ICD-10-CM | POA: Diagnosis not present

## 2017-12-05 DIAGNOSIS — R001 Bradycardia, unspecified: Secondary | ICD-10-CM | POA: Diagnosis not present

## 2017-12-05 DIAGNOSIS — N10 Acute tubulo-interstitial nephritis: Secondary | ICD-10-CM | POA: Diagnosis not present

## 2017-12-05 DIAGNOSIS — T380X5A Adverse effect of glucocorticoids and synthetic analogues, initial encounter: Secondary | ICD-10-CM | POA: Diagnosis not present

## 2017-12-05 DIAGNOSIS — D72829 Elevated white blood cell count, unspecified: Secondary | ICD-10-CM | POA: Diagnosis not present

## 2017-12-05 DIAGNOSIS — R471 Dysarthria and anarthria: Secondary | ICD-10-CM | POA: Diagnosis present

## 2017-12-05 DIAGNOSIS — K219 Gastro-esophageal reflux disease without esophagitis: Secondary | ICD-10-CM | POA: Diagnosis present

## 2017-12-05 DIAGNOSIS — E739 Lactose intolerance, unspecified: Secondary | ICD-10-CM | POA: Diagnosis present

## 2017-12-05 DIAGNOSIS — E785 Hyperlipidemia, unspecified: Secondary | ICD-10-CM | POA: Diagnosis present

## 2017-12-05 DIAGNOSIS — E876 Hypokalemia: Secondary | ICD-10-CM | POA: Diagnosis present

## 2017-12-05 DIAGNOSIS — Z6835 Body mass index (BMI) 35.0-35.9, adult: Secondary | ICD-10-CM | POA: Diagnosis not present

## 2017-12-05 DIAGNOSIS — J9690 Respiratory failure, unspecified, unspecified whether with hypoxia or hypercapnia: Secondary | ICD-10-CM | POA: Diagnosis not present

## 2017-12-05 DIAGNOSIS — T502X5A Adverse effect of carbonic-anhydrase inhibitors, benzothiadiazides and other diuretics, initial encounter: Secondary | ICD-10-CM | POA: Diagnosis present

## 2017-12-05 DIAGNOSIS — R000 Tachycardia, unspecified: Secondary | ICD-10-CM | POA: Diagnosis not present

## 2017-12-05 DIAGNOSIS — N281 Cyst of kidney, acquired: Secondary | ICD-10-CM | POA: Diagnosis not present

## 2017-12-05 DIAGNOSIS — Z79899 Other long term (current) drug therapy: Secondary | ICD-10-CM | POA: Diagnosis not present

## 2017-12-05 DIAGNOSIS — Z888 Allergy status to other drugs, medicaments and biological substances status: Secondary | ICD-10-CM

## 2017-12-05 DIAGNOSIS — Z91013 Allergy to seafood: Secondary | ICD-10-CM

## 2017-12-05 DIAGNOSIS — R9431 Abnormal electrocardiogram [ECG] [EKG]: Secondary | ICD-10-CM | POA: Diagnosis not present

## 2017-12-05 DIAGNOSIS — T783XXA Angioneurotic edema, initial encounter: Secondary | ICD-10-CM | POA: Diagnosis not present

## 2017-12-05 DIAGNOSIS — E669 Obesity, unspecified: Secondary | ICD-10-CM | POA: Diagnosis present

## 2017-12-05 LAB — COMPREHENSIVE METABOLIC PANEL
ALT: 35 U/L (ref 0–44)
AST: 34 U/L (ref 15–41)
Albumin: 3.8 g/dL (ref 3.5–5.0)
Alkaline Phosphatase: 105 U/L (ref 38–126)
Anion gap: 12 (ref 5–15)
BUN: 16 mg/dL (ref 8–23)
CO2: 23 mmol/L (ref 22–32)
Calcium: 9 mg/dL (ref 8.9–10.3)
Chloride: 102 mmol/L (ref 98–111)
Creatinine, Ser: 1.25 mg/dL — ABNORMAL HIGH (ref 0.44–1.00)
GFR calc non Af Amer: 46 mL/min — ABNORMAL LOW (ref >60)
GFR, EST AFRICAN AMERICAN: 54 mL/min — AB (ref >60)
Glucose, Bld: 118 mg/dL — ABNORMAL HIGH (ref 70–99)
Potassium: 2.7 mmol/L — CL (ref 3.5–5.1)
Sodium: 137 mmol/L (ref 135–145)
Total Bilirubin: 0.8 mg/dL (ref 0.3–1.2)
Total Protein: 7.2 g/dL (ref 6.5–8.1)

## 2017-12-05 LAB — I-STAT ARTERIAL BLOOD GAS, ED
Acid-Base Excess: 2 mmol/L (ref 0.0–2.0)
Bicarbonate: 28.2 mmol/L — ABNORMAL HIGH (ref 20.0–28.0)
O2 Saturation: 100 %
PCO2 ART: 46.9 mmHg (ref 32.0–48.0)
TCO2: 30 mmol/L (ref 22–32)
pH, Arterial: 7.387 (ref 7.350–7.450)
pO2, Arterial: 246 mmHg — ABNORMAL HIGH (ref 83.0–108.0)

## 2017-12-05 LAB — TRIGLYCERIDES: TRIGLYCERIDES: 309 mg/dL — AB (ref <150)

## 2017-12-05 LAB — CBC WITH DIFFERENTIAL/PLATELET
ABS IMMATURE GRANULOCYTES: 0.05 10*3/uL (ref 0.00–0.07)
Basophils Absolute: 0.1 10*3/uL (ref 0.0–0.1)
Basophils Relative: 1 %
Eosinophils Absolute: 0.4 10*3/uL (ref 0.0–0.5)
Eosinophils Relative: 3 %
HCT: 43.8 % (ref 36.0–46.0)
Hemoglobin: 14.1 g/dL (ref 12.0–15.0)
IMMATURE GRANULOCYTES: 0 %
Lymphocytes Relative: 28 %
Lymphs Abs: 3.6 10*3/uL (ref 0.7–4.0)
MCH: 27.4 pg (ref 26.0–34.0)
MCHC: 32.2 g/dL (ref 30.0–36.0)
MCV: 85.2 fL (ref 80.0–100.0)
MONO ABS: 1.2 10*3/uL — AB (ref 0.1–1.0)
Monocytes Relative: 9 %
NEUTROS ABS: 7.8 10*3/uL — AB (ref 1.7–7.7)
NEUTROS PCT: 59 %
Platelets: 333 10*3/uL (ref 150–400)
RBC: 5.14 MIL/uL — AB (ref 3.87–5.11)
RDW: 13.8 % (ref 11.5–15.5)
WBC: 13.2 10*3/uL — ABNORMAL HIGH (ref 4.0–10.5)
nRBC: 0 % (ref 0.0–0.2)

## 2017-12-05 LAB — TROPONIN I
Troponin I: <0.03 ng/mL (ref <0.03)
Troponin I: 0.06 ng/mL (ref <0.03)

## 2017-12-05 LAB — LACTIC ACID, PLASMA
LACTIC ACID, VENOUS: 1.7 mmol/L (ref 0.5–1.9)
Lactic Acid, Venous: 2.2 mmol/L (ref 0.5–1.9)

## 2017-12-05 LAB — PHOSPHORUS: Phosphorus: 2.1 mg/dL — ABNORMAL LOW (ref 2.5–4.6)

## 2017-12-05 LAB — MRSA PCR SCREENING: MRSA by PCR: NEGATIVE

## 2017-12-05 LAB — PROCALCITONIN: Procalcitonin: <0.1 ng/mL

## 2017-12-05 LAB — MAGNESIUM: Magnesium: 1.9 mg/dL (ref 1.7–2.4)

## 2017-12-05 MED ORDER — FENTANYL CITRATE (PF) 100 MCG/2ML IJ SOLN
INTRAMUSCULAR | Status: AC
  Filled 2017-12-05: qty 2

## 2017-12-05 MED ORDER — FENTANYL CITRATE (PF) 100 MCG/2ML IJ SOLN: 50.0000 ug | INTRAMUSCULAR | Status: DC | PRN

## 2017-12-05 MED ORDER — HEPARIN SODIUM (PORCINE) 5000 UNIT/ML IJ SOLN
5000.0000 [IU] | Freq: Three times a day (TID) | INTRAMUSCULAR | Status: DC
  Administered 2017-12-05 – 2017-12-12 (×17): 5000 [IU] via SUBCUTANEOUS
  Filled 2017-12-05 (×19): qty 1

## 2017-12-05 MED ORDER — POTASSIUM CHLORIDE 10 MEQ/100ML IV SOLN
INTRAVENOUS | Status: AC
  Filled 2017-12-05: qty 100

## 2017-12-05 MED ORDER — PROPOFOL 1000 MG/100ML IV EMUL
INTRAVENOUS | Status: AC
  Administered 2017-12-05: 10 ug/kg/min via INTRAVENOUS
  Filled 2017-12-05: qty 100

## 2017-12-05 MED ORDER — ROCURONIUM BROMIDE 50 MG/5ML IV SOLN
INTRAVENOUS | Status: AC | PRN
  Administered 2017-12-05: 50 mg via INTRAVENOUS

## 2017-12-05 MED ORDER — POTASSIUM CHLORIDE 10 MEQ/100ML IV SOLN
10.0000 meq | INTRAVENOUS | Status: AC
  Administered 2017-12-05 (×4): 10 meq via INTRAVENOUS

## 2017-12-05 MED ORDER — SODIUM CHLORIDE 0.9 % IV SOLN
INTRAVENOUS | Status: DC
  Administered 2017-12-05 – 2017-12-07 (×4): via INTRAVENOUS

## 2017-12-05 MED ORDER — MIDAZOLAM HCL 2 MG/2ML IJ SOLN
INTRAMUSCULAR | Status: AC
  Filled 2017-12-05: qty 2

## 2017-12-05 MED ORDER — CHLORHEXIDINE GLUCONATE 0.12% ORAL RINSE (MEDLINE KIT)
15.0000 mL | Freq: Two times a day (BID) | OROMUCOSAL | Status: DC
  Administered 2017-12-05 – 2017-12-06 (×2): 15 mL via OROMUCOSAL

## 2017-12-05 MED ORDER — DIPHENHYDRAMINE HCL 50 MG/ML IJ SOLN
25.0000 mg | Freq: Four times a day (QID) | INTRAMUSCULAR | Status: DC | PRN
  Administered 2017-12-06 – 2017-12-08 (×4): 25 mg via INTRAVENOUS
  Filled 2017-12-05 (×4): qty 1

## 2017-12-05 MED ORDER — SODIUM CHLORIDE 0.9 % IV SOLN
INTRAVENOUS | Status: AC | PRN
  Administered 2017-12-05: 125 mL/h via INTRAVENOUS

## 2017-12-05 MED ORDER — FENTANYL CITRATE (PF) 100 MCG/2ML IJ SOLN
50.0000 ug | INTRAMUSCULAR | Status: DC | PRN
  Administered 2017-12-05: 50 ug via INTRAVENOUS

## 2017-12-05 MED ORDER — METHYLPREDNISOLONE SODIUM SUCC 125 MG IJ SOLR
60.0000 mg | Freq: Four times a day (QID) | INTRAMUSCULAR | Status: DC
  Administered 2017-12-05 – 2017-12-07 (×6): 60 mg via INTRAVENOUS
  Administered 2017-12-07: 62.5 mg via INTRAVENOUS
  Administered 2017-12-07: 60 mg via INTRAVENOUS
  Filled 2017-12-05 (×8): qty 2

## 2017-12-05 MED ORDER — PROPOFOL 1000 MG/100ML IV EMUL
0.0000 ug/kg/min | INTRAVENOUS | Status: DC
  Administered 2017-12-05: 10 ug/kg/min via INTRAVENOUS
  Administered 2017-12-05 – 2017-12-06 (×4): 40 ug/kg/min via INTRAVENOUS
  Filled 2017-12-05 (×6): qty 100

## 2017-12-05 MED ORDER — DEXAMETHASONE SODIUM PHOSPHATE 10 MG/ML IJ SOLN
10.0000 mg | Freq: Once | INTRAMUSCULAR | Status: AC
  Administered 2017-12-05: 10 mg via INTRAVENOUS
  Filled 2017-12-05: qty 1

## 2017-12-05 MED ORDER — POTASSIUM PHOSPHATES 15 MMOLE/5ML IV SOLN
30.0000 mmol | Freq: Once | INTRAVENOUS | Status: AC
  Administered 2017-12-05: 30 mmol via INTRAVENOUS
  Filled 2017-12-05: qty 10

## 2017-12-05 MED ORDER — FENTANYL CITRATE (PF) 100 MCG/2ML IJ SOLN
INTRAMUSCULAR | Status: AC | PRN
  Administered 2017-12-05: 100 ug via INTRAVENOUS

## 2017-12-05 MED ORDER — ORAL CARE MOUTH RINSE
15.0000 mL | OROMUCOSAL | Status: DC
  Administered 2017-12-05 – 2017-12-06 (×7): 15 mL via OROMUCOSAL

## 2017-12-05 MED ORDER — PANTOPRAZOLE SODIUM 40 MG IV SOLR
40.0000 mg | Freq: Two times a day (BID) | INTRAVENOUS | Status: DC
  Administered 2017-12-05 – 2017-12-07 (×4): 40 mg via INTRAVENOUS
  Filled 2017-12-05 (×4): qty 40

## 2017-12-05 MED ORDER — ETOMIDATE 2 MG/ML IV SOLN
INTRAVENOUS | Status: AC | PRN
  Administered 2017-12-05: 20 mg via INTRAVENOUS

## 2017-12-05 MED ORDER — FAMOTIDINE IN NACL 20-0.9 MG/50ML-% IV SOLN
20.0000 mg | INTRAVENOUS | Status: DC
  Administered 2017-12-05 – 2017-12-06 (×2): 20 mg via INTRAVENOUS
  Filled 2017-12-05 (×3): qty 50

## 2017-12-05 MED ORDER — MIDAZOLAM HCL 2 MG/2ML IJ SOLN
INTRAMUSCULAR | Status: AC | PRN
  Administered 2017-12-05: 2 mg via INTRAVENOUS

## 2017-12-05 NOTE — Progress Notes (Signed)
CRITICAL VALUE ALERT  Critical Value:  Lactic 2.2 Potassium 2.7  Date & Time Notied:  12/05/17 12/45/19  Provider Notified: Yac[place]b  Orders Received/Actions taken: 4 runs of IV K

## 2017-12-05 NOTE — Progress Notes (Signed)
CRITICAL VALUE ALERT  Critical Value: Troponin 0.06  Date & Time Notied: 12/05/2017 1930  Provider Notified: Warren Lacy

## 2017-12-05 NOTE — ED Triage Notes (Signed)
Per pt: recently had BP medications adjusted.  Pt states she started taking hydrochlorothiazide and reports tongue began swelling around 0900 this AM.

## 2017-12-05 NOTE — H&P (Addendum)
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 Just started on HCTZ about 1 week prior. Admitted 12/1 w/ acute angioedema.   History of present illness    62 year old female who presented the am of 12/1 w/ acute onset of tongue swelling. She reported she had just started on HCTZ the week prior and has been on Cozaar chronically. Shortly after arrival her tongue swelling continued to worsen, becoming more dysarthric and had difficulty swallowing her own secretions. PCCM asked to admit w/ concern for progressive respiratory failure   Past Medical History  HTN, gerd,  Significant Hospital Events     Consults:    Procedures:  oett 12/1>>>  Significant Diagnostic Tests:    Micro Data:    Antimicrobials:    Interim history/subjective:  12/1 admitted w/ angioedema. Intubated due to concern for worsening airway protection   Objective   Blood pressure 130/77, pulse 91, temperature 98 F (36.7 C), temperature source Axillary, resp. rate 14, height 5' 9.5" (1.765 m), weight 108.9 kg, last menstrual period 07/05/2009, SpO2 97 %.    Vent Mode: PRVC FiO2 (%):  [40 %-100 %] 40 % Set Rate:  [16 bmp] 16 bmp Vt Set:  [550 mL] 550 mL PEEP:  [5 cmH20] 5 cmH20 Plateau Pressure:  [21 cmH20] 21 cmH20  No intake or output data in the 24 hours ending 12/05/17 1333 Filed Weights   12/05/17 0956  Weight: 108.9 kg    Examination: General: 62 year old white female no distress but anxious HENT: NCAT tongue asymmetrically swollen R>L w/ muffled speech and drooling.   Lungs: clear w/out accessory use  Cardiovascular: RRR  Abdomen: soft not tender  Extremities: no edema brisk CR Neuro: awake, alert and oriented  GU: voids   Resolved Hospital Problem list     Assessment & Plan:  Acute  angioedema w/ upper airway obstruction resulting in need for mechanical ventilation -post intubation cxr personally reviewed demonstrating bibasilar atx  Plan Admit to ICU Scheduled systemic steroids (60 q 6) Scheduled benadryl and pepcid  (interestingly states allergic to Nizatidine but can take tagamet ) Am cxr  PAD protocol RASS goal -2 to -3 List HCTZ as allergy  Reflux Plan Cont PPI BID  HTN Plan Holding antihypertensive while on diprivan  Add back as indicated  Watch for rebound HTN (was on clonidine)  Mild AKI Plan Gentle IVFs Repeat chem in am  Fluid and electrolyte imbalance: hypokalemia, hypophosphatemia  Plan Replace and recheck   Mild leukocytosis  Plan Trend CBC   Best practice:  Diet: NPO Pain/Anxiety/Delirium protocol (if indicated): 12/1 VAP protocol (if indicated): 12/1 DVT prophylaxis: 12/1 heparin GI prophylaxis: PPI and H2B Glucose control: trend cbg  Mobility: BR Code Status: full code  Family Communication: pending  Disposition: to icu   Labs   CBC: Recent Labs  Lab 12/05/17 1133  WBC 13.2*  NEUTROABS 7.8*  HGB 14.1  HCT 43.8  MCV 85.2  PLT 828    Basic Metabolic Panel: Recent Labs  Lab 12/05/17 1133  NA 137  K 2.7*  CL 102  CO2 23  GLUCOSE 118*  BUN 16  CREATININE 1.25*  CALCIUM 9.0  MG 1.9  PHOS 2.1*   GFR: Estimated Creatinine Clearance: 62.7 mL/min (A) (by C-G formula  based on SCr of 1.25 mg/dL (H)). Recent Labs  Lab 12/05/17 1133  WBC 13.2*  LATICACIDVEN 2.2*    Liver Function Tests: Recent Labs  Lab 12/05/17 1133  AST 34  ALT 35  ALKPHOS 105  BILITOT 0.8  PROT 7.2  ALBUMIN 3.8   No results for input(s): LIPASE, AMYLASE in the last 168 hours. No results for input(s): AMMONIA in the last 168 hours.  ABG    Component Value Date/Time   PHART 7.387 12/05/2017 1149   PCO2ART 46.9 12/05/2017 1149   PO2ART 246.0 (H) 12/05/2017 1149   HCO3 28.2 (H) 12/05/2017 1149   TCO2 30 12/05/2017 1149    O2SAT 100.0 12/05/2017 1149     Coagulation Profile: No results for input(s): INR, PROTIME in the last 168 hours.  Cardiac Enzymes: Recent Labs  Lab 12/05/17 1133  TROPONINI <0.03    HbA1C: Hgb A1c MFr Bld  Date/Time Value Ref Range Status  10/17/2013 08:28 AM 5.5 <5.7 % Final    Comment:    (NOTE)                                                                       According to the ADA Clinical Practice Recommendations for 2011, when HbA1c is used as a screening test:  >=6.5%   Diagnostic of Diabetes Mellitus           (if abnormal result is confirmed) 5.7-6.4%   Increased risk of developing Diabetes Mellitus References:Diagnosis and Classification of Diabetes Mellitus,Diabetes IOEV,0350,09(FGHWE 1):S62-S69 and Standards of Medical Care in         Diabetes - 2011,Diabetes XHBZ,1696,78 (Suppl 1):S11-S61.    CBG: No results for input(s): GLUCAP in the last 168 hours.  Review of Systems:   Not able   Past Medical History  She,  has a past medical history of Abnormal Pap smear of cervix, Anemia, Anxiety, Bulging lumbar disc, GERD (gastroesophageal reflux disease), Hypertension, Lactose intolerance, MVP (mitral valve prolapse), Pyelonephritis, Renal disorder, Sleep apnea, and Spinal stenosis.   Surgical History    Past Surgical History:  Procedure Laterality Date  . ankle fracture  10/09   bilateral-right was surgically repaired  . ANKLE SURGERY  10/10   screws removed  . ANKLE SURGERY  2/12   removal of all hardware right ankle  . APPENDECTOMY    . arm surgery     plate in left arm  . BREAST LUMPECTOMY     right breast  . CHOLECYSTECTOMY    . DILATION AND CURETTAGE OF UTERUS       Social History   reports that she has never smoked. She has never used smokeless tobacco. She reports that she does not drink alcohol or use drugs.   Family History   Her family history includes Breast cancer in her cousin, maternal aunt, and maternal aunt; Breast cancer (age of  onset: 31) in her sister; Cancer (age of onset: 2) in her cousin; Colon polyps in her brother and sister; Congestive Heart Failure in her sister; Congestive Heart Failure (age of onset: 34) in her maternal uncle; Diabetes in her maternal grandmother; Goiter in her mother; Heart disease in her father and mother; Hypertension in her mother; Kidney disease in her maternal grandmother;  Liver cancer in her maternal aunt; Lung cancer in her cousin; Lung cancer (age of onset: 89) in her brother; Other in her brother and mother; Other (age of onset: 10) in her other; Ovarian cancer in her paternal aunt and paternal grandmother; Pancreatic cancer in her cousin; Prostate cancer in her cousin, cousin, cousin, maternal grandfather, paternal grandfather, and paternal uncle; Prostate cancer (age of onset: 7) in her cousin; Prostate cancer (age of onset: 73) in her father; Pyelonephritis in her daughter; Stroke in her father, maternal grandmother, and maternal uncle; Ulcers in her brother; Uterine cancer in her maternal aunt.   Allergies Allergies  Allergen Reactions  . Crab [Shellfish Allergy] Anaphylaxis  . Edarbi [Azilsartan] Hypertension  . Ivp Dye [Iodinated Diagnostic Agents] Anaphylaxis  . Nizatidine Anaphylaxis  . Dilaudid [Hydromorphone Hcl] Nausea And Vomiting    Pt can tolerate morphine  . Hydrocodone-Acetaminophen Nausea And Vomiting  . Oxycodone-Acetaminophen Nausea And Vomiting     Home Medications  Prior to Admission medications   Medication Sig Start Date End Date Taking? Authorizing Provider  albuterol (PROVENTIL HFA;VENTOLIN HFA) 108 (90 Base) MCG/ACT inhaler Inhale 1-2 puffs into the lungs every 6 (six) hours as needed for wheezing or shortness of breath. 10/01/17  Yes Bast, Traci A, NP  Calcium-Magnesium-Vitamin D (CALCIUM 1200+D3) 600-40-500 MG-MG-UNIT TB24 Take 3 tablets by mouth daily.   Yes [provider]  chlorthalidone (HYGROTON) 25 MG tablet Take 25 mg by mouth every  morning. 11/21/17  Yes [provider]  Cimetidine (TAGAMET HB PO) Take 1 tablet by mouth as needed (hives).    Yes [provider]  cloNIDine (CATAPRES) 0.2 MG tablet Take 0.2 mg by mouth 3 (three) times daily.    Yes [provider]  diclofenac sodium (VOLTAREN) 1 % GEL Apply 2 g topically 3 (three) times daily. To affected joint 10/13/17  Yes [provider]  diphenhydrAMINE (BENADRYL) 25 mg capsule Take 25 mg by mouth every 6 (six) hours as needed.   Yes [provider]  esomeprazole (NEXIUM) 40 MG capsule Take 40 mg by mouth daily.    Yes [provider]  ibuprofen (ADVIL,MOTRIN) 800 MG tablet Take 800 mg by mouth every 12 (twelve) hours as needed for moderate pain. Every 12 hours prn    Yes [provider]  Liniments (SALONPAS EX) Apply 1 each topically daily as needed (pain).   Yes [provider]  losartan (COZAAR) 100 MG tablet Take 100 mg by mouth daily.   Yes [provider]  metoprolol succinate (TOPROL-XL) 25 MG 24 hr tablet Take 25 mg by mouth daily.  12/25/14  Yes [provider]  nitroGLYCERIN (NITROSTAT) 0.4 MG SL tablet Place 1 tablet (0.4 mg total) under the tongue every 5 (five) minutes as needed for chest pain. 10/17/13  Yes Reyne Dumas, MD  azithromycin (ZITHROMAX) 250 MG tablet Take 1 tablet (250 mg total) by mouth daily. Take first 2 tablets together, then 1 every day until finished. Patient not taking: Reported on 11/19/2017 10/01/17   Loura Halt A, NP  predniSONE (STERAPRED UNI-PAK 21 TAB) 10 MG (21) TBPK tablet 6 tabs for 1 day, then 5 tabs for 1 das, then 4 tabs for 1 day, then 3 tabs for 1 day, 2 tabs for 1 day, then 1 tab for 1 day Patient not taking: Reported on 11/19/2017 10/01/17   Orvan July, NP     Critical care time: 35 min     Erick Colace ACNP-BC Vel[facility] Heckler  Pulmonary/Critical Care Pager # [phone] OR # [phone] if no answer  Attending Note:  62 year old female  with PMH of HTN who presents to the ED with tongue swelling and drooling.  Patient was admitted to the ED and developed worsening respiratory failure and PCCM was asked to evaluate.  On exam, right side of the tongue is swollen and compromising the patient's airway and per ED staff she was actually worsening.  I reviewed CXR myself, no acute disease noted.  Spoke with the patient at length, after discussion, decision was made to proceed with intubation.  Patient was sedated and intubated without difficulty and tracheostomy was averted.  Started on steroids and benadryl.  Full vent support and f/u ABG.  Propofol for sedation.  The patient is critically ill with multiple organ systems failure and requires high complexity decision making for assessment and support, frequent evaluation and titration of therapies, application of advanced monitoring technologies and extensive interpretation of multiple databases.   Critical Care Time devoted to patient care services described in this note is  45  Minutes. This time reflects time of care of this signee Dr Jennet Maduro. This critical care time does not reflect procedure time, or teaching time or supervisory time of PA/NP/Med student/Med Resident etc but could involve care discussion time.  Rush Farmer, M.D. [facility] Pulmonary/Critical Care Medicine. Pager: [phone]. After hours pager: [phone].

## 2017-12-05 NOTE — ED Notes (Signed)
Attempted report x1. 

## 2017-12-05 NOTE — ED Provider Notes (Signed)
[facility] EMERGENCY DEPARTMENT Provider Note   CSN: [id number] Arrival date & time: 12/05/17  0946     History   Chief Complaint Chief Complaint  Patient presents with  . Angioedema    HPI [name] is a 62 y.o. female.  HPI   [name] is a 62 y.o. female, with a history of GERD, HTN, presenting to the ED with tongue swelling beginning around 9 AM this morning while sitting in church. She took 50 mg Benadryl and a dose of Tagamet HB prior to arrival. States she was started on hydrochlorothiazide this past week.  She was previously on lisinopril years ago, but was taken off due to sun sensitivity. She last took her medications last night.  She did not eat anything or try any new products prior to the onset of swelling. Denies shortness of breath, drooling, vomiting, neck swelling or pain, facial swelling, rash, chest pain, or any other complaints.   Past Medical History:  Diagnosis Date  . Abnormal Pap smear of cervix   . Anemia    as a child  . Anxiety   . Bulging lumbar disc   . GERD (gastroesophageal reflux disease)   . Hypertension   . Lactose intolerance   . MVP (mitral valve prolapse)   . Pyelonephritis   . Renal disorder   . Sleep apnea   . Spinal stenosis     Patient Active Problem List   Diagnosis Date Noted  . Angioedema 12/05/2017  . Family history of breast cancer in female 04/12/2015  . Family history of ovarian cancer 04/12/2015  . Cervical radiculopathy 04/06/2014  . Carpal tunnel syndrome of left wrist 04/06/2014  . Left arm pain 10/27/2013  . Cervical disc disorder with radiculopathy of cervical region 10/27/2013  . Ischemic chest pain ([place]) 10/17/2013  . Chest pain 10/17/2013  . Obstructive sleep apnea 06/28/2013  . Dizziness 06/02/2013  . Headache(784.0) 06/02/2013  . Drowsiness 06/02/2013  . Neck pain 06/02/2013  . ABNORMALITY OF GAIT 03/15/2009  . ANKLE PAIN, BILATERAL 03/14/2009  . Pain in limb  03/14/2009    Past Surgical History:  Procedure Laterality Date  . ankle fracture  10/09   bilateral-right was surgically repaired  . ANKLE SURGERY  10/10   screws removed  . ANKLE SURGERY  2/12   removal of all hardware right ankle  . APPENDECTOMY    . arm surgery     plate in left arm  . BREAST LUMPECTOMY     right breast  . CHOLECYSTECTOMY    . DILATION AND CURETTAGE OF UTERUS       OB History    Gravida  4   Para  2   Term      Preterm      AB  2   Living  2     SAB  2   TAB      Ectopic      Multiple      Live Births           Obstetric Comments  1 stepchild         Home Medications    Prior to Admission medications   Medication Sig Start Date End Date Taking? Authorizing Provider  albuterol (PROVENTIL HFA;VENTOLIN HFA) 108 (90 Base) MCG/ACT inhaler Inhale 1-2 puffs into the lungs every 6 (six) hours as needed for wheezing or shortness of breath. 10/01/17  Yes Bast, Traci A, NP  Calcium-Magnesium-Vitamin D (CALCIUM 1200+D3)  600-40-500 MG-MG-UNIT TB24 Take 3 tablets by mouth daily.   Yes [provider]  chlorthalidone (HYGROTON) 25 MG tablet Take 25 mg by mouth every morning. 11/21/17  Yes [provider]  Cimetidine (TAGAMET HB PO) Take 1 tablet by mouth as needed (hives).    Yes [provider]  cloNIDine (CATAPRES) 0.2 MG tablet Take 0.2 mg by mouth 3 (three) times daily.    Yes [provider]  diclofenac sodium (VOLTAREN) 1 % GEL Apply 2 g topically 3 (three) times daily. To affected joint 10/13/17  Yes [provider]  diphenhydrAMINE (BENADRYL) 25 mg capsule Take 25 mg by mouth every 6 (six) hours as needed.   Yes [provider]  esomeprazole (NEXIUM) 40 MG capsule Take 40 mg by mouth daily.    Yes [provider]  ibuprofen (ADVIL,MOTRIN) 800 MG tablet Take 800 mg by mouth every 12 (twelve) hours as needed for moderate pain. Every 12 hours prn    Yes [provider]    Liniments (SALONPAS EX) Apply 1 each topically daily as needed (pain).   Yes [provider]  losartan (COZAAR) 100 MG tablet Take 100 mg by mouth daily.   Yes [provider]  metoprolol succinate (TOPROL-XL) 25 MG 24 hr tablet Take 25 mg by mouth daily.  12/25/14  Yes [provider]  nitroGLYCERIN (NITROSTAT) 0.4 MG SL tablet Place 1 tablet (0.4 mg total) under the tongue every 5 (five) minutes as needed for chest pain. 10/17/13  Yes Reyne Dumas, MD  azithromycin (ZITHROMAX) 250 MG tablet Take 1 tablet (250 mg total) by mouth daily. Take first 2 tablets together, then 1 every day until finished. Patient not taking: Reported on 11/19/2017 10/01/17   Loura Halt A, NP  predniSONE (STERAPRED UNI-PAK 21 TAB) 10 MG (21) TBPK tablet 6 tabs for 1 day, then 5 tabs for 1 das, then 4 tabs for 1 day, then 3 tabs for 1 day, 2 tabs for 1 day, then 1 tab for 1 day Patient not taking: Reported on 11/19/2017 10/01/17   Orvan July, NP    Family History Family History  Problem Relation Age of Onset  . Diabetes Maternal Grandmother   . Stroke Maternal Grandmother   . Kidney disease Maternal Grandmother   . Ovarian cancer Paternal Grandmother        dx. late 30s-early 55s  . Prostate cancer Paternal Grandfather   . Ovarian cancer Paternal Aunt        (x3) paternal aunts dx. ages 33s-late 70s  . Breast cancer Maternal Aunt        dx. 64s  . Hypertension Mother   . Heart disease Mother   . Goiter Mother   . Other Mother        breast lumpectomy in early 44s, was in the hospital for 5 days after  . Heart disease Father   . Prostate cancer Father 10       s/p orchiectomy  . Stroke Father   . Ulcers Brother   . Other Brother        elevated PSA level w/ surveillance  . Colon polyps Brother        less than 10  . Pyelonephritis Daughter   . Breast cancer Sister 13       left breast ca - poorly differentiated carcinoma with squamous metaplasia; triple neg; has since had  BL mastectomies  . Colon polyps Sister  less than 10  . Congestive Heart Failure Sister   . Prostate cancer Paternal Uncle        (x5) paternal uncles dx. prostate cancer in their 54s  . Prostate cancer Maternal Grandfather        d. late 79s with mets to colon  . Other Other 39       NOS benign brain tumor; +fluid  . Prostate cancer Cousin 32       paternal 1st cousin   . Prostate cancer Cousin        paternal 1st cousin, once-removed dx. 37s  . Prostate cancer Cousin        (x2) paternal 1st cousins, once-removed, dx. early 65s  . Lung cancer Brother 46       paternal half-brother; smoker  . Uterine cancer Maternal Aunt        dx. 30s  . Breast cancer Maternal Aunt        dx. bilateral breast cancers - 39s, 69s  . Liver cancer Maternal Aunt        +hepatitis C; d. 44s  . Congestive Heart Failure Maternal Uncle 68  . Stroke Maternal Uncle        d. 85s; (x2 maternal uncles)  . Cancer Cousin 52       sinus cancer dx. 22s (maternal 1st cousin)  . Lung cancer Cousin        maternal 1st cousin dx. 60s/smoker; maternal 1st cousin  . Pancreatic cancer Cousin        maternal 1st cousin dx. late 33s  . Prostate cancer Cousin        maternal 1st cousin dx. 54  . Breast cancer Cousin        (x4) maternal 1st cousins dx. late 87s, 71s    Social History Social History   Tobacco Use  . Smoking status: Never Smoker  . Smokeless tobacco: Never Used  Substance Use Topics  . Alcohol use: No    Alcohol/week: 0.0 standard drinks  . Drug use: No     Allergies   Crab [shellfish allergy]; Edarbi [azilsartan]; Ivp dye [iodinated diagnostic agents]; Nizatidine; Dilaudid [hydromorphone hcl]; Hydrocodone-acetaminophen; and Oxycodone-acetaminophen   Review of Systems Review of Systems  Constitutional: Negative for chills, diaphoresis and fever.  HENT: Negative for facial swelling, sore throat and trouble swallowing.        Tongue swelling  Respiratory: Negative for choking,  shortness of breath and stridor.   Cardiovascular: Negative for chest pain.  Gastrointestinal: Negative for abdominal pain, nausea and vomiting.  All other systems reviewed and are negative.    Physical Exam Updated Vital Signs BP (!) 171/108 (BP Location: Left Arm)   Pulse 89   Temp 97.8 F (36.6 C) (Oral)   Resp 16   Ht 5' 9.5" (1.765 m)   Wt 108.9 kg   LMP 07/05/2009   SpO2 100%   BMI 34.93 kg/m   Physical Exam  Constitutional: She appears well-developed and well-nourished. No distress.  HENT:  Head: Normocephalic and atraumatic.  Swelling to the patient's tongue, especially on the right side.  She is maintaining her own airway and oral secretions.  No tripoding.  She appears relaxed.  No noted swelling or tenderness into the soft tissues of the neck.  No swelling noted to the face.  Eyes: Conjunctivae are normal.  Neck: Normal range of motion. Neck supple.  Cardiovascular: Normal rate, regular rhythm and intact distal pulses.  Pulmonary/Chest: Effort normal and breath sounds  normal. No stridor. No respiratory distress. She has no wheezes.  Abdominal: There is no guarding.  Musculoskeletal: She exhibits no edema.  Lymphadenopathy:    She has no cervical adenopathy.  Neurological: She is alert.  Skin: Skin is warm and dry. No rash noted. She is not diaphoretic.  Psychiatric: She has a normal mood and affect. Her behavior is normal.  Nursing note and vitals reviewed.    ED Treatments / Results  Labs (all labs ordered are listed, but only abnormal results are displayed) Labs Reviewed  BASIC METABOLIC PANEL  CBC WITH DIFFERENTIAL/PLATELET  HIV ANTIBODY (ROUTINE TESTING W REFLEX)  CBC  COMPREHENSIVE METABOLIC PANEL  MAGNESIUM  PHOSPHORUS  LACTIC ACID, PLASMA  LACTIC ACID, PLASMA  TROPONIN I  TROPONIN I  TROPONIN I  PROCALCITONIN  BLOOD GAS, ARTERIAL  TRIGLYCERIDES    EKG EKG Interpretation  Date/Time:  Sunday December 05 2017 09:52:52 EST Ventricular  Rate:  87 PR Interval:    QRS Duration: 80 QT Interval:  439 QTC Calculation: 529 R Axis:   65 Text Interpretation:  Sinus rhythm Abnormal R-wave progression, early transition Prolonged QT interval No significant change since last tracing Confirmed by Orlie Dakin [phone]) on 12/05/2017 10:54:22 AM   Radiology No results found.  Procedures .Critical Care Performed by: Lorayne Bender, PA-C Authorized by: Lorayne Bender, PA-C   Critical care provider statement:    Critical care time (minutes):  35   Critical care time was exclusive of:  Separately billable procedures and treating other patients   Critical care was necessary to treat or prevent imminent or life-threatening deterioration of the following conditions:  Respiratory failure (Angioedema)   Critical care was time spent personally by me on the following activities:  Development of treatment plan with patient or surrogate, discussions with consultants, examination of patient, obtaining history from patient or surrogate, ordering and review of laboratory studies, ordering and performing treatments and interventions, pulse oximetry and re-evaluation of patient's condition   I assumed direction of critical care for this patient from another provider in my specialty: no     (including critical care time)  Medications Ordered in ED Medications  fentaNYL (SUBLIMAZE) 100 MCG/2ML injection (has no administration in time range)  midazolam (VERSED) 2 MG/2ML injection (has no administration in time range)  heparin injection 5,000 Units (has no administration in time range)  pantoprazole (PROTONIX) injection 40 mg (has no administration in time range)  0.9 %  sodium chloride infusion (has no administration in time range)  methylPREDNISolone sodium succinate (SOLU-MEDROL) 125 mg/2 mL injection 60 mg (has no administration in time range)  diphenhydrAMINE (BENADRYL) injection 25 mg (has no administration in time range)  fentaNYL (SUBLIMAZE)  injection 50 mcg (has no administration in time range)  fentaNYL (SUBLIMAZE) injection 50 mcg (has no administration in time range)  propofol (DIPRIVAN) 1000 MG/100ML infusion (has no administration in time range)  dexamethasone (DECADRON) injection 10 mg (10 mg Intravenous Given 12/05/17 1042)  propofol (DIPRIVAN) 1000 MG/100ML infusion (10 mcg/kg/min  New Bag/Given 12/05/17 1113)  0.9 %  sodium chloride infusion (125 mL/hr Intravenous New Bag/Given 12/05/17 1100)  midazolam (VERSED) injection (2 mg Intravenous Given 12/05/17 1107)  fentaNYL (SUBLIMAZE) injection (100 mcg Intravenous Given 12/05/17 1107)  etomidate (AMIDATE) injection (20 mg Intravenous Given 12/05/17 1108)  rocuronium (ZEMURON) injection (50 mg Intravenous Given 12/05/17 1108)     Initial Impression / Assessment and Plan / ED Course  I have reviewed the triage vital signs and the nursing  notes.  Pertinent labs & imaging results that were available during my care of the patient were reviewed by me and considered in my medical decision making (see chart for details).  Clinical Course as of Dec 05 1133  Sun Dec 05, 2017  1016 RN states patient's swelling is getting worse.  Patient reexamined.  It does appear as though the right side of her tongue is more swollen.  She is still able to retract her tongue into her mouth.  She is sitting upright, but is able to handle her oral secretions without drooling.   [SJ]  30 Spoke with Dr. Nelda Marseille, [place]. States they will come see the patient.   [SJ]  28 Spoke with Dr. Jeannette Corpus, anesthesiologist.  States they will be standing by should critical care decide they are needed.   [SJ]  [place] with Salvadore Dom, critical care NP, and then with Dr. Nelda Marseille. States they will go forward with elective intubation. They do not need anesthesiology at this time.   [SJ]    Clinical Course User Index [SJ] Joy, Shawn C, PA-C    Patient presents with angioedema.  Suspect this may be due to the  patient's losartan.  Some progression in the patient swelling, therefore critical care performed elective intubation.  Patient also is admitted to critical care.    Findings and plan of care discussed with Orlie Dakin, MD. Dr. Winfred Leeds personally evaluated and examined this patient.  Vitals:   12/05/17 1124 12/05/17 1125 12/05/17 1126 12/05/17 1127  BP:  133/82    Pulse: 91 98 91 91  Resp: 16 16 16 16   Temp:      TempSrc:      SpO2: 100% 100% 100% 100%  Weight:      Height:         Final Clinical Impressions(s) / ED Diagnoses   Final diagnoses:  Angioedema, initial encounter    ED Discharge Orders    None       Layla Maw 12/05/17 [place], MD 12/05/17 1724

## 2017-12-05 NOTE — Procedures (Signed)
OGT Placement by MD  Done under direct laryngoscopy during intubation due to vomiting.  Visualized placement in the esophagus and confirmed by auscultation.  AXR pending.  Rush Farmer, M.D. [facility] Pulmonary/Critical Care Medicine. Pager: [phone]. After hours pager: [phone].

## 2017-12-05 NOTE — ED Provider Notes (Signed)
10:10 AM patient developed swelling of her tongue onset 9 AM today.  Swelling progressively worsening.  She denies difficulty breathing.  She does admit to some difficulty with swallowing.  On exam she appears mildly anxious.  Tongue is grossly swollen.  Not protruding from mouth.  Speech is somewhat slurred.  She is handling secretions well.  Respirations are normal.    10:30 a.m. patient states she is having some increased difficulty in swallowing.  Exam is essentially unchanged.  She is handling secretions well.  There is no stridor.  Voice is normal.  Respirations normal.  She does appear anxious.  Critical care consulted to evaluate patient for admission and to help manage airway.  Critical care team intubated patient in the emergency department.  Suspect patient suffered angioedema as result of antihypertensive medication CRITICAL CARE Performed by: Orlie Dakin Total critical care time: 30 minutes Critical care time was exclusive of separately billable procedures and treating other patients. Critical care was necessary to treat or prevent imminent or life-threatening deterioration. Critical care was time spent personally by me on the following activities: development of treatment plan with patient and/or surrogate as well as nursing, discussions with consultants, evaluation of patient's response to treatment, examination of patient, obtaining history from patient or surrogate, ordering and performing treatments and interventions, ordering and review of laboratory studies, ordering and review of radiographic studies, pulse oximetry and re-evaluation of patient's condition.   Orlie Dakin, MD 12/05/17 1122

## 2017-12-05 NOTE — Procedures (Signed)
Intubation Procedure Note [name] [id number] [birth year]  Procedure: Intubation Indications: Airway protection and maintenance  Procedure Details Consent: Risks of procedure as well as the alternatives and risks of each were explained to the (patient/caregiver).  Consent for procedure obtained. Time Out: Verified patient identification, verified procedure, site/side was marked, verified correct patient position, special equipment/implants available, medications/allergies/relevent history reviewed, required imaging and test results available.  Performed  Maximum sterile technique was used including gloves, hand hygiene and mask.  MAC    Evaluation Hemodynamic Status: BP stable throughout; O2 sats: stable throughout Patient's Current Condition: stable Complications: No apparent complications Patient did tolerate procedure well. Chest X-ray ordered to verify placement.  CXR: pending.   YACOUB,WESAM 12/05/2017

## 2017-12-05 NOTE — Progress Notes (Signed)
Pulled tube back 1CM to 26 at the lips

## 2017-12-05 NOTE — ED Notes (Signed)
ED Provider at bedside. 

## 2017-12-06 ENCOUNTER — Ambulatory Visit: Payer: 59

## 2017-12-06 ENCOUNTER — Other Ambulatory Visit: Payer: 59

## 2017-12-06 DIAGNOSIS — T783XXD Angioneurotic edema, subsequent encounter: Secondary | ICD-10-CM

## 2017-12-06 LAB — POCT I-STAT 3, ART BLOOD GAS (G3+)
Acid-base deficit: 1 mmol/L (ref 0.0–2.0)
Bicarbonate: 23.9 mmol/L (ref 20.0–28.0)
O2 Saturation: 95 %
Patient temperature: 97.1
TCO2: 25 mmol/L (ref 22–32)
pCO2 arterial: 36.7 mmHg (ref 32.0–48.0)
pH, Arterial: 7.418 (ref 7.350–7.450)
pO2, Arterial: 70 mmHg — ABNORMAL LOW (ref 83.0–108.0)

## 2017-12-06 LAB — MAGNESIUM: Magnesium: 1.9 mg/dL (ref 1.7–2.4)

## 2017-12-06 LAB — CBC
HCT: 42.8 % (ref 36.0–46.0)
Hemoglobin: 13.9 g/dL (ref 12.0–15.0)
MCH: 27.4 pg (ref 26.0–34.0)
MCHC: 32.5 g/dL (ref 30.0–36.0)
MCV: 84.3 fL (ref 80.0–100.0)
Platelets: 282 10*3/uL (ref 150–400)
RBC: 5.08 MIL/uL (ref 3.87–5.11)
RDW: 13.8 % (ref 11.5–15.5)
WBC: 13.1 10*3/uL — ABNORMAL HIGH (ref 4.0–10.5)
nRBC: 0 % (ref 0.0–0.2)

## 2017-12-06 LAB — PHOSPHORUS: Phosphorus: 2.7 mg/dL (ref 2.5–4.6)

## 2017-12-06 LAB — BASIC METABOLIC PANEL
Anion gap: 13 (ref 5–15)
BUN: 13 mg/dL (ref 8–23)
CO2: 22 mmol/L (ref 22–32)
Calcium: 9 mg/dL (ref 8.9–10.3)
Chloride: 103 mmol/L (ref 98–111)
Creatinine, Ser: 1.08 mg/dL — ABNORMAL HIGH (ref 0.44–1.00)
GFR calc Af Amer: >60 mL/min (ref >60)
GFR calc non Af Amer: 55 mL/min — ABNORMAL LOW (ref >60)
GLUCOSE: 162 mg/dL — AB (ref 70–99)
Potassium: 3.2 mmol/L — ABNORMAL LOW (ref 3.5–5.1)
Sodium: 138 mmol/L (ref 135–145)

## 2017-12-06 LAB — HIV ANTIBODY (ROUTINE TESTING W REFLEX): HIV Screen 4th Generation wRfx: NONREACTIVE

## 2017-12-06 LAB — TROPONIN I: Troponin I: 0.04 ng/mL (ref <0.03)

## 2017-12-06 MED ORDER — POTASSIUM CHLORIDE 20 MEQ/15ML (10%) PO SOLN
30.0000 meq | ORAL | Status: AC
  Administered 2017-12-06 (×2): 30 meq
  Filled 2017-12-06 (×2): qty 30

## 2017-12-06 MED ORDER — ORAL CARE MOUTH RINSE
15.0000 mL | Freq: Two times a day (BID) | OROMUCOSAL | Status: DC
  Administered 2017-12-08 – 2017-12-10 (×3): 15 mL via OROMUCOSAL

## 2017-12-06 MED ORDER — CLONIDINE HCL 0.2 MG PO TABS
0.2000 mg | ORAL_TABLET | Freq: Three times a day (TID) | ORAL | Status: DC
  Administered 2017-12-06 – 2017-12-07 (×5): 0.2 mg via ORAL
  Filled 2017-12-06 (×5): qty 1

## 2017-12-06 NOTE — Procedures (Signed)
Extubation Procedure Note  Patient Details:   Name: [name] DOB: 03/25/1955 MRN: [id number]   Airway Documentation:    Vent end date: 12/06/17 Vent end time: 1052   Evaluation  O2 sats: stable throughout Complications: No apparent complications Patient did tolerate procedure well. Bilateral Breath Sounds: Clear, Diminished   Yes   Pt extubated to 2L N/C.  No stridor noted.  RN @ bedside.  Donnetta Hail 12/06/2017, 10:54 AM

## 2017-12-06 NOTE — Progress Notes (Signed)
[facility] ADULT ICU REPLACEMENT PROTOCOL FOR AM LAB REPLACEMENT ONLY  The patient does apply for the [facility] Adult ICU Electrolyte Replacment Protocol based on the criteria listed below:   1. Is GFR >/= 40 ml/min? Yes.    Patient's GFR today is 13 2. Is urine output >/= 0.5 ml/kg/hr for the last 6 hours? Yes.   Patient's UOP is 0.7 ml/kg/hr 3. Is BUN < 60 mg/dL? Yes.    Patient's BUN today is 55 4. Abnormal electrolyte(s): k 3.2 5. Ordered repletion with: protocol 6. If a panic level lab has been reported, has the CCM MD in charge been notified? No..   Physician:    Ronda Fairly A 12/06/2017 5:40 AM

## 2017-12-06 NOTE — Progress Notes (Signed)
Pt's daughter came &  got staff said her mom feels like her tongue is swelling a little bit- Had personally observed pt's tongue this am using a penlight - did not notice anymore swelling but pt says it feels a little bit more- had noted a little swelling just prior to extubation under the right side of her tongue. Able to talk fine - denies any throat irritation - did say she can hear a swishing , breezy sound in her right ear but no itching - no lip or facial swelling noted. Medicated with Benadryl 25 mg IV.

## 2017-12-06 NOTE — Evaluation (Signed)
Clinical/Bedside Swallow Evaluation Patient Details  Name: [name] MRN: [id number] Date of Birth: 1955/06/05  Today's Date: 12/06/2017 Time: SLP Start Time (ACUTE ONLY): 1438 SLP Stop Time (ACUTE ONLY): 1453 SLP Time Calculation (min) (ACUTE ONLY): 15 min  Past Medical History:  Past Medical History:  Diagnosis Date  . Abnormal Pap smear of cervix   . Anemia    as a child  . Anxiety   . Bulging lumbar disc   . GERD (gastroesophageal reflux disease)   . Hypertension   . Lactose intolerance   . MVP (mitral valve prolapse)   . Pyelonephritis   . Renal disorder   . Sleep apnea   . Spinal stenosis    Past Surgical History:  Past Surgical History:  Procedure Laterality Date  . ankle fracture  10/09   bilateral-right was surgically repaired  . ANKLE SURGERY  10/10   screws removed  . ANKLE SURGERY  2/12   removal of all hardware right ankle  . APPENDECTOMY    . arm surgery     plate in left arm  . BREAST LUMPECTOMY     right breast  . CHOLECYSTECTOMY    . DILATION AND CURETTAGE OF UTERUS     HPI:  62 year old female who presented to [facility] ED morning of 12/05/17 with acute onset of tongue swelling, dysarthria, and difficulty swallowing secretions. ETT 1 day (12/05/17-12/06/17). PMH significant for spinal stenosis, sleep apnea, renal disorder, GERD, HTN, and MVP. CXR showed mild cardiomegaly with borderline mild pulmonary edema and possible left pleural effusion.   Assessment / Plan / Recommendation Clinical Impression  Pt was alert and [place] throughout clinical swallow evaluation today. She denied coughing with ice chips from RN today, however reported difficulty/soreness during swallow, likely due to angioedema and ETT. Pt required rest breaks to coordinate respirations with swallow during 3 oz water challenge and one instance of immediate cough was noted following straw sips of thin. No overt s/s aspiration demonstrated during consumption of puree, regular, or thin  from a cup. SLP recommends regular texture based on this assessment however per medical precautions, MD requests clears if deemed safe with ST with plan to upgrade to regular as tolerated. No straws, rest breaks as needed. No further ST is indicated at this time.  SLP Visit Diagnosis: Dysphagia, unspecified (R13.10)    Aspiration Risk  Mild aspiration risk    Diet Recommendation Other (Comment)(SLP recommend regular, MD wants pt initiate clear until 12/3)   Liquid Administration via: Cup;No straw Medication Administration: Whole meds with puree Supervision: Patient able to self feed Compensations: Slow rate;Small sips/bites Postural Changes: Seated upright at 90 degrees    Other  Recommendations Oral Care Recommendations: Oral care BID   Follow up Recommendations None      Frequency and Duration            Prognosis Prognosis for Safe Diet Advancement: Good      Swallow Study   General HPI: 62 year old female who presented to [facility] ED morning of 12/05/17 with acute onset of tongue swelling, dysarthria, and difficulty swallowing secretions. ETT 1 day (12/05/17-12/06/17). PMH significant for spinal stenosis, sleep apnea, renal disorder, GERD, HTN, and MVP. CXR showed mild cardiomegaly with borderline mild pulmonary edema and possible left pleural effusion. Type of Study: Bedside Swallow Evaluation Previous Swallow Assessment: none found in chart Diet Prior to this Study: NPO Temperature Spikes Noted: No Respiratory Status: Room air History of Recent Intubation: Yes Length of Intubations (  days): 1 days Date extubated: 12/06/17 Behavior/Cognition: Alert;Cooperative;[place] mood Oral Cavity Assessment: Other (comment)(mild/improvnig lingual edema) Oral Care Completed by SLP: No Oral Cavity - Dentition: Adequate natural dentition Vision: Functional for self-feeding Self-Feeding Abilities: Able to feed self Patient Positioning: Upright in bed Baseline Vocal Quality:  Normal Volitional Cough: Strong Volitional Swallow: Able to elicit    Oral/Motor/Sensory Function Overall Oral Motor/Sensory Function: Within functional limits   Ice Chips Ice chips: Not tested   Thin Liquid Thin Liquid: Impaired Presentation: Cup;Straw Oral Phase Impairments: (none) Oral Phase Functional Implications: (none) Pharyngeal  Phase Impairments: Cough - Immediate    Nectar Thick Nectar Thick Liquid: Not tested   Honey Thick Honey Thick Liquid: Not tested   Puree Puree: Within functional limits   Solid    Jettie Booze, Student SLP  Solid: Within functional limits      Jettie Booze 12/06/2017,3:43 PM

## 2017-12-06 NOTE — Progress Notes (Signed)
Confirmed with patient this AM that patient's daughter Francesca Oman is her POA. Patient is alert and oriented when Prop is turned down. Patient states she feels better this AM.

## 2017-12-06 NOTE — Progress Notes (Addendum)
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 Admitted 12/1 w/ acute angioedema.   Past Medical History  HTN, GERD, OSA, MVP,   Significant Hospital Events   12/1  Admit with angioedema > required intubation   Consults:  N/A  Procedures:  ETT 12/1 >>   Significant Diagnostic Tests:    Micro Data:    Antimicrobials:    Interim History / Subjective:  No events overnight, tolerating wean this AM  Objective   Blood pressure (!) 148/88, pulse 85, temperature 98.3 F (36.8 C), temperature source Oral, resp. rate 16, height 5' 9.5" (1.765 m), weight 107.1 kg, last menstrual period 07/05/2009, SpO2 99 %.    Vent Mode: CPAP;PSV FiO2 (%):  [30 %-100 %] 30 % Set Rate:  [16 bmp] 16 bmp Vt Set:  [550 mL] 550 mL PEEP:  [5 cmH20] 5 cmH20 Pressure Support:  [5 cmH20] 5 cmH20 Plateau Pressure:  [16 cmH20-21 cmH20] 19 cmH20   Intake/Output Summary (Last 24 hours) at 12/06/2017 0940 Last data filed at 12/06/2017 0800 Gross per 24 hour  Intake 2305.33 ml  Output 1300 ml  Net 1005.33 ml   Filed Weights   12/05/17 0956 12/06/17 0500  Weight: 108.9 kg 107.1 kg    Examination: General:  Adult female sitting in bed on vent, family at bedside HEENT: MM pink/moist, ETT, no obvious edema (family reports improved) Neuro:  Awake, alert, follows commands / appropriate CV: s1s2 rrr, no m/r/g PULM: even/non-labored, lungs bilaterally clear, able to pull Vt 15.L, cuff leak heard with cough  MA:UQJF, non-tender, bsx4 active  Extremities: warm/dry, no edema  Skin: no rashes or lesions  Resolved Hospital Problem list     Assessment & Plan:   Acute angioedema w/ upper airway obstruction resulting in need for mechanical  ventilation P: PSV as tolerated with goal for extubation  Continue scheduled steroids  HCTZ as allergy   Proceed with extubation, reviewed with attending.    Reflux P: Continue pepcid   HTN P: Hold home antihypertensives Resume home clonidine  Hold home toprol, losartan for now   Mild AKI P: Trend BMP / urinary output Avoid nephrotoxic agents, ensure adequate renal perfusion  Fluid and electrolyte imbalance: hypokalemia, hypophosphatemia  P: Follow, replace as indicated  Mild leukocytosis  P: Trend CBC    Best practice:  Diet: NPO Pain/Anxiety/Delirium protocol (if indicated): 12/1 VAP protocol (if indicated): 12/1 DVT prophylaxis: 12/1 heparin GI prophylaxis: PPI and H2B Glucose control: trend cbg  Mobility: BR Code Status: full code  Family Communication: Patient and family updated at bedside 12/2  Disposition: ICU  Labs   CBC: Recent Labs  Lab 12/05/17 1133 12/06/17 0218  WBC 13.2* 13.1*  NEUTROABS 7.8*  --   HGB 14.1 13.9  HCT 43.8 42.8  MCV 85.2 84.3  PLT 333 354    Basic Metabolic Panel: Recent Labs  Lab 12/05/17 1133 12/06/17 0218  NA 137 138  K 2.7* 3.2*  CL 102 103  CO2 23 22  GLUCOSE 118* 162*  BUN 16 13  CREATININE 1.25* 1.08*  CALCIUM 9.0 9.0  MG 1.9 1.9  PHOS 2.1* 2.7   GFR: Estimated Creatinine Clearance: 71.9 mL/min (A) (by C-G formula based on SCr  of 1.08 mg/dL (H)). Recent Labs  Lab 12/05/17 1133 12/05/17 1403 12/06/17 0218  PROCALCITON <0.10  --   --   WBC 13.2*  --  13.1*  LATICACIDVEN 2.2* 1.7  --     Liver Function Tests: Recent Labs  Lab 12/05/17 1133  AST 34  ALT 35  ALKPHOS 105  BILITOT 0.8  PROT 7.2  ALBUMIN 3.8   No results for input(s): LIPASE, AMYLASE in the last 168 hours. No results for input(s): AMMONIA in the last 168 hours.  ABG    Component Value Date/Time   PHART 7.418 12/06/2017 0415   PCO2ART 36.7 12/06/2017 0415   PO2ART 70.0 (L) 12/06/2017 0415   HCO3 23.9 12/06/2017 0415    TCO2 25 12/06/2017 0415   ACIDBASEDEF 1.0 12/06/2017 0415   O2SAT 95.0 12/06/2017 0415     Coagulation Profile: No results for input(s): INR, PROTIME in the last 168 hours.  Cardiac Enzymes: Recent Labs  Lab 12/05/17 1133 12/05/17 1825 12/06/17 0218  TROPONINI <0.03 0.06* 0.04*    HbA1C: Hgb A1c MFr Bld  Date/Time Value Ref Range Status  10/17/2013 08:28 AM 5.5 <5.7 % Final    Comment:    (NOTE)                                                                       According to the ADA Clinical Practice Recommendations for 2011, when HbA1c is used as a screening test:  >=6.5%   Diagnostic of Diabetes Mellitus           (if abnormal result is confirmed) 5.7-6.4%   Increased risk of developing Diabetes Mellitus References:Diagnosis and Classification of Diabetes Mellitus,Diabetes ENID,7824,23(NTIRW 1):S62-S69 and Standards of Medical Care in         Diabetes - 2011,Diabetes ERXV,4008,67 (Suppl 1):S11-S61.    CBG: No results for input(s): GLUCAP in the last 168 hours.    Critical care time:  30  minutes     Noe Gens, NP-C [facility] Pgr: [phone] or if no answer [phone] 12/06/2017, 9:40 AM  Attending Note:  62 year old female with ARB induced angioedema who presents to PCCM with respiratory failure.  On exam, small leak on wean with clear lungs.  I reviewed CXR myself, no acute disease noted.  Discussed with PCCM-NP.  Will proceed with extubation today.  Titrate O2 for sat of 88-92%.  Treat HTN without ACE/ARB.  Add ARB and ACE to allergy list.  Proceed with diet.  Will evaluate in the afternoon, if stable will transfer to SDU and to [facility] with PCCM off 12/3.  The patient is critically ill with multiple organ systems failure and requires high complexity decision making for assessment and support, frequent evaluation and titration of therapies, application of advanced monitoring technologies and extensive interpretation of multiple databases.    Critical Care Time devoted to patient care services described in this note is  34  Minutes. This time reflects time of care of this signee Dr Jennet Maduro. This critical care time does not reflect procedure time, or teaching time or supervisory time of PA/NP/Med student/Med Resident etc but could involve care discussion time.  Rush Farmer, M.D. [facility]Critical [facility] Medicine. Pager: [phone]. After hours  pager: [phone].

## 2017-12-06 NOTE — Progress Notes (Signed)
Spoke with patient's daughter Francesca Oman this AM. Patient is unsure who the POA is. She stated it is either herself or Garen Lah (possible misspelling). Francesca Oman does not have Cindy's number. Francesca Oman also stated that patient would not want her husband making life care decisions for her. This RN informed daughter that POA paper work will need to be turned into staff immediately.

## 2017-12-07 LAB — CBC
HCT: 39.8 % (ref 36.0–46.0)
Hemoglobin: 13.1 g/dL (ref 12.0–15.0)
MCH: 27.9 pg (ref 26.0–34.0)
MCHC: 32.9 g/dL (ref 30.0–36.0)
MCV: 84.9 fL (ref 80.0–100.0)
Platelets: 264 10*3/uL (ref 150–400)
RBC: 4.69 MIL/uL (ref 3.87–5.11)
RDW: 14.3 % (ref 11.5–15.5)
WBC: 20.1 10*3/uL — ABNORMAL HIGH (ref 4.0–10.5)
nRBC: 0 % (ref 0.0–0.2)

## 2017-12-07 LAB — BASIC METABOLIC PANEL
ANION GAP: 12 (ref 5–15)
BUN: 16 mg/dL (ref 8–23)
CO2: 22 mmol/L (ref 22–32)
Calcium: 8.5 mg/dL — ABNORMAL LOW (ref 8.9–10.3)
Chloride: 107 mmol/L (ref 98–111)
Creatinine, Ser: 1.25 mg/dL — ABNORMAL HIGH (ref 0.44–1.00)
GFR calc Af Amer: 54 mL/min — ABNORMAL LOW (ref >60)
GFR calc non Af Amer: 46 mL/min — ABNORMAL LOW (ref >60)
Glucose, Bld: 136 mg/dL — ABNORMAL HIGH (ref 70–99)
Potassium: 3.4 mmol/L — ABNORMAL LOW (ref 3.5–5.1)
Sodium: 141 mmol/L (ref 135–145)

## 2017-12-07 MED ORDER — ATROPINE SULFATE 1 MG/10ML IJ SOSY
PREFILLED_SYRINGE | INTRAMUSCULAR | Status: AC
  Filled 2017-12-07: qty 10

## 2017-12-07 MED ORDER — PANTOPRAZOLE SODIUM 40 MG PO TBEC
40.0000 mg | DELAYED_RELEASE_TABLET | Freq: Two times a day (BID) | ORAL | Status: DC
  Administered 2017-12-07 – 2017-12-12 (×10): 40 mg via ORAL
  Filled 2017-12-07 (×10): qty 1

## 2017-12-07 MED ORDER — PREDNISONE 20 MG PO TABS
60.0000 mg | ORAL_TABLET | Freq: Two times a day (BID) | ORAL | Status: DC
  Administered 2017-12-08 – 2017-12-09 (×3): 60 mg via ORAL
  Filled 2017-12-07 (×3): qty 3

## 2017-12-07 MED ORDER — FAMOTIDINE 20 MG PO TABS
20.0000 mg | ORAL_TABLET | Freq: Two times a day (BID) | ORAL | Status: DC
  Administered 2017-12-07 – 2017-12-12 (×11): 20 mg via ORAL
  Filled 2017-12-07 (×11): qty 1

## 2017-12-07 MED ORDER — METHYLPREDNISOLONE SODIUM SUCC 125 MG IJ SOLR
60.0000 mg | Freq: Four times a day (QID) | INTRAMUSCULAR | Status: AC
  Administered 2017-12-07: 60 mg via INTRAVENOUS
  Filled 2017-12-07: qty 2

## 2017-12-07 MED ORDER — ISOSORB DINITRATE-HYDRALAZINE 20-37.5 MG PO TABS
1.0000 | ORAL_TABLET | Freq: Three times a day (TID) | ORAL | Status: DC
  Administered 2017-12-07: 1 via ORAL
  Filled 2017-12-07 (×3): qty 1

## 2017-12-07 MED ORDER — FAMOTIDINE 20 MG PO TABS: 20.0000 mg | ORAL_TABLET | ORAL | Status: DC

## 2017-12-07 NOTE — Progress Notes (Signed)
Patient bradycardic in the 40-50's sustained. SBP holding in the 130's. Pt not symptomatic at this time but is c/o dry mouth which she states occurred before her angioedema event. Dr. Thereasa Solo was notified and orders to discontinue Clonidine were placed. Also, pt's level of care orders were transferred from telemetry to ICU for closer monitoring.

## 2017-12-07 NOTE — Evaluation (Signed)
Physical Therapy Evaluation Patient Details Name: [name] MRN: [id number] DOB: 01/08/1955 Today's Date: 12/07/2017   History of Present Illness  This 63 y.o. female admitted with swelling of tongue that progresively worsened requiring intubation due to angioedema likely due to BP meds.  PMH includes;  HTN, spinal stenosis, sleep apnea, renal disorder, pyelonephritis, MVP, anxiety  Clinical Impression  Pt was mildly unsteady on her feet requiring min assist for balance when made to not hold the IV pole during gait.  She will likely return to her baseline quickly, however, PT will follow acutely to make sure.   PT to follow acutely for deficits listed below.    Follow Up Recommendations No PT follow up    Equipment Recommendations  None recommended by PT    Recommendations for Other Services   NA    Precautions / Restrictions Precautions Precautions: Fall Precaution Comments: mildly unsteady on feet Restrictions Weight Bearing Restrictions: No      Mobility  Bed Mobility               General bed mobility comments: Pt was OOB in the chair  Transfers Overall transfer level: Needs assistance Equipment used: None Transfers: Sit to/from Stand Sit to Stand: Supervision         General transfer comment: Close supervision for safety  Ambulation/Gait Ambulation/Gait assistance: Min assist Gait Distance (Feet): 130 Feet Assistive device: 1 person hand held assist Gait Pattern/deviations: Step-through pattern;Staggering left;Staggering right Gait velocity: decreased Gait velocity interpretation: 1.31 - 2.62 ft/sec, indicative of limited community ambulator General Gait Details: Min assist for balance and stability for hallway ambulation.  Pt feels weak and mildly lightheaded during gait.  Min assist mostly for balance.          Balance Overall balance assessment: Needs assistance Sitting-balance support: Feet supported;Bilateral upper extremity  supported Sitting balance-Leahy Scale: Good     Standing balance support: Bilateral upper extremity supported;Single extremity supported Standing balance-Leahy Scale: Fair Standing balance comment: supervision for static standing                             Pertinent Vitals/Pain Pain Assessment: No/denies pain    Home Living Family/patient expects to be discharged to:: Private residence Living Arrangements: Spouse/significant other;Children Available Help at Discharge: Family;Available 24 hours/day Type of Home: House Home Access: Stairs to enter   [employer] of Steps: 2 Home Layout: Two level Home Equipment: Shower seat - built in;Walker - 4 wheels;Bedside commode Additional Comments: daughter able to provide assist as needed at discharge     Prior Function Level of Independence: Independent         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, sports and works as a Engineer, maintenance for [employer]   Dominant Hand: Right    Extremity/Trunk Assessment   Upper Extremity Assessment Upper Extremity Assessment: Defer to OT evaluation    Lower Extremity Assessment Lower Extremity Assessment: Generalized weakness    Cervical / Trunk Assessment Cervical / Trunk Assessment: Normal  Communication   Communication: No difficulties  Cognition Arousal/Alertness: Awake/alert Behavior During Therapy: WFL for tasks assessed/performed Overall Cognitive Status: Within Functional Limits for tasks assessed                                        General Comments General comments (skin integrity,  edema, etc.): VSS and BP only mildly elevated after gait this round 537H systolically increased to 432 systolically.          Assessment/Plan    PT Assessment Patient needs continued PT services  PT Problem List Decreased strength;Decreased activity tolerance;Decreased balance;Decreased mobility;Decreased safety awareness       PT Treatment Interventions DME  instruction;Gait training;Stair training;Functional mobility training;Therapeutic activities;Therapeutic exercise;Balance training;Patient/family education    PT Goals (Current goals can be found in the Care Plan section)  Acute Rehab PT Goals Patient Stated Goal: to return to baseline PT Goal Formulation: With patient Time For Goal Achievement: 12/21/17 Potential to Achieve Goals: Good    Frequency Min 3X/week   Barriers to discharge        Co-evaluation               AM-PAC PT "6 Clicks" Mobility  Outcome Measure Help needed turning from your back to your side while in a flat bed without using bedrails?: A Little Help needed moving from lying on your back to sitting on the side of a flat bed without using bedrails?: A Little Help needed moving to and from a bed to a chair (including a wheelchair)?: A Little Help needed standing up from a chair using your arms (e.g., wheelchair or bedside chair)?: A Little Help needed to walk in hospital room?: A Little Help needed climbing 3-5 steps with a railing? : A Little 6 Click Score: 18    End of Session Equipment Utilized During Treatment: Gait belt Activity Tolerance: Patient tolerated treatment well Patient left: in chair;with call bell/phone within reach;with family/visitor present   PT Visit Diagnosis: Unsteadiness on feet (R26.81);Muscle weakness (generalized) (M62.81)    Time: 7614-7092 PT Time Calculation (min) (ACUTE ONLY): 10 min   Charges:   PT Evaluation $PT Eval Moderate Complexity: 1 Mod         Marget Outten B. Tagen Brethauer, PT, DPT  Acute Rehabilitation [phone] pager [phone]) [phone] office

## 2017-12-07 NOTE — Evaluation (Signed)
Occupational Therapy Evaluation Patient Details Name: [name] MRN: [id number] DOB: 08/05/1955 Today's Date: 12/07/2017    History of Present Illness This 62 y.o. female admitted with swelling of tongue that progresively worsened requiring intubation due to angioedema likely due to BP meds.  PMH includes;  HTN, spinal stenosis, sleep apnea, renal disorder, pyelonephritis, MVP, anxiety   Clinical Impression   .Pt admitted with above. She demonstrates the below listed deficits and will benefit from continued OT to maximize safety and independence with BADLs.  Pt presents to OT with generalized weakness, impaired balance, and decreased activity tolerance.  She currently requires min guard assist for ADLs and functional mobility.  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  Anticipate she will progress quickly to mod I level.  Will follow acutely.        Follow Up Recommendations  No OT follow up;Supervision - Intermittent    Equipment Recommendations  None recommended by OT    Recommendations for Other Services       Precautions / Restrictions Precautions Precautions: Fall Restrictions Weight Bearing Restrictions: No      Mobility Bed Mobility Overal bed mobility: Modified Independent                Transfers Overall transfer level: Needs assistance   Transfers: Sit to/from Stand;Stand Pivot Transfers Sit to Stand: Supervision Stand pivot transfers: Min guard       General transfer comment: mildly unsteady     Balance Overall balance assessment: Needs assistance Sitting-balance support: Feet supported Sitting balance-Leahy Scale: Good     Standing balance support: No upper extremity supported Standing balance-Leahy Scale: Fair Standing balance comment: able to statically stand with min gaurd assist - supervision                            ADL either performed or assessed with clinical  judgement   ADL Overall ADL's : Needs assistance/impaired Eating/Feeding: Independent   Grooming: Wash/dry hands;Wash/dry face;Oral care;Brushing hair;Min guard;Standing   Upper Body Bathing: Set up;Sitting   Lower Body Bathing: Min guard;Sit to/from stand   Upper Body Dressing : Set up;Sitting   Lower Body Dressing: Min guard;Sit to/from stand   Toilet Transfer: Min guard;Ambulation;Comfort height toilet   Toileting- Clothing Manipulation and Hygiene: Min guard;Sit to/from stand       Functional mobility during ADLs: Min guard General ADL Comments: Pt mildly unsteady and fatigues      Vision         Perception     Praxis      Pertinent Vitals/Pain Pain Assessment: Faces Faces Pain Scale: Hurts little more Pain Location: back  Pain Descriptors / Indicators: Aching Pain Intervention(s): Monitored during session     Hand Dominance Right   Extremity/Trunk Assessment Upper Extremity Assessment Upper Extremity Assessment: Generalized weakness   Lower Extremity Assessment Lower Extremity Assessment: Defer to PT evaluation   Cervical / Trunk Assessment Cervical / Trunk Assessment: Normal   Communication Communication Communication: No difficulties   Cognition Arousal/Alertness: Awake/alert Behavior During Therapy: WFL for tasks assessed/performed Overall Cognitive Status: Within Functional Limits for tasks assessed                                     General Comments  BP 176/86; 144/108; 141/83    Exercises  Shoulder Instructions      Home Living Family/patient expects to be discharged to:: Private residence Living Arrangements: Spouse/significant other;Children Available Help at Discharge: Family;Available 24 hours/day Type of Home: House Home Access: Stairs to enter [employer] of Steps: 2   Home Layout: Two level Alternate Level Stairs-Number of Steps: full flight    Bathroom Shower/Tub: Medical illustrator: Handicapped height Bathroom Accessibility: Yes   Home Equipment: Shower seat - built in;Walker - 4 wheels;Bedside commode   Additional Comments: daughter able to provide assist as needed at discharge       Prior Functioning/Environment Level of Independence: Independent        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, sports and works as a Engineer, maintenance for [employer]         OT Problem List: Decreased strength;Decreased activity tolerance;Impaired balance (sitting and/or standing);Cardiopulmonary status limiting activity      OT Treatment/Interventions: Self-care/ADL training;DME and/or AE instruction;Therapeutic activities;Patient/family education;Balance training    OT Goals(Current goals can be found in the care plan section) Acute Rehab OT Goals Patient Stated Goal: to get back to normal  OT Goal Formulation: With patient Time For Goal Achievement: 12/14/17 Potential to Achieve Goals: Good ADL Goals Pt Will Perform Grooming: with modified independence;standing Pt Will Perform Upper Body Bathing: with modified independence;standing;sitting Pt Will Perform Lower Body Bathing: with modified independence;sit to/from stand Pt Will Perform Upper Body Dressing: with modified independence;sitting Pt Will Perform Lower Body Dressing: with modified independence;sit to/from stand Pt Will Transfer to Toilet: with modified independence;ambulating;regular height toilet Pt Will Perform Toileting - Clothing Manipulation and hygiene: with modified independence;sit to/from stand Pt Will Perform Tub/Shower Transfer: Shower transfer;with modified independence;ambulating;shower seat  OT Frequency: Min 2X/week   Barriers to D/C:            Co-evaluation              AM-PAC OT "6 Clicks" Daily Activity     Outcome Measure Help from another person eating meals?: None Help from another person taking care of personal grooming?: A Little Help from another person toileting, which includes using  toliet, bedpan, or urinal?: A Little Help from another person bathing (including washing, rinsing, drying)?: A Little Help from another person to put on and taking off regular upper body clothing?: None Help from another person to put on and taking off regular lower body clothing?: A Little 6 Click Score: 20   End of Session Equipment Utilized During Treatment: Gait belt;Other (comment)(pushed IV pole ) Nurse Communication: Mobility status  Activity Tolerance: Patient tolerated treatment well Patient left: in chair;with call bell/phone within reach  OT Visit Diagnosis: Unsteadiness on feet (R26.81)                Time: 6761-9509 OT Time Calculation (min): 28 min Charges:  OT General Charges $OT Visit: 1 Visit OT Evaluation $OT Eval Moderate Complexity: 1 Mod OT Treatments $Therapeutic Activity: 8-22 mins  Lucille Passy, OTR/L Acute Rehabilitation Services Pager [phone] Office [phone]   Lucille Passy M 12/07/2017, 3:03 PM

## 2017-12-07 NOTE — Progress Notes (Signed)
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 Admitted 12/1 w/ acute angioedema.   Significant Events: 12/1 Admit with angioedema > required intubation  12/2 extubated  12/3 TRH assumed care   Subjective: The patient states she is feeling better overall today but has noted her blood pressure becoming markedly elevated when she attempts to ambulate.  She denies wheezing shortness of breath or feeling of tightness in her throat.  I have clarified the history with the patient and confirmed that she was placed on chlorthalidone and was restarted on losartan just prior to the onset of her acute swelling.  Assessment & Plan:  Acute angioedema w/ upper airway obstruction  Continue scheduled steroids on slow taper - avoid clorthalidone and ARB/ACEi from this point forward   Labile HTN  Patient reports blood pressure has been very difficult to control at home with intermittent episodes of systolics greater than 001 -titration of blood pressure medications will now be difficult given the need to avoid 2 separate classes -transfer to telemetry bed -titrate blood pressure medications as an inpatient to assure they are tolerated well  Reflux Continue pepcid   Mild AKI Follow renal function in serial fashion  Hypokalemia Supplement to goal of 4.0  DVT prophylaxis: SQ heparin  Code Status: FULL CODE Family Communication: no family present at time of exam  Disposition Plan: transfer to tele bed - titrate BP meds - ambulate - wean steroids  Consultants:  PCCM  Antimicrobials:  none   Objective: Blood pressure (!) 145/82, pulse 73, temperature 98.3 F (36.8 C), temperature source Oral, resp. rate 19, height 5' 9.5" (1.765 m), weight 108.8 kg, last menstrual period 07/05/2009, SpO2 96  %.  Intake/Output Summary (Last 24 hours) at 12/07/2017 1421 Last data filed at 12/07/2017 1100 Gross per 24 hour  Intake 4059.04 ml  Output 1000 ml  Net 3059.04 ml   Filed Weights   12/05/17 0956 12/06/17 0500 12/07/17 0500  Weight: 108.9 kg 107.1 kg 108.8 kg    Examination: General: No acute respiratory distress Lungs: Clear to auscultation bilaterally without wheezes or crackles Cardiovascular: Regular rate and rhythm without murmur gallop or rub normal S1 and S2 Abdomen: Nontender, nondistended, soft, bowel sounds positive, no rebound, no ascites, no appreciable mass Extremities: trace B LE edema   CBC: Recent Labs  Lab 12/05/17 1133 12/06/17 0218 12/07/17 0437  WBC 13.2* 13.1* 20.1*  NEUTROABS 7.8*  --   --   HGB 14.1 13.9 13.1  HCT 43.8 42.8 39.8  MCV 85.2 84.3 84.9  PLT 333 282 749   Basic Metabolic Panel: Recent Labs  Lab 12/05/17 1133 12/06/17 0218 12/07/17 0437  NA 137 138 141  K 2.7* 3.2* 3.4*  CL 102 103 107  CO2 23 22 22   GLUCOSE 118* 162* 136*  BUN 16 13 16   CREATININE 1.25* 1.08* 1.25*  CALCIUM 9.0 9.0 8.5*  MG 1.9 1.9  --   PHOS 2.1* 2.7  --    GFR: Estimated Creatinine Clearance: 62.7 mL/min (A) (by C-G formula based on SCr of 1.25 mg/dL (H)).  Liver Function Tests: Recent Labs  Lab 12/05/17 1133  AST 34  ALT 35  ALKPHOS 105  BILITOT 0.8  PROT 7.2  ALBUMIN 3.8    Cardiac Enzymes: Recent Labs  Lab  12/05/17 1133 12/05/17 1825 12/06/17 0218  TROPONINI <0.03 0.06* 0.04*    HbA1C: Hgb A1c MFr Bld  Date/Time Value Ref Range Status  10/17/2013 08:28 AM 5.5 <5.7 % Final    Comment:    (NOTE)                                                                       According to the ADA Clinical Practice Recommendations for 2011, when HbA1c is used as a screening test:  >=6.5%   Diagnostic of Diabetes Mellitus           (if abnormal result is confirmed) 5.7-6.4%   Increased risk of developing Diabetes  [place] References:Diagnosis and Classification of Diabetes Mellitus,Diabetes GEZM,6294,76(LYYTK 1):S62-S69 and Standards of Medical Care in         Diabetes - 2011,Diabetes PTWS,5681,27 (Suppl 1):S11-S61.    Recent Results (from the past 240 hour(s))  MRSA PCR Screening     Status: None   Collection Time: 12/05/17 12:58 PM  Result Value Ref Range Status   MRSA by PCR NEGATIVE NEGATIVE Final    Comment:        The GeneXpert MRSA Assay (FDA approved for NASAL specimens only), is one component of a comprehensive MRSA colonization surveillance program. It is not intended to diagnose MRSA infection nor to guide or monitor treatment for MRSA infections. Performed at [facility], [place] [address], [place], [place] [address]      Scheduled Meds: . cloNIDine  0.2 mg Oral TID  . famotidine  20 mg Oral Q24H  . heparin  5,000 Units Subcutaneous Q8H  . mouth rinse  15 mL Mouth Rinse BID  . methylPREDNISolone (SOLU-MEDROL) injection  60 mg Intravenous Q6H  . pantoprazole  40 mg Oral BID AC   Continuous Infusions: . sodium chloride 75 mL/hr at 12/07/17 1100     LOS: 2 days   Cherene Altes, MD [facility] Office  [phone] Pager - Text Page per Shea Evans  If 7PM-7AM, please contact night-coverage per Amion 12/07/2017, 2:21 PM

## 2017-12-08 ENCOUNTER — Inpatient Hospital Stay (HOSPITAL_COMMUNITY): Payer: 59

## 2017-12-08 DIAGNOSIS — R9431 Abnormal electrocardiogram [ECG] [EKG]: Secondary | ICD-10-CM

## 2017-12-08 LAB — CBC WITH DIFFERENTIAL/PLATELET
Abs Immature Granulocytes: 0.11 10*3/uL — ABNORMAL HIGH (ref 0.00–0.07)
Basophils Absolute: 0 10*3/uL (ref 0.0–0.1)
Basophils Relative: 0 %
Eosinophils Absolute: 0 10*3/uL (ref 0.0–0.5)
Eosinophils Relative: 0 %
HCT: 38 % (ref 36.0–46.0)
Hemoglobin: 12.6 g/dL (ref 12.0–15.0)
Immature Granulocytes: 1 %
LYMPHS PCT: 11 %
Lymphs Abs: 1.9 10*3/uL (ref 0.7–4.0)
MCH: 27.8 pg (ref 26.0–34.0)
MCHC: 33.2 g/dL (ref 30.0–36.0)
MCV: 83.9 fL (ref 80.0–100.0)
Monocytes Absolute: 0.7 10*3/uL (ref 0.1–1.0)
Monocytes Relative: 5 %
NEUTROS ABS: 13.6 10*3/uL — AB (ref 1.7–7.7)
Neutrophils Relative %: 83 %
Platelets: 263 10*3/uL (ref 150–400)
RBC: 4.53 MIL/uL (ref 3.87–5.11)
RDW: 13.9 % (ref 11.5–15.5)
WBC: 16.3 10*3/uL — AB (ref 4.0–10.5)
nRBC: 0 % (ref 0.0–0.2)

## 2017-12-08 LAB — BASIC METABOLIC PANEL
Anion gap: 9 (ref 5–15)
BUN: 22 mg/dL (ref 8–23)
CHLORIDE: 108 mmol/L (ref 98–111)
CO2: 25 mmol/L (ref 22–32)
Calcium: 8.5 mg/dL — ABNORMAL LOW (ref 8.9–10.3)
Creatinine, Ser: 1.19 mg/dL — ABNORMAL HIGH (ref 0.44–1.00)
GFR calc Af Amer: 57 mL/min — ABNORMAL LOW (ref >60)
GFR calc non Af Amer: 49 mL/min — ABNORMAL LOW (ref >60)
Glucose, Bld: 119 mg/dL — ABNORMAL HIGH (ref 70–99)
POTASSIUM: 3.5 mmol/L (ref 3.5–5.1)
Sodium: 142 mmol/L (ref 135–145)

## 2017-12-08 LAB — TSH: TSH: 0.221 u[IU]/mL — ABNORMAL LOW (ref 0.350–4.500)

## 2017-12-08 LAB — LIPID PANEL
CHOL/HDL RATIO: 6.5 ratio
Cholesterol: 203 mg/dL — ABNORMAL HIGH (ref 0–200)
HDL: 31 mg/dL — AB (ref >40)
LDL Cholesterol: 151 mg/dL — ABNORMAL HIGH (ref 0–99)
Triglycerides: 104 mg/dL (ref <150)
VLDL: 21 mg/dL (ref 0–40)

## 2017-12-08 LAB — HEMOGLOBIN A1C
Hgb A1c MFr Bld: 5.7 % — ABNORMAL HIGH (ref 4.8–5.6)
Mean Plasma Glucose: 116.89 mg/dL

## 2017-12-08 LAB — MAGNESIUM: MAGNESIUM: 2.2 mg/dL (ref 1.7–2.4)

## 2017-12-08 LAB — ECHOCARDIOGRAM COMPLETE
Height: 69.5 in
Weight: 3880.1 oz

## 2017-12-08 LAB — T4, FREE: FREE T4: 0.95 ng/dL (ref 0.82–1.77)

## 2017-12-08 MED ORDER — HYDRALAZINE HCL 50 MG PO TABS
100.0000 mg | ORAL_TABLET | Freq: Three times a day (TID) | ORAL | Status: DC
  Administered 2017-12-08: 100 mg via ORAL
  Administered 2017-12-09: 50 mg via ORAL
  Administered 2017-12-09 – 2017-12-12 (×9): 100 mg via ORAL
  Filled 2017-12-08 (×10): qty 2

## 2017-12-08 MED ORDER — CLONIDINE HCL 0.2 MG PO TABS
0.2000 mg | ORAL_TABLET | Freq: Three times a day (TID) | ORAL | Status: DC
  Administered 2017-12-08: 0.2 mg via ORAL
  Filled 2017-12-08: qty 1

## 2017-12-08 MED ORDER — HYDRALAZINE HCL 50 MG PO TABS
50.0000 mg | ORAL_TABLET | Freq: Three times a day (TID) | ORAL | Status: DC
  Administered 2017-12-08: 50 mg via ORAL
  Filled 2017-12-08: qty 1

## 2017-12-08 MED ORDER — CLONIDINE HCL 0.3 MG/24HR TD PTWK
0.3000 mg | MEDICATED_PATCH | TRANSDERMAL | Status: DC
  Administered 2017-12-08: 0.3 mg via TRANSDERMAL
  Filled 2017-12-08: qty 1

## 2017-12-08 NOTE — Progress Notes (Signed)
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 Admitted 12/1 w/ acute angioedema.   Significant Events: 12/1 Admit with angioedema >required intubation  12/2 extubated  12/3 [facility] assumed care   Subjective: seen and examined with family members in the room. Concern about persistent HTN and side effects of medications.  Assessment/Plan: Active Problems:   Airway compromise   Angioedema   Acute respiratory failure with hypoxemia (HCC)   Essential hypertension  Angioedema with upper airway obstruction suspected from ARB Avoid ARB/Acei and clorthalidone Review meds prior to adding Weaning off steroids Monitor vital signs  Labile HTN/uncontrolled Started clonidine patch 0.3 to promote compliance Started hydralazine alone 50 mg TID Monitor VS closely  Intermittent Sinus bradycardia Unclear etiology Initially thought  To be iatrogenic Continue current meds Avoid BB and CCB  Hx of multiple renal cysts Obtain renal US revealed b/l simple appearing cysts, the largest 8cm No hydronephrosis  CKD 3 Baseline cr appears to be 1.1 with GFR 55 Avoid nephrotoxic agents/dehydration and hypotension Repeat BMP am  Leukocytosis Suspect reactive from steroids use No sign of active infective process procalcitonin <0.10 Weaning off steroids  Obesity BMI 35 Weight loss oupt  GERD  C/w PPI    Code Status: full  Disposition Plan: Possible dc tomorrow once BP and HR are better controlled   Consultants:  None  Procedures:  Intubation  Extubation  Antimicrobials: none   DVT prophylaxis:  sq hep tid  Objective: Vitals:   12/08/17 1007 12/08/17 1140 12/08/17 1200 12/08/17 1256  BP: (!) 143/73  (!) 152/78 (!) 152/78  Pulse: 70  (!) 48   Resp: (!) 25  14   Temp:  98.2 F (36.8 C)     TempSrc:  Oral    SpO2: 95%  97%   Weight:      Height:        Intake/Output Summary (Last 24 hours) at 12/08/2017 1314 Last data filed at 12/08/2017 1000 Gross per 24 hour  Intake 1041.25 ml  Output 935 ml  Net 106.25 ml   Filed Weights   12/06/17 0500 12/07/17 0500 12/08/17 0600  Weight: 107.1 kg 108.8 kg 110 kg    Exam:  . General: 62 y.o. year-old female well developed well nourished in no acute distress.  Alert and oriented x3. . Cardiovascular: Regular rate and rhythm with no rubs or gallops.  No thyromegaly or JVD noted.   Marland Kitchen Respiratory: Clear to auscultation with no wheezes or rales. Good inspiratory effort. . Abdomen: Soft nontender nondistended with normal bowel sounds x4 quadrants. . Musculoskeletal: No lower extremity edema. 2/4 pulses in all 4 extremities. . Skin: No ulcerative lesions noted or rashes, . Psychiatry: Mood is appropriate for condition and setting   Data Reviewed: CBC: Recent Labs  Lab 12/05/17 1133 12/06/17 0218 12/07/17 0437 12/08/17 0741  WBC 13.2* 13.1* 20.1* 16.3*  NEUTROABS 7.8*  --   --  13.6*  HGB 14.1 13.9 13.1 12.6  HCT 43.8 42.8 39.8 38.0  MCV 85.2 84.3 84.9 83.9  PLT 333 282 264 517   Basic Metabolic Panel: Recent Labs  Lab 12/05/17 1133 12/06/17 0218 12/07/17 0437 12/08/17 0741  NA 137 138 141 142  K 2.7* 3.2* 3.4* 3.5  CL 102 103 107 108  CO2 23 22 22 25   GLUCOSE 118* 162* 136*  119*  BUN 16 13 16 22   CREATININE 1.25* 1.08* 1.25* 1.19*  CALCIUM 9.0 9.0 8.5* 8.5*  MG 1.9 1.9  --  2.2  PHOS 2.1* 2.7  --   --    GFR: Estimated Creatinine Clearance: 66.1 mL/min (A) (by C-G formula based on SCr of 1.19 mg/dL (H)). Liver Function Tests: Recent Labs  Lab 12/05/17 1133  AST 34  ALT 35  ALKPHOS 105  BILITOT 0.8  PROT 7.2  ALBUMIN 3.8   No results for input(s): LIPASE, AMYLASE in the last 168 hours. No results for input(s): AMMONIA in the last 168 hours. Coagulation Profile: No results for input(s): INR,  PROTIME in the last 168 hours. Cardiac Enzymes: Recent Labs  Lab 12/05/17 1133 12/05/17 1825 12/06/17 0218  TROPONINI <0.03 0.06* 0.04*   BNP (last 3 results) No results for input(s): PROBNP in the last 8760 hours. HbA1C: No results for input(s): HGBA1C in the last 72 hours. CBG: No results for input(s): GLUCAP in the last 168 hours. Lipid Profile: No results for input(s): CHOL, HDL, LDLCALC, TRIG, CHOLHDL, LDLDIRECT in the last 72 hours. Thyroid Function Tests: No results for input(s): TSH, T4TOTAL, FREET4, T3FREE, THYROIDAB in the last 72 hours. Anemia Panel: No results for input(s): VITAMINB12, FOLATE, FERRITIN, TIBC, IRON, RETICCTPCT in the last 72 hours. Urine analysis:    Component Value Date/Time   COLORURINE STRAW (A) 01/14/2016 [place] 01/14/2016 0438   LABSPEC 1.013 01/14/2016 0438   PHURINE 6.0 01/14/2016 0438   GLUCOSEU NEGATIVE 01/14/2016 0438   HGBUR NEGATIVE 01/14/2016 0438   BILIRUBINUR n 03/09/2017 0827   KETONESUR NEGATIVE 01/14/2016 0438   PROTEINUR n 03/09/2017 0827   PROTEINUR NEGATIVE 01/14/2016 0438   UROBILINOGEN negative (A) 03/09/2017 0827   NITRITE n 03/09/2017 0827   NITRITE NEGATIVE 01/14/2016 0438   LEUKOCYTESUR Negative 03/09/2017 0827   Sepsis Labs: @LABRCNTIP (procalcitonin:4,lacticidven:4)  ) Recent Results (from the past 240 hour(s))  MRSA PCR Screening     Status: None   Collection Time: 12/05/17 12:58 PM  Result Value Ref Range Status   MRSA by PCR NEGATIVE NEGATIVE Final    Comment:        The GeneXpert MRSA Assay (FDA approved for NASAL specimens only), is one component of a comprehensive MRSA colonization surveillance program. It is not intended to diagnose MRSA infection nor to guide or monitor treatment for MRSA infections. Performed at [facility], [place] [address], [place], [place] [address]       Studies: US Renal  Result Date: 12/08/2017 CLINICAL DATA:  Pyelonephritis. EXAM: RENAL /  URINARY TRACT ULTRASOUND COMPLETE COMPARISON:  Body CT 03/24/2017 FINDINGS: Right Kidney: Renal measurements: 11.0 x 5.8 x 5.8 = volume: 195 mL . Echogenicity within normal limits. No hydronephrosis visualized. Three predominant benign-appearing cysts in the upper pole of the right kidney measuring 5.9, 4.5 and 4.4 cm in diameter. Left Kidney: Renal measurements: 12.4 x 6.9 x 6.2 = volume: 276 mL. Echogenicity within normal limits. No hydronephrosis visualized. Three dominant cysts in the left kidney with a large cyst in the left renal pelvis measuring 8.0 x 7.6 x 6.9 cm, and 2 benign-appearing cysts within the cortex of the midpolar region measuring 6.9 and 3.7 cm in diameter. Bladder: Decompressed around urinary Foley. IMPRESSION: No evidence of hydronephrosis bilaterally. Bilateral simple appearing renal cysts. The largest cyst in the midpolar region of the left kidney measures 8 cm. Electronically Signed   By: Linwood Dibbles.D.  On: 12/08/2017 12:45    Scheduled Meds: . cloNIDine  0.3 mg Transdermal Weekly  . famotidine  20 mg Oral BID  . heparin  5,000 Units Subcutaneous Q8H  . hydrALAZINE  50 mg Oral Q8H  . mouth rinse  15 mL Mouth Rinse BID  . pantoprazole  40 mg Oral BID AC  . predniSONE  60 mg Oral BID WC    Continuous Infusions:   LOS: 3 days     Kayleen Memos, MD [facility] Pager [phone]  If 7PM-7AM, please contact night-coverage [email or web address] Password [email or web address] 12/08/2017, 1:14 PM

## 2017-12-08 NOTE — Progress Notes (Signed)
Physical Therapy Treatment Patient Details Name: [name] MRN: [id number] DOB: November 27, 1955 Today's Date: 12/08/2017    History of Present Illness This 62 y.o. female admitted with swelling of tongue that progresively worsened requiring intubation due to angioedema likely due to BP meds.  PMH includes;  HTN, spinal stenosis, sleep apnea, renal disorder, pyelonephritis, MVP, anxiety    PT Comments    Patient doing well with therapy today, ambulating hallway distances with decreasing assistance needed. Intermittent use of hand rail, close supervision. BP resting 150/88, 168/80 with activity. Will cont to follow to progress to safely d/c once medically ready. Agree no PT follow up likely needed.     Follow Up Recommendations  No PT follow up     Equipment Recommendations  None recommended by PT    Recommendations for Other Services       Precautions / Restrictions Precautions Precautions: Fall Precaution Comments: mildly unsteady on feet Restrictions Weight Bearing Restrictions: No    Mobility  Bed Mobility               General bed mobility comments: Pt was OOB in the chair  Transfers Overall transfer level: Needs assistance Equipment used: None Transfers: Sit to/from Stand Sit to Stand: Supervision Stand pivot transfers: Min guard       General transfer comment: Close supervision for safety  Ambulation/Gait Ambulation/Gait assistance: Min assist Gait Distance (Feet): 130 Feet Assistive device: 1 person hand held assist;None Gait Pattern/deviations: Step-through pattern;Staggering left;Staggering right Gait velocity: decreased   General Gait Details: pt ambulating wiithout hand held assist intermittent use of hand rail. 1 standing rest break. HR 63-70   Stairs             Wheelchair Mobility    Modified Rankin (Stroke Patients Only)       Balance Overall balance assessment: Needs assistance Sitting-balance support: Feet  supported;Bilateral upper extremity supported Sitting balance-Leahy Scale: Good     Standing balance support: Bilateral upper extremity supported;Single extremity supported Standing balance-Leahy Scale: Fair Standing balance comment: supervision for static standing                            Cognition Arousal/Alertness: Awake/alert Behavior During Therapy: WFL for tasks assessed/performed Overall Cognitive Status: Within Functional Limits for tasks assessed                                        Exercises      General Comments General comments (skin integrity, edema, etc.): BP 168/80 with activity, 150/88 Resting. HR 64      Pertinent Vitals/Pain Pain Assessment: No/denies pain    Home Living                      Prior Function            PT Goals (current goals can now be found in the care plan section) Acute Rehab PT Goals Patient Stated Goal: to return to baseline PT Goal Formulation: With patient Time For Goal Achievement: 12/21/17 Potential to Achieve Goals: Good Progress towards PT goals: Progressing toward goals    Frequency    Min 3X/week      PT Plan Current plan remains appropriate    Co-evaluation              AM-PAC PT "6 Clicks" Mobility  Outcome Measure  Help needed turning from your back to your side while in a flat bed without using bedrails?: A Little Help needed moving from lying on your back to sitting on the side of a flat bed without using bedrails?: A Little Help needed moving to and from a bed to a chair (including a wheelchair)?: A Little Help needed standing up from a chair using your arms (e.g., wheelchair or bedside chair)?: A Little Help needed to walk in hospital room?: A Little Help needed climbing 3-5 steps with a railing? : A Little 6 Click Score: 18    End of Session Equipment Utilized During Treatment: Gait belt Activity Tolerance: Patient tolerated treatment well Patient  left: with call bell/phone within reach;with family/visitor present;in bed Nurse Communication: Mobility status PT Visit Diagnosis: Unsteadiness on feet (R26.81);Muscle weakness (generalized) (M62.81)     Time: 0981-1914 PT Time Calculation (min) (ACUTE ONLY): 18 min  Charges:  $Gait Training: 8-22 mins                     Reinaldo Berber, PT, DPT Acute Rehabilitation Services Pager: [phone] Office: [phone]     Reinaldo Berber 12/08/2017, 6:14 PM

## 2017-12-08 NOTE — Progress Notes (Signed)
  Echocardiogram 2D Echocardiogram has been performed.  [name] 12/08/2017, 1:35 PM

## 2017-12-08 NOTE — Progress Notes (Signed)
Pt complaining of itching to palms of hands, slight erythema to area, benadryl given, see MAR. Will continue to monitor closely.

## 2017-12-09 LAB — BASIC METABOLIC PANEL
ANION GAP: 9 (ref 5–15)
BUN: 24 mg/dL — ABNORMAL HIGH (ref 8–23)
CO2: 23 mmol/L (ref 22–32)
Calcium: 8.4 mg/dL — ABNORMAL LOW (ref 8.9–10.3)
Chloride: 107 mmol/L (ref 98–111)
Creatinine, Ser: 1.04 mg/dL — ABNORMAL HIGH (ref 0.44–1.00)
GFR calc Af Amer: >60 mL/min (ref >60)
GFR, EST NON AFRICAN AMERICAN: 58 mL/min — AB (ref >60)
Glucose, Bld: 162 mg/dL — ABNORMAL HIGH (ref 70–99)
Potassium: 3.2 mmol/L — ABNORMAL LOW (ref 3.5–5.1)
Sodium: 139 mmol/L (ref 135–145)

## 2017-12-09 MED ORDER — METOPROLOL TARTRATE 5 MG/5ML IV SOLN
5.0000 mg | Freq: Once | INTRAVENOUS | Status: AC
  Administered 2017-12-09: 5 mg via INTRAVENOUS
  Filled 2017-12-09: qty 5

## 2017-12-09 MED ORDER — PREDNISONE 20 MG PO TABS
40.0000 mg | ORAL_TABLET | Freq: Every day | ORAL | Status: AC
  Administered 2017-12-10: 40 mg via ORAL
  Filled 2017-12-09: qty 2

## 2017-12-09 MED ORDER — SPIRONOLACTONE 50 MG PO TABS
50.0000 mg | ORAL_TABLET | Freq: Every day | ORAL | Status: DC
  Administered 2017-12-09: 50 mg via ORAL
  Filled 2017-12-09: qty 1

## 2017-12-09 MED ORDER — METOPROLOL TARTRATE 25 MG PO TABS
25.0000 mg | ORAL_TABLET | Freq: Two times a day (BID) | ORAL | Status: DC
  Administered 2017-12-09 – 2017-12-12 (×7): 25 mg via ORAL
  Filled 2017-12-09 (×7): qty 1

## 2017-12-09 MED ORDER — POTASSIUM CHLORIDE CRYS ER 20 MEQ PO TBCR
40.0000 meq | EXTENDED_RELEASE_TABLET | Freq: Two times a day (BID) | ORAL | Status: AC
  Administered 2017-12-09 (×2): 40 meq via ORAL
  Filled 2017-12-09 (×2): qty 2

## 2017-12-09 MED ORDER — DIPHENHYDRAMINE HCL 25 MG PO CAPS: 25.0000 mg | ORAL_CAPSULE | Freq: Four times a day (QID) | ORAL | Status: DC | PRN

## 2017-12-09 MED ORDER — LORAZEPAM 2 MG/ML IJ SOLN
2.0000 mg | Freq: Once | INTRAMUSCULAR | Status: AC
  Administered 2017-12-10: 2 mg via INTRAVENOUS
  Filled 2017-12-09: qty 1

## 2017-12-09 MED ORDER — HYDRALAZINE HCL 20 MG/ML IJ SOLN
10.0000 mg | Freq: Once | INTRAMUSCULAR | Status: AC
  Administered 2017-12-09: 10 mg via INTRAVENOUS
  Filled 2017-12-09: qty 1

## 2017-12-09 MED ORDER — SPIRONOLACTONE 25 MG PO TABS
100.0000 mg | ORAL_TABLET | Freq: Every day | ORAL | Status: DC
  Administered 2017-12-10: 100 mg via ORAL
  Administered 2017-12-11: 50 mg via ORAL
  Filled 2017-12-09: qty 2
  Filled 2017-12-09: qty 4

## 2017-12-09 MED ORDER — HYDRALAZINE HCL 20 MG/ML IJ SOLN: 10.0000 mg | INTRAMUSCULAR | Status: DC | PRN

## 2017-12-09 MED ORDER — ATORVASTATIN CALCIUM 40 MG PO TABS
40.0000 mg | ORAL_TABLET | Freq: Every day | ORAL | Status: DC
  Administered 2017-12-09 – 2017-12-11 (×3): 40 mg via ORAL
  Filled 2017-12-09 (×3): qty 1

## 2017-12-09 MED ORDER — ALPRAZOLAM 0.5 MG PO TABS
1.0000 mg | ORAL_TABLET | Freq: Once | ORAL | Status: AC
  Administered 2017-12-10: 1 mg via ORAL
  Filled 2017-12-09: qty 2

## 2017-12-09 MED ORDER — SPIRONOLACTONE 50 MG PO TABS
50.0000 mg | ORAL_TABLET | Freq: Once | ORAL | Status: AC
  Administered 2017-12-09: 50 mg via ORAL
  Filled 2017-12-09: qty 1

## 2017-12-09 MED ORDER — AMLODIPINE BESYLATE 5 MG PO TABS
5.0000 mg | ORAL_TABLET | Freq: Every day | ORAL | Status: DC
  Filled 2017-12-09: qty 1

## 2017-12-09 NOTE — Progress Notes (Addendum)
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 Admitted on 12/1 w/ acute angioedema.   Significant Events: 12/1 Admit with angioedema >required intubation to protect her airway 12/2 extubated  12/3 TR[facility] assumed care   Subjective: Patient was seen and examined with her daughter at her bedside.  Reports pruritus affecting the palm of her hands bilaterally.  Uncontrolled hypertension noted this morning.  Currently on maximum dose of amlodipine, hydralazine, and clonidine patch.  Added spironolactone 100 mg daily.  Due to uncontrolled hypertension patient was given 1 dose of IV hydralazine 10 mg once, immediately after that, she had reflex tachycardia with rate in the 150s.  Given 1 dose of IV Lopressor 5 mg once with resolution of her sinus tachycardia.  Reported to RN headaches with uncontrolled hypertension.  Assessment/Plan: Active Problems:   Airway compromise   Angioedema   Acute respiratory failure with hypoxemia (HCC)   Essential hypertension  Angioedema with upper airway obstruction post intubation and extubation suspected from ARB Intubated on 12/05/2017 and extubated on 12/06/2017 Avoid ARB/Acei and clorthalidone Review meds prior to adding new ones Weaning off steroids Continue to closely monitor vital signs  Malignant hypertension Currently on max dose of clonidine patch, amlodipine, and hydralazine Started spironolactone 100 mg daily Continue to closely monitor vital signs Consulted nephrology  Hyperlipidemia ASCVD risk of 13% Start Lipitor 40 mg daily LDL 151, total cholesterol 203, HDL 31  Hypokalemia Potassium 3.2 Repleted Repeat BMP in the morning  Intermittent Sinus bradycardia Unclear etiology Initially thought to be iatrogenic from  clonidine Continue current meds Avoid BB and CCB  Chronic Multiple renal cysts Renal US revealed b/l simple appearing cysts, the largest 8cm No hydronephrosis Nephrology has been consulted to further assess  CKD 3 Baseline cr appears to be 1.04 with GFR 58 She is at her baseline Continue to avoid nephrotoxic agents/dehydration and hypotension Repeat BMP am  Leukocytosis Suspect reactive from steroids use No sign of active infective process procalcitonin <0.10 Weaning off steroids  Obesity BMI 35 Weight loss oupt  GERD  C/w PPI    Code Status: full  Disposition Plan: Possible dc tomorrow once BP and HR are better controlled   Consultants:  Nephrology  Procedures:  Intubation  Extubation  Antimicrobials: none   DVT prophylaxis: Subcu Lovenox daily  Objective: Vitals:   12/09/17 1400 12/09/17 1500 12/09/17 1510 12/09/17 1515  BP: (!) 170/99 (!) 151/78 (!) 151/78   Pulse: (!) 143 85 98 82  Resp: (!) 21 (!) 22  (!) 24  Temp:      TempSrc:      SpO2: 97% 96%  97%  Weight:      Height:        Intake/Output Summary (Last 24 hours) at 12/09/2017 1543 Last data filed at 12/09/2017 1500 Gross per 24 hour  Intake 360 ml  Output 1530 ml  Net -1170 ml   Filed Weights   12/07/17 0500 12/08/17 0600 12/09/17 0500  Weight: 108.8 kg 110 kg 110.6 kg    Exam:  . General: 62 y.o. year-old female [name] well-developed well-nourished in no acute distress.  Alert and oriented x3.   . Cardiovascular: Regular rate and rhythm with no rubs or gallops.  No JVD or thyromegaly noted. Marland Kitchen Respiratory: Clear to auscultation with no  wheezes or rales.  Good inspiratory effort. . Abdomen: Soft nontender nondistended with normal bowel sounds x4 quadrants. . Musculoskeletal: No lower extremity edema. 2/4 pulses in all 4 extremities. . Skin: No ulcerative lesions noted or rashes . Psychiatry: Mood is appropriate for condition and setting.   Data Reviewed: CBC: Recent  Labs  Lab 12/05/17 1133 12/06/17 0218 12/07/17 0437 12/08/17 0741  WBC 13.2* 13.1* 20.1* 16.3*  NEUTROABS 7.8*  --   --  13.6*  HGB 14.1 13.9 13.1 12.6  HCT 43.8 42.8 39.8 38.0  MCV 85.2 84.3 84.9 83.9  PLT 333 282 264 700   Basic Metabolic Panel: Recent Labs  Lab 12/05/17 1133 12/06/17 0218 12/07/17 0437 12/08/17 0741 12/09/17 0259  NA 137 138 141 142 139  K 2.7* 3.2* 3.4* 3.5 3.2*  CL 102 103 107 108 107  CO2 23 22 22 25 23   GLUCOSE 118* 162* 136* 119* 162*  BUN 16 13 16 22  24*  CREATININE 1.25* 1.08* 1.25* 1.19* 1.04*  CALCIUM 9.0 9.0 8.5* 8.5* 8.4*  MG 1.9 1.9  --  2.2  --   PHOS 2.1* 2.7  --   --   --    GFR: Estimated Creatinine Clearance: 76 mL/min (A) (by C-G formula based on SCr of 1.04 mg/dL (H)). Liver Function Tests: Recent Labs  Lab 12/05/17 1133  AST 34  ALT 35  ALKPHOS 105  BILITOT 0.8  PROT 7.2  ALBUMIN 3.8   No results for input(s): LIPASE, AMYLASE in the last 168 hours. No results for input(s): AMMONIA in the last 168 hours. Coagulation Profile: No results for input(s): INR, PROTIME in the last 168 hours. Cardiac Enzymes: Recent Labs  Lab 12/05/17 1133 12/05/17 1825 12/06/17 0218  TROPONINI <0.03 0.06* 0.04*   BNP (last 3 results) No results for input(s): PROBNP in the last 8760 hours. HbA1C: Recent Labs    12/08/17 2210  HGBA1C 5.7*   CBG: No results for input(s): GLUCAP in the last 168 hours. Lipid Profile: Recent Labs    12/08/17 2210  CHOL 203*  HDL 31*  LDLCALC 151*  TRIG 104  CHOLHDL 6.5   Thyroid Function Tests: Recent Labs    12/08/17 1231 12/08/17 2210  TSH 0.221*  --   FREET4  --  0.95   Anemia Panel: No results for input(s): VITAMINB12, FOLATE, FERRITIN, TIBC, IRON, RETICCTPCT in the last 72 hours. Urine analysis:    Component Value Date/Time   COLORURINE STRAW (A) 01/14/2016 [place] 01/14/2016 0438   LABSPEC 1.013 01/14/2016 0438   PHURINE 6.0 01/14/2016 0438   GLUCOSEU  NEGATIVE 01/14/2016 0438   HGBUR NEGATIVE 01/14/2016 0438   BILIRUBINUR n 03/09/2017 0827   KETONESUR NEGATIVE 01/14/2016 0438   PROTEINUR n 03/09/2017 0827   PROTEINUR NEGATIVE 01/14/2016 0438   UROBILINOGEN negative (A) 03/09/2017 0827   NITRITE n 03/09/2017 0827   NITRITE NEGATIVE 01/14/2016 0438   LEUKOCYTESUR Negative 03/09/2017 0827   Sepsis Labs: @LABRCNTIP (procalcitonin:4,lacticidven:4)  ) Recent Results (from the past 240 hour(s))  MRSA PCR Screening     Status: None   Collection Time: 12/05/17 12:58 PM  Result Value Ref Range Status   MRSA by PCR NEGATIVE NEGATIVE Final    Comment:        The GeneXpert MRSA Assay (FDA approved for NASAL specimens only), is one component of a comprehensive MRSA colonization surveillance program. It is not intended to diagnose MRSA infection nor to guide or monitor treatment for MRSA infections.  Performed at [facility], [place] [address] [place], [place] [address]       Studies: No results found.  Scheduled Meds: . atorvastatin  40 mg Oral q1800  . cloNIDine  0.3 mg Transdermal Weekly  . famotidine  20 mg Oral BID  . heparin  5,000 Units Subcutaneous Q8H  . hydrALAZINE  100 mg Oral Q8H  . mouth rinse  15 mL Mouth Rinse BID  . metoprolol tartrate  25 mg Oral BID  . pantoprazole  40 mg Oral BID AC  . potassium chloride  40 mEq Oral BID  . [START ON 12/10/2017] predniSONE  40 mg Oral Q breakfast  . [START ON 12/10/2017] spironolactone  100 mg Oral Daily    Continuous Infusions:   LOS: 4 days     Kayleen Memos, MD [facility] Pager [phone]  If 7PM-7AM, please contact night-coverage [email or web address] Password [facility] 12/09/2017, 3:43 PM

## 2017-12-09 NOTE — Progress Notes (Signed)
Paged Dr. Marval Regal at [facility] ([phone]) spoke to answering service and explained we need clarification on the order before we can get the patient to MRI.  We need to know if the MD wants contrast or a study without contrast.

## 2017-12-09 NOTE — Consult Note (Signed)
Reason for Consult: Refractory HTN Referring Physician:  Nevada Crane, MD  [name] is an 62 y.o. female.  HPI: [name] has a PMH significant for poorly controlled HTN despite multiple agents (and drug intolerances), GERD, and CKD stage 2-3 who presented on 12/05/17 with sudden onset of angioedema.  She had been on losartan for years but recently had this agent changed to include HCTZ (after being taken off of amlodipine for edema).  The angioedema progressed and she had to be intubated for airway protection.  She was treated with pepcid and prednisone and was able to be extubated 12/06/17.  Her care was transitioned from PCCM to the hospitalists service.  Unfortunately her BP remained labile with SBP's >200 at times as well as having intermittent tachycardia and bradycardia.  Her medications were altered to catapress patch 0.3 (was on clonidine 0.2 tid po), and hydralazine and spironalactone were added, however she continues to have spikes in BP.  Of note, she was taking NSAIDs as an outpatient and is still receiving prednisone 56m daily.  She also admits to ocassional palpitations and flushing when her BP's and HR are elevated.  Trend in Creatinine: Creatinine, Ser  Date/Time Value Ref Range Status  12/09/2017 02:59 AM 1.04 (H) 0.44 - 1.00 mg/dL Final  12/08/2017 07:41 AM 1.19 (H) 0.44 - 1.00 mg/dL Final  12/07/2017 04:37 AM 1.25 (H) 0.44 - 1.00 mg/dL Final  12/06/2017 02:18 AM 1.08 (H) 0.44 - 1.00 mg/dL Final  12/05/2017 11:33 AM 1.25 (H) 0.44 - 1.00 mg/dL Final  11/19/2017 09:58 AM 1.23 (H) 0.44 - 1.00 mg/dL Final  10/17/2013 06:00 AM 1.03 0.50 - 1.10 mg/dL Final  06/22/2013 12:39 PM 1.08 0.50 - 1.10 mg/dL Final  02/05/2010 02:00 PM 1.12 0.4 - 1.2 mg/dL Final  10/14/2007 01:08 PM 1.1  Final    PMH:   Past Medical History:  Diagnosis Date  . Abnormal Pap smear of cervix   . Anemia    as a child  . Anxiety   . Bulging lumbar disc   . GERD (gastroesophageal reflux disease)   .  Hypertension   . Lactose intolerance   . MVP (mitral valve prolapse)   . Pyelonephritis   . Renal disorder   . Sleep apnea   . Spinal stenosis     PSH:   Past Surgical History:  Procedure Laterality Date  . ankle fracture  10/09   bilateral-right was surgically repaired  . ANKLE SURGERY  10/10   screws removed  . ANKLE SURGERY  2/12   removal of all hardware right ankle  . APPENDECTOMY    . arm surgery     plate in left arm  . BREAST LUMPECTOMY     right breast  . CHOLECYSTECTOMY    . DILATION AND CURETTAGE OF UTERUS      Allergies:  Allergies  Allergen Reactions  . Ace Inhibitors     Angioedema   . Chlorthalidone     Angioedema   . Crab [Shellfish Allergy] Anaphylaxis  . Edarbi [Azilsartan] Hypertension  . Ivp Dye [Iodinated Diagnostic Agents] Anaphylaxis  . Losartan     Angioedema   . Nizatidine Anaphylaxis    Tolerates famotidine and cimetidine  . Dilaudid [Hydromorphone Hcl] Nausea And Vomiting    Pt can tolerate morphine  . Hydrocodone-Acetaminophen Nausea And Vomiting  . Oxycodone-Acetaminophen Nausea And Vomiting    Medications:   Prior to Admission medications   Medication Sig Start Date End Date Taking? Authorizing Provider  albuterol (  PROVENTIL HFA;VENTOLIN HFA) 108 (90 Base) MCG/ACT inhaler Inhale 1-2 puffs into the lungs every 6 (six) hours as needed for wheezing or shortness of breath. 10/01/17  Yes Bast, Traci A, NP  Calcium-Magnesium-Vitamin D (CALCIUM 1200+D3) 600-40-500 MG-MG-UNIT TB24 Take 3 tablets by mouth daily.   Yes [provider]  chlorthalidone (HYGROTON) 25 MG tablet Take 25 mg by mouth every morning. 11/21/17  Yes [provider]  Cimetidine (TAGAMET HB PO) Take 1 tablet by mouth as needed (hives).    Yes [provider]  cloNIDine (CATAPRES) 0.2 MG tablet Take 0.2 mg by mouth 3 (three) times daily.    Yes [provider]  diclofenac sodium (VOLTAREN) 1 % GEL Apply 2 g topically 3 (three) times  daily. To affected joint 10/13/17  Yes [provider]  diphenhydrAMINE (BENADRYL) 25 mg capsule Take 25 mg by mouth every 6 (six) hours as needed.   Yes [provider]  esomeprazole (NEXIUM) 40 MG capsule Take 40 mg by mouth daily.    Yes [provider]  ibuprofen (ADVIL,MOTRIN) 800 MG tablet Take 800 mg by mouth every 12 (twelve) hours as needed for moderate pain. Every 12 hours prn    Yes [provider]  Liniments (SALONPAS EX) Apply 1 each topically daily as needed (pain).   Yes [provider]  metoprolol succinate (TOPROL-XL) 25 MG 24 hr tablet Take 25 mg by mouth daily.  12/25/14  Yes [provider]  nitroGLYCERIN (NITROSTAT) 0.4 MG SL tablet Place 1 tablet (0.4 mg total) under the tongue every 5 (five) minutes as needed for chest pain. 10/17/13  Yes Reyne Dumas, MD  azithromycin (ZITHROMAX) 250 MG tablet Take 1 tablet (250 mg total) by mouth daily. Take first 2 tablets together, then 1 every day until finished. Patient not taking: Reported on 11/19/2017 10/01/17   Loura Halt A, NP  predniSONE (STERAPRED UNI-PAK 21 TAB) 10 MG (21) TBPK tablet 6 tabs for 1 day, then 5 tabs for 1 das, then 4 tabs for 1 day, then 3 tabs for 1 day, 2 tabs for 1 day, then 1 tab for 1 day Patient not taking: Reported on 11/19/2017 10/01/17   Orvan July, NP    Inpatient medications: . atorvastatin  40 mg Oral q1800  . cloNIDine  0.3 mg Transdermal Weekly  . famotidine  20 mg Oral BID  . heparin  5,000 Units Subcutaneous Q8H  . hydrALAZINE  100 mg Oral Q8H  . mouth rinse  15 mL Mouth Rinse BID  . metoprolol tartrate  25 mg Oral BID  . pantoprazole  40 mg Oral BID AC  . potassium chloride  40 mEq Oral BID  . [START ON 12/10/2017] predniSONE  40 mg Oral Q breakfast  . [START ON 12/10/2017] spironolactone  100 mg Oral Daily    Discontinued Meds:   Medications Discontinued During This Encounter  Medication Reason  . EDARBYCLOR 40-12.5 MG TABS  Allergic reaction  . amLODipine (NORVASC) 5 MG tablet Discontinued by provider  . fentaNYL (SUBLIMAZE) injection 50 mcg   . propofol (DIPRIVAN) 1000 MG/100ML infusion   . losartan (COZAAR) 100 MG tablet Allergic reaction  . chlorhexidine gluconate (MEDLINE KIT) (PERIDEX) 0.12 % solution 15 mL   . MEDLINE mouth rinse   . pantoprazole (PROTONIX) injection 40 mg Duplicate  . famotidine (PEPCID) IVPB 20 mg premix Duplicate  . 0.9 %  sodium chloride infusion   . fentaNYL (SUBLIMAZE) injection 50 mcg   . methylPREDNISolone sodium  succinate (SOLU-MEDROL) 125 mg/2 mL injection 60 mg   . famotidine (PEPCID) tablet 20 mg   . cloNIDine (CATAPRES) tablet 0.2 mg   . isosorbide-hydrALAZINE (BIDIL) 20-37.5 MG per tablet 1 tablet   . cloNIDine (CATAPRES) tablet 0.2 mg   . hydrALAZINE (APRESOLINE) tablet 50 mg   . predniSONE (DELTASONE) tablet 60 mg   . spironolactone (ALDACTONE) tablet 50 mg   . hydrALAZINE (APRESOLINE) injection 10 mg   . diphenhydrAMINE (BENADRYL) injection 25 mg   . amLODipine (NORVASC) tablet 5 mg     Social History:  reports that she has never smoked. She has never used smokeless tobacco. She reports that she does not drink alcohol or use drugs.  Family History:   Family History  Problem Relation Age of Onset  . Diabetes Maternal Grandmother   . Stroke Maternal Grandmother   . Kidney disease Maternal Grandmother   . Ovarian cancer Paternal Grandmother        dx. late 30s-early 49s  . Prostate cancer Paternal Grandfather   . Ovarian cancer Paternal Aunt        (x3) paternal aunts dx. ages 15s-late 70s  . Breast cancer Maternal Aunt        dx. 79s  . Hypertension Mother   . Heart disease Mother   . Goiter Mother   . Other Mother        breast lumpectomy in early 3s, was in the hospital for 5 days after  . Heart disease Father   . Prostate cancer Father 53       s/p orchiectomy  . Stroke Father   . Ulcers Brother   . Other Brother        elevated PSA level w/  surveillance  . Colon polyps Brother        less than 10  . Pyelonephritis Daughter   . Breast cancer Sister 75       left breast ca - poorly differentiated carcinoma with squamous metaplasia; triple neg; has since had BL mastectomies  . Colon polyps Sister        less than 10  . Congestive Heart Failure Sister   . Prostate cancer Paternal Uncle        (x5) paternal uncles dx. prostate cancer in their 55s  . Prostate cancer Maternal Grandfather        d. late 52s with mets to colon  . Other Other 39       NOS benign brain tumor; +fluid  . Prostate cancer Cousin 7       paternal 1st cousin   . Prostate cancer Cousin        paternal 1st cousin, once-removed dx. 75s  . Prostate cancer Cousin        (x2) paternal 1st cousins, once-removed, dx. early 64s  . Lung cancer Brother 86       paternal half-brother; smoker  . Uterine cancer Maternal Aunt        dx. 30s  . Breast cancer Maternal Aunt        dx. bilateral breast cancers - 51s, 12s  . Liver cancer Maternal Aunt        +hepatitis C; d. 45s  . Congestive Heart Failure Maternal Uncle 68  . Stroke Maternal Uncle        d. 22s; (x2 maternal uncles)  . Cancer Cousin 52       sinus cancer dx. 25s (maternal 1st cousin)  . Lung cancer Cousin  maternal 1st cousin dx. 60s/smoker; maternal 1st cousin  . Pancreatic cancer Cousin        maternal 1st cousin dx. late 7s  . Prostate cancer Cousin        maternal 1st cousin dx. 23  . Breast cancer Cousin        (x4) maternal 1st cousins dx. late 76s, 96s    Pertinent items are noted in HPI. Weight change: 0.632 kg  Intake/Output Summary (Last 24 hours) at 12/09/2017 1826 Last data filed at 12/09/2017 1500 Gross per 24 hour  Intake 360 ml  Output 1480 ml  Net -1120 ml   BP (!) 154/81   Pulse 66   Temp 97.7 F (36.5 C) (Oral)   Resp (!) 21   Ht 5' 9.5" (1.765 m)   Wt 110.6 kg   LMP 07/05/2009   SpO2 97%   BMI 35.50 kg/m  Vitals:   12/09/17 1606 12/09/17 1630  12/09/17 1700 12/09/17 1730  BP:  139/78 (!) 138/92 (!) 154/81  Pulse:  70 69 66  Resp:  19 16 (!) 21  Temp: 97.7 F (36.5 C)     TempSrc: Oral     SpO2:  97% 95% 97%  Weight:      Height:         General appearance: alert, cooperative, no distress and appears flushed Head: Normocephalic, without obvious abnormality, atraumatic Neck: no adenopathy, no carotid bruit, no JVD, supple, symmetrical, trachea midline and thyroid not enlarged, symmetric, no tenderness/mass/nodules Resp: clear to auscultation bilaterally Cardio: regular rate and rhythm, S1, S2 normal, no murmur, click, rub or gallop GI: soft, non-tender; bowel sounds normal; no masses,  no organomegaly Extremities: edema trace-1+ pretibial edema  Labs: Basic Metabolic Panel: Recent Labs  Lab 12/05/17 1133 12/06/17 0218 12/07/17 0437 12/08/17 0741 12/09/17 0259  NA 137 138 141 142 139  K 2.7* 3.2* 3.4* 3.5 3.2*  CL 102 103 107 108 107  CO2 _0 GLUCOSE 118* 162* 136* 119* 162*  BUN _1 24*  CREATININE 1.25* 1.08* 1.25* 1.19* 1.04*  ALBUMIN 3.8  --   --   --   --   CALCIUM 9.0 9.0 8.5* 8.5* 8.4*  PHOS 2.1* 2.7  --   --   --    Liver Function Tests: Recent Labs  Lab 12/05/17 1133  AST 34  ALT 35  ALKPHOS 105  BILITOT 0.8  PROT 7.2  ALBUMIN 3.8   No results for input(s): LIPASE, AMYLASE in the last 168 hours. No results for input(s): AMMONIA in the last 168 hours. CBC: Recent Labs  Lab 12/05/17 1133 12/06/17 0218 12/07/17 0437 12/08/17 0741  WBC 13.2* 13.1* 20.1* 16.3*  NEUTROABS 7.8*  --   --  13.6*  HGB 14.1 13.9 13.1 12.6  HCT 43.8 42.8 39.8 38.0  MCV 85.2 84.3 84.9 83.9  PLT 333 282 264 263   PT/INR: _2 (inr:5) Cardiac Enzymes: ) Recent Labs  Lab 12/05/17 1133 12/05/17 1825 12/06/17 0218  TROPONINI <0.03 0.06* 0.04*   CBG: No results for input(s): GLUCAP in the last 168 hours.  Iron Studies: No results for input(s): IRON, TIBC, TRANSFERRIN, FERRITIN in  the last 168 hours.  Xrays/Other Studies: US Renal  Result Date: 12/08/2017 CLINICAL DATA:  Pyelonephritis. EXAM: RENAL / URINARY TRACT ULTRASOUND COMPLETE COMPARISON:  Body CT 03/24/2017 FINDINGS: Right Kidney: Renal measurements: 11.0 x 5.8 x 5.8 = volume: 195 mL . Echogenicity within normal limits. No hydronephrosis  visualized. Three predominant benign-appearing cysts in the upper pole of the right kidney measuring 5.9, 4.5 and 4.4 cm in diameter. Left Kidney: Renal measurements: 12.4 x 6.9 x 6.2 = volume: 276 mL. Echogenicity within normal limits. No hydronephrosis visualized. Three dominant cysts in the left kidney with a large cyst in the left renal pelvis measuring 8.0 x 7.6 x 6.9 cm, and 2 benign-appearing cysts within the cortex of the midpolar region measuring 6.9 and 3.7 cm in diameter. Bladder: Decompressed around urinary Foley. IMPRESSION: No evidence of hydronephrosis bilaterally. Bilateral simple appearing renal cysts. The largest cyst in the midpolar region of the left kidney measures 8 cm. Electronically Signed   By: Fidela Salisbury M.D.   On: 12/08/2017 12:45     Assessment/Plan: 1.  Labile HTN- she gives a history of HTN for at least 6 years and has had difficult to control HTN worsening over the past few months and has had multiple changes to medications due to adverse events/intolerances.  The prednisone is likely exacerbating her HTN and would recommend to titrate as quickly as possible to help assist in BP management.  She will also need workup for secondary HTN and will order renin/aldo, catecholamines, metanephrines.  Would also recommend MRA of renal arteries to r/o RAS/FMD as a potential contributor.  Will add metoprolol 25 mg bid to meds and follow as she appears to respond well to iv metoprolol and if she tolerates it would favor changing to long-acting form.  Agree with aldactone given HTN and hypokalemia but would like to have aldo and renin levels prior to dosing.  Would  also consider adding lasix 58m due to her edema but will hold off for now since we are making several changes today. 2. Renal cysts- seen on previous studies.  No family history of PKD 3. Hypokalemia- replete and follow 4. CKD stage 3 presumably due to HTN, stable but instructed her to avoid NSAIDs which will also contribute to poorly controlled HTN and CKD. 5. arythmias- she alternates between bradycardia and tachycardia and recommend cardiology evaluation in am.  6. Angioedema- resolved and appreciate PCCM care but would recommend rapid taper from prednisone given BP issues.   JGovernor Sampson 12/09/2017, 6:26 PM

## 2017-12-09 NOTE — Progress Notes (Signed)
Patient's HR went into the 150s with flushed appearance. [facility] called 5mg  of metoprolol ordered, and nephrology called regarding patient's status. EKG when completed shows SR. Will continue to monitor closely. Modena Morrow E, RN 12/09/2017 1358

## 2017-12-10 ENCOUNTER — Inpatient Hospital Stay (HOSPITAL_COMMUNITY): Payer: 59

## 2017-12-10 LAB — RENAL FUNCTION PANEL
Albumin: 2.8 g/dL — ABNORMAL LOW (ref 3.5–5.0)
Anion gap: 9 (ref 5–15)
BUN: 28 mg/dL — ABNORMAL HIGH (ref 8–23)
CHLORIDE: 108 mmol/L (ref 98–111)
CO2: 24 mmol/L (ref 22–32)
Calcium: 8.7 mg/dL — ABNORMAL LOW (ref 8.9–10.3)
Creatinine, Ser: 1.26 mg/dL — ABNORMAL HIGH (ref 0.44–1.00)
GFR calc Af Amer: 53 mL/min — ABNORMAL LOW (ref >60)
GFR, EST NON AFRICAN AMERICAN: 46 mL/min — AB (ref >60)
Glucose, Bld: 92 mg/dL (ref 70–99)
Phosphorus: 3.1 mg/dL (ref 2.5–4.6)
Potassium: 3.6 mmol/L (ref 3.5–5.1)
Sodium: 141 mmol/L (ref 135–145)

## 2017-12-10 LAB — CORTISOL: Cortisol, Plasma: 1.9 ug/dL

## 2017-12-10 MED ORDER — PREDNISONE 20 MG PO TABS
20.0000 mg | ORAL_TABLET | Freq: Every day | ORAL | Status: AC
  Administered 2017-12-11: 20 mg via ORAL
  Filled 2017-12-10: qty 1

## 2017-12-10 MED ORDER — GADOBUTROL 1 MMOL/ML IV SOLN
7.5000 mL | Freq: Once | INTRAVENOUS | Status: AC | PRN
  Administered 2017-12-10: 7.5 mL via INTRAVENOUS

## 2017-12-10 MED ORDER — PREDNISONE 20 MG PO TABS: 20.0000 mg | ORAL_TABLET | Freq: Every day | ORAL | Status: DC

## 2017-12-10 MED ORDER — PREDNISONE 5 MG PO TABS
10.0000 mg | ORAL_TABLET | Freq: Every day | ORAL | Status: AC
  Administered 2017-12-12: 10 mg via ORAL
  Filled 2017-12-10: qty 2

## 2017-12-10 MED ORDER — ACETAMINOPHEN 325 MG PO TABS
650.0000 mg | ORAL_TABLET | Freq: Four times a day (QID) | ORAL | Status: DC | PRN
  Administered 2017-12-10: 650 mg via ORAL
  Filled 2017-12-10: qty 2

## 2017-12-10 MED ORDER — PREDNISONE 10 MG PO TABS: 10.0000 mg | ORAL_TABLET | Freq: Every day | ORAL | Status: DC

## 2017-12-10 NOTE — Progress Notes (Addendum)
[facility] Progress Note    Assessment/ Plan:   1. Labile HTN- she gives a history of HTN for at least 6 years and has had difficult to control HTN worsening over the past few months and has had multiple changes to medications due to adverse events/intolerances.  The prednisone is likely exacerbating her HTN and would recommend to titrate as quickly as possible to help assist in BP management.  She will also need workup for secondary HTN and renin/aldo, catecholamines, metanephrines are pending.  Renin/ aldo taken after aldactone started yesterday so may falsely suppress results.  AM cortisol low at 1.9 which is expected in the setting of pred use, could repeat after pred tapered (would wait at least 48-72 hours after last pred dose to repeat).  MRA of renal arteries to r/o RAS/FMD as a potential contributor--> this was negative.  metoprolol 25 mg bid to meds added, good response.  Will add Lasix 20 mg daily tomorrow- aldactone increased for today.  2. Renal cysts- seen on previous studies.  No family history of PKD  3. Hypokalemia- replete and follow  4. CKD stage 3 presumably due to HTN, stable but instructed her to avoid NSAIDs which will also contribute to poorly controlled HTN and CKD.  5. arythmias- she alternates between bradycardia and tachycardia and recommend cardiology evaluation in am.   6. Angioedema- resolved and appreciate PCCM care but would recommend rapid taper from prednisone given BP issues.  Subjective:    No events overnight.  BP coming under better control with the changes made yesterday.  Pt feeling better and has no complaints today.     Objective:   BP (!) 136/92   Pulse 65   Temp 98.6 F (37 C) (Oral)   Resp 16   Ht 5' 9.5" (1.765 m)   Wt 111.2 kg   LMP 07/05/2009   SpO2 92%   BMI 35.68 kg/m   Intake/Output Summary (Last 24 hours) at 12/10/2017 1027 Last data filed at 12/09/2017 2000 Gross per 24 hour  Intake 180 ml  Output 1530 ml   Net -1350 ml   Weight change: 0.568 kg  Physical Exam: Gen: alert, lying in bed, NAD CVS: RRR no m/r/g Resp: clear bilaterally Abd: obese without bruits Ext: trace LE edema  Imaging: US Renal  Result Date: 12/08/2017 CLINICAL DATA:  Pyelonephritis. EXAM: RENAL / URINARY TRACT ULTRASOUND COMPLETE COMPARISON:  Body CT 03/24/2017 FINDINGS: Right Kidney: Renal measurements: 11.0 x 5.8 x 5.8 = volume: 195 mL . Echogenicity within normal limits. No hydronephrosis visualized. Three predominant benign-appearing cysts in the upper pole of the right kidney measuring 5.9, 4.5 and 4.4 cm in diameter. Left Kidney: Renal measurements: 12.4 x 6.9 x 6.2 = volume: 276 mL. Echogenicity within normal limits. No hydronephrosis visualized. Three dominant cysts in the left kidney with a large cyst in the left renal pelvis measuring 8.0 x 7.6 x 6.9 cm, and 2 benign-appearing cysts within the cortex of the midpolar region measuring 6.9 and 3.7 cm in diameter. Bladder: Decompressed around urinary Foley. IMPRESSION: No evidence of hydronephrosis bilaterally. Bilateral simple appearing renal cysts. The largest cyst in the midpolar region of the left kidney measures 8 cm. Electronically Signed   By: Fidela Salisbury M.D.   On: 12/08/2017 12:45   Mr Jodene Nam Abdomen W Wo Contrast  Result Date: 12/10/2017 CLINICAL DATA:  Uncontrolled hypertension EXAM: MRA ABDOMEN WITH CONTRAST TECHNIQUE: Multiplanar, multiecho pulse sequences of the abdomen were obtained with intravenous contrast. Angiographic images  of abdomen were obtained using MRA technique with intravenous contrast. CONTRAST:  7.5 cc Gadavist COMPARISON:  None. FINDINGS: Vascular: Aorta is nonaneurysmal and patent. Celiac is patent. SMA origin is patent. There is mild narrowing just beyond the origin. Single renal arteries are widely patent. There is no convincing evidence of fibromuscular dysplasia. IMA is diminutive and patent. Bilateral common iliac, external iliac,  and internal iliac arteries are patent. Nonvascular: Liver is unremarkable.  Postcholecystectomy. Pancreas and spleen are within normal limits. Several benign appearing cysts are scattered throughout the kidneys. Adrenal glands are within normal limits. Visualized bowel is within normal limits. No free fluid.  No abnormal retroperitoneal adenopathy. IMPRESSION: No evidence of renal artery stenosis. Electronically Signed   By: Marybelle Killings M.D.   On: 12/10/2017 07:40    Labs: BMET Recent Labs  Lab 12/05/17 1133 12/06/17 0218 12/07/17 0437 12/08/17 0741 12/09/17 0259 12/10/17 0505  NA 137 138 141 142 139 141  K 2.7* 3.2* 3.4* 3.5 3.2* 3.6  CL 102 103 107 108 107 108  CO2 23 22 22 25 23 24   GLUCOSE 118* 162* 136* 119* 162* 92  BUN 16 13 16 22  24* 28*  CREATININE 1.25* 1.08* 1.25* 1.19* 1.04* 1.26*  CALCIUM 9.0 9.0 8.5* 8.5* 8.4* 8.7*  PHOS 2.1* 2.7  --   --   --  3.1   CBC Recent Labs  Lab 12/05/17 1133 12/06/17 0218 12/07/17 0437 12/08/17 0741  WBC 13.2* 13.1* 20.1* 16.3*  NEUTROABS 7.8*  --   --  13.6*  HGB 14.1 13.9 13.1 12.6  HCT 43.8 42.8 39.8 38.0  MCV 85.2 84.3 84.9 83.9  PLT 333 282 264 263    Medications:    . atorvastatin  40 mg Oral q1800  . cloNIDine  0.3 mg Transdermal Weekly  . famotidine  20 mg Oral BID  . heparin  5,000 Units Subcutaneous Q8H  . hydrALAZINE  100 mg Oral Q8H  . mouth rinse  15 mL Mouth Rinse BID  . metoprolol tartrate  25 mg Oral BID  . pantoprazole  40 mg Oral BID AC  . predniSONE  40 mg Oral Q breakfast  . spironolactone  100 mg Oral Daily      Madelon Lips, MD 12/10/2017, 10:27 AM

## 2017-12-10 NOTE — Progress Notes (Addendum)
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   Significant Events: 12/1 Admit with angioedema > required intubation  12/2 extubated  12/3 [facility] assumed care   Subjective: Patient is resting comfortably in bed.  She denies swelling or itching of the tongue face or mouth.  She denies chest pain shortness of breath fevers chills nausea vomiting.  She states she feels "a little better today".  Assessment & Plan:  Acute angioedema w/ upper airway obstruction  Rapidly tapering steroids - avoid clorthalidone and ARB/ACEi from this point forward -appears stable at this time -watch for recurrence of symptoms with further weaning of steroids  Labile HTN  Patient reports blood pressure has been very difficult to control at home with intermittent episodes of systolics greater than 283 -nephrology assistance has been recruited -blood pressure much improved on current regimen -work-up for secondary hypertension ongoing -no evidence of RAS on MRI  Intermittent bradycardia  Appears to have stabilized for now -intermittent dips into the upper 50s are well-tolerated with preserved systolic pressure - exercise care w/ blood pressure medications which can adversely affect her heart rate -monitor on telemetry -doubt cardiology would have much to add at this time but will definitely need to follow trend  Reflux Continue pepcid   Mild AKI on CKD Stage 3 Follow renal function in serial fashion -nephrology following - baseline crt 1.04  Hypokalemia Corrected  Obesity - Body mass index is 35.68 kg/m.   DVT prophylaxis: SQ heparin  Code Status: FULL CODE Family Communication: no family present at time of exam  Disposition Plan: transfer to tele bed - titrate BP meds - ambulate - wean steroids  Consultants:    PCCM  Antimicrobials:  none   Objective: Blood pressure (!) 136/92, pulse 65, temperature 98.6 F (37 C), temperature source Oral, resp. rate 16, height 5' 9.5" (1.765 m), weight 111.2 kg, last menstrual period 07/05/2009, SpO2 92 %.  Intake/Output Summary (Last 24 hours) at 12/10/2017 1045 Last data filed at 12/09/2017 2000 Gross per 24 hour  Intake 180 ml  Output 1530 ml  Net -1350 ml   Filed Weights   12/08/17 0600 12/09/17 0500 12/10/17 0500  Weight: 110 kg 110.6 kg 111.2 kg    Examination: General: No acute respiratory distress - alert and [place]  Lungs: Clear to auscultation B - no wheezing  Cardiovascular: RRR - no M or rub  Abdomen: NT/ND, soft, bs+, no mass  Extremities: trace B LE edema w/o change    CBC: Recent Labs  Lab 12/05/17 1133 12/06/17 0218 12/07/17 0437 12/08/17 0741  WBC 13.2* 13.1* 20.1* 16.3*  NEUTROABS 7.8*  --   --  13.6*  HGB 14.1 13.9 13.1 12.6  HCT 43.8 42.8 39.8 38.0  MCV 85.2 84.3 84.9 83.9  PLT 333 282 264 151   Basic Metabolic Panel: Recent Labs  Lab 12/05/17 1133 12/06/17 0218  12/08/17 0741 12/09/17 0259 12/10/17 0505  NA 137 138   < > 142 139 141  K 2.7* 3.2*   < > 3.5 3.2* 3.6  CL 102 103   < > 108 107 108  CO2 23 22   < > 25 23 24   GLUCOSE 118* 162*   < > 119* 162* 92  BUN 16 13   < > 22 24* 28*  CREATININE 1.25* 1.08*   < > 1.19* 1.04* 1.26*  CALCIUM 9.0 9.0   < > 8.5* 8.4* 8.7*  MG 1.9 1.9  --  2.2  --   --   PHOS 2.1* 2.7  --   --   --  3.1   < > = values in this interval not displayed.   GFR: Estimated Creatinine Clearance: 62.8 mL/min (A) (by C-G formula based on SCr of 1.26 mg/dL (H)).  Liver Function Tests: Recent Labs  Lab 12/05/17 1133 12/10/17 0505  AST 34  --   ALT 35  --   ALKPHOS 105  --   BILITOT 0.8  --   PROT 7.2  --   ALBUMIN 3.8 2.8*    Cardiac Enzymes: Recent Labs  Lab 12/05/17 1133 12/05/17 1825 12/06/17 0218  TROPONINI <0.03 0.06* 0.04*    HbA1C: Hgb A1c MFr Bld   Date/Time Value Ref Range Status  12/08/2017 10:10 PM 5.7 (H) 4.8 - 5.6 % Final    Comment:    (NOTE) Pre diabetes:          5.7%-6.4% Diabetes:              >6.4% Glycemic control for   <7.0% adults with diabetes   10/17/2013 08:28 AM 5.5 <5.7 % Final    Comment:    (NOTE)                                                                       According to the ADA Clinical Practice Recommendations for 2011, when HbA1c is used as a screening test:  >=6.5%   Diagnostic of Diabetes Mellitus           (if abnormal result is confirmed) 5.7-6.4%   Increased risk of developing Diabetes Mellitus References:Diagnosis and Classification of Diabetes Mellitus,Diabetes VOHY,0737,10(GYIRS 1):S62-S69 and Standards of Medical Care in         Diabetes - 2011,Diabetes WNIO,2703,50 (Suppl 1):S11-S61.    Recent Results (from the past 240 hour(s))  MRSA PCR Screening     Status: None   Collection Time: 12/05/17 12:58 PM  Result Value Ref Range Status   MRSA by PCR NEGATIVE NEGATIVE Final    Comment:        The GeneXpert MRSA Assay (FDA approved for NASAL specimens only), is one component of a comprehensive MRSA colonization surveillance program. It is not intended to diagnose MRSA infection nor to guide or monitor treatment for MRSA infections. Performed at [facility], [place] [address] [place], [place] [address]      Scheduled Meds: . atorvastatin  40 mg Oral q1800  . cloNIDine  0.3 mg Transdermal Weekly  . famotidine  20 mg Oral BID  . heparin  5,000 Units Subcutaneous Q8H  . hydrALAZINE  100 mg Oral Q8H  . mouth rinse  15 mL Mouth Rinse BID  . metoprolol tartrate  25 mg Oral BID  . pantoprazole  40 mg Oral BID AC  . predniSONE  40 mg Oral Q breakfast  . spironolactone  100 mg Oral Daily      LOS: 5 days   Cherene Altes, MD [facility] Office  [phone] Pager - Text Page per Amion  If 7PM-7AM,  please contact night-coverage per Amion 12/10/2017, 10:45  AM

## 2017-12-10 NOTE — Progress Notes (Signed)
Physical Therapy Treatment Patient Details Name: [name] MRN: [id number] DOB: 04-27-1955 Today's Date: 12/10/2017    History of Present Illness This 62 y.o. female admitted with swelling of tongue that progresively worsened requiring intubation due to angioedema likely due to BP meds.  PMH includes;  HTN, spinal stenosis, sleep apnea, renal disorder, pyelonephritis, MVP, anxiety    PT Comments    Patient ambulating hallway today, reports c/o of dizziness since medications for CT, stairs deferred but would like to focus on these next PT session. BP 126/60 with activity. No other complaints at this time.    Follow Up Recommendations  No PT follow up     Equipment Recommendations  None recommended by PT    Recommendations for Other Services       Precautions / Restrictions Restrictions Weight Bearing Restrictions: No    Mobility  Bed Mobility Overal bed mobility: Modified Independent                Transfers Overall transfer level: Modified independent                  Ambulation/Gait Ambulation/Gait assistance: Supervision;Min guard Gait Distance (Feet): 120 Feet Assistive device: 1 person hand held assist;None Gait Pattern/deviations: Step-through pattern;Staggering left;Staggering right Gait velocity: decreased   General Gait Details: pt reports dizzines from ativan and xnanx earlier today for CT, however ambulating without AD or added assistance from therapist, just stand by assist and intermittent guard. HR 95-115   Stairs             Wheelchair Mobility    Modified Rankin (Stroke Patients Only)       Balance Overall balance assessment: Needs assistance Sitting-balance support: Feet supported;Bilateral upper extremity supported Sitting balance-Leahy Scale: Good     Standing balance support: Bilateral upper extremity supported;Single extremity supported Standing balance-Leahy Scale: Fair Standing balance comment: supervision  for static standing                            Cognition Arousal/Alertness: Awake/alert Behavior During Therapy: WFL for tasks assessed/performed Overall Cognitive Status: Within Functional Limits for tasks assessed                                        Exercises      General Comments        Pertinent Vitals/Pain Pain Assessment: No/denies pain    Home Living                      Prior Function            PT Goals (current goals can now be found in the care plan section) Acute Rehab PT Goals Patient Stated Goal: to return to baseline PT Goal Formulation: With patient Time For Goal Achievement: 12/21/17 Potential to Achieve Goals: Good Progress towards PT goals: Progressing toward goals    Frequency    Min 3X/week      PT Plan Current plan remains appropriate    Co-evaluation              AM-PAC PT "6 Clicks" Mobility   Outcome Measure  Help needed turning from your back to your side while in a flat bed without using bedrails?: None Help needed moving from lying on your back to sitting on the side of a flat bed  without using bedrails?: None Help needed moving to and from a bed to a chair (including a wheelchair)?: A Little Help needed standing up from a chair using your arms (e.g., wheelchair or bedside chair)?: A Little Help needed to walk in hospital room?: A Little Help needed climbing 3-5 steps with a railing? : A Little 6 Click Score: 20    End of Session Equipment Utilized During Treatment: Gait belt Activity Tolerance: Patient tolerated treatment well Patient left: with call bell/phone within reach;with family/visitor present;in bed Nurse Communication: Mobility status PT Visit Diagnosis: Unsteadiness on feet (R26.81);Muscle weakness (generalized) (M62.81)     Time: 9373-4287 PT Time Calculation (min) (ACUTE ONLY): 22 min  Charges:  $Gait Training: 8-22 mins                     Reinaldo Berber, PT,  DPT Acute Rehabilitation Services Pager: [phone] Office: [phone]     Reinaldo Berber 12/10/2017, 11:28 AM

## 2017-12-11 LAB — RENAL FUNCTION PANEL
ANION GAP: 9 (ref 5–15)
Albumin: 2.7 g/dL — ABNORMAL LOW (ref 3.5–5.0)
BUN: 21 mg/dL (ref 8–23)
CO2: 25 mmol/L (ref 22–32)
Calcium: 8.6 mg/dL — ABNORMAL LOW (ref 8.9–10.3)
Chloride: 104 mmol/L (ref 98–111)
Creatinine, Ser: 1.36 mg/dL — ABNORMAL HIGH (ref 0.44–1.00)
GFR calc non Af Amer: 42 mL/min — ABNORMAL LOW (ref >60)
GFR, EST AFRICAN AMERICAN: 49 mL/min — AB (ref >60)
Glucose, Bld: 85 mg/dL (ref 70–99)
POTASSIUM: 3.8 mmol/L (ref 3.5–5.1)
Phosphorus: 3.6 mg/dL (ref 2.5–4.6)
Sodium: 138 mmol/L (ref 135–145)

## 2017-12-11 MED ORDER — SPIRONOLACTONE 25 MG PO TABS
50.0000 mg | ORAL_TABLET | Freq: Every day | ORAL | Status: DC
  Administered 2017-12-12: 50 mg via ORAL
  Filled 2017-12-11: qty 2

## 2017-12-11 MED ORDER — FUROSEMIDE 20 MG PO TABS
20.0000 mg | ORAL_TABLET | Freq: Every day | ORAL | Status: DC
  Administered 2017-12-11 – 2017-12-12 (×2): 20 mg via ORAL
  Filled 2017-12-11 (×2): qty 1

## 2017-12-11 NOTE — Progress Notes (Signed)
PROGRESS NOTE    [name]  NIO:[id number] DOB: 11-19-1955 DOA: 12/05/2017 PCP: Josetta Huddle, MD   Brief Narrative: Patient is a 62 year old female with past medical history of multiple medical allergies who presented on 12/1 with symptoms of acute angioedema.  She was on Cozaar at home.  She required intubation to protect her airway and was admitted under PCCM service.  Patient was transferred to hospitalist service on 12/3.  Her hospital course was remarkable for uncontrolled hypertension despite being on maximum dose of antihypertensives.  Nephrology consulted.  Currently blood pressure is more stable.  Sent metanephrines, catecholamines, renin /aldosterone panels.   Assessment & Plan:   Active Problems:   Airway compromise   Angioedema   Acute respiratory failure with hypoxemia (HCC)   Essential hypertension   Angioedema with upper airway obstruction: Status post intubation and extubation.  Suspected from ARB.  She was on Cozaar , hydrochlorothiazide at home.  Avoid ARB/ACE inhibitors and chlorthalidone.  Steroids being weaned.  Currently vitals are stable.  Malignant hypertension: Currently on clonidine patch, amlodipine, hydralazine, spironolactone.  Nephrology consulted. Awaiting metanephrines, catecholamines, renin /aldosterone panels.  Hypertension could be contributed to steroids.  Steroids being weaned.  CKD stage III: Most likely secondary to uncontrolled hypertension.  We will continue to follow BMP.  Sinus tachycardia: Alternating with bradycardia and tachy.  Currently heart rate fluctuating 100s  Reflux: Continue Pepcid  Hypokalemia: Corrected  Leukocytosis: Most likely from steroids.  We will continue to monitor.  Chronic multiple renal cyst: Renal ultrasound revealed bilateral simple appearing cyst, the largest 8 cm.  No hydronephrosis.  Nephrology following  Hyperlipidemia: LDL of 151.  Continue Lipitor          DVT prophylaxis:[place] Code  Status: Full Family Communication: Family member present at the bedside Disposition Plan: Home after nephrology clearance   Consultants: Nephrology  Procedures: None  Antimicrobials: None  Subjective: Patient seen and examined the bedside this afternoon.  Complains of intermittent palpitations.  Denies any chest pain.  Currently her blood pressure stable.  Heart rate is in the range of 90-100  Objective: Vitals:   12/11/17 0041 12/11/17 0400 12/11/17 0826 12/11/17 1142  BP: 115/73 122/81 126/70 120/65  Pulse: 67 70 88 93  Resp: 18 18 18 18   Temp: 98.1 F (36.7 C)  98.7 F (37.1 C) 98 F (36.7 C)  TempSrc: Oral  Oral Oral  SpO2: 98% 99% 95% 95%  Weight: 110.6 kg     Height: 5\' 9"  (1.753 m)      No intake or output data in the 24 hours ending 12/11/17 1241 Filed Weights   12/09/17 0500 12/10/17 0500 12/11/17 0041  Weight: 110.6 kg 111.2 kg 110.6 kg    Examination:  General exam: Appears calm and comfortable ,Not in distress,obese HEENT:PERRL,Oral mucosa moist, Ear/Nose normal on gross exam Respiratory system: Bilateral equal air entry, normal vesicular breath sounds, no wheezes or crackles  Cardiovascular system: ,Sinus tachy,S1 & S2 heard, RRR. No JVD, murmurs, rubs, gallops or clicks. No pedal edema. Gastrointestinal system: Abdomen is nondistended, soft and nontender. No organomegaly or masses felt. Normal bowel sounds heard. Central nervous system: Alert and oriented. No focal neurological deficits. Extremities: No edema, no clubbing ,no cyanosis, distal peripheral pulses palpable. Skin: No rashes, lesions or ulcers,no icterus ,no pallor MSK: Normal muscle bulk,tone ,power Psychiatry: Judgement and insight appear normal. Mood & affect appropriate.     Data Reviewed: I have personally reviewed following labs and imaging studies  CBC:  Recent Labs  Lab 12/05/17 1133 12/06/17 0218 12/07/17 0437 12/08/17 0741  WBC 13.2* 13.1* 20.1* 16.3*  NEUTROABS 7.8*  --    --  13.6*  HGB 14.1 13.9 13.1 12.6  HCT 43.8 42.8 39.8 38.0  MCV 85.2 84.3 84.9 83.9  PLT 333 282 264 539   Basic Metabolic Panel: Recent Labs  Lab 12/05/17 1133 12/06/17 0218 12/07/17 0437 12/08/17 0741 12/09/17 0259 12/10/17 0505 12/11/17 0533  NA 137 138 141 142 139 141 138  K 2.7* 3.2* 3.4* 3.5 3.2* 3.6 3.8  CL 102 103 107 108 107 108 104  CO2 23 22 22 25 23 24 25   GLUCOSE 118* 162* 136* 119* 162* 92 85  BUN 16 13 16 22  24* 28* 21  CREATININE 1.25* 1.08* 1.25* 1.19* 1.04* 1.26* 1.36*  CALCIUM 9.0 9.0 8.5* 8.5* 8.4* 8.7* 8.6*  MG 1.9 1.9  --  2.2  --   --   --   PHOS 2.1* 2.7  --   --   --  3.1 3.6   GFR: Estimated Creatinine Clearance: 57.6 mL/min (A) (by C-G formula based on SCr of 1.36 mg/dL (H)). Liver Function Tests: Recent Labs  Lab 12/05/17 1133 12/10/17 0505 12/11/17 0533  AST 34  --   --   ALT 35  --   --   ALKPHOS 105  --   --   BILITOT 0.8  --   --   PROT 7.2  --   --   ALBUMIN 3.8 2.8* 2.7*   No results for input(s): LIPASE, AMYLASE in the last 168 hours. No results for input(s): AMMONIA in the last 168 hours. Coagulation Profile: No results for input(s): INR, PROTIME in the last 168 hours. Cardiac Enzymes: Recent Labs  Lab 12/05/17 1133 12/05/17 1825 12/06/17 0218  TROPONINI <0.03 0.06* 0.04*   BNP (last 3 results) No results for input(s): PROBNP in the last 8760 hours. HbA1C: Recent Labs    12/08/17 2210  HGBA1C 5.7*   CBG: No results for input(s): GLUCAP in the last 168 hours. Lipid Profile: Recent Labs    12/08/17 2210  CHOL 203*  HDL 31*  LDLCALC 151*  TRIG 104  CHOLHDL 6.5   Thyroid Function Tests: Recent Labs    12/08/17 2210  FREET4 0.95   Anemia Panel: No results for input(s): VITAMINB12, FOLATE, FERRITIN, TIBC, IRON, RETICCTPCT in the last 72 hours. Sepsis Labs: Recent Labs  Lab 12/05/17 1133 12/05/17 1403  PROCALCITON <0.10  --   LATICACIDVEN 2.2* 1.7    Recent Results (from the past 240 hour(s))    MRSA PCR Screening     Status: None   Collection Time: 12/05/17 12:58 PM  Result Value Ref Range Status   MRSA by PCR NEGATIVE NEGATIVE Final    Comment:        The GeneXpert MRSA Assay (FDA approved for NASAL specimens only), is one component of a comprehensive MRSA colonization surveillance program. It is not intended to diagnose MRSA infection nor to guide or monitor treatment for MRSA infections. Performed at [facility], [place] [address], [place], [place] [address]          Radiology Studies: Mr Jodene Nam Abdomen W Wo Contrast  Result Date: 12/10/2017 CLINICAL DATA:  Uncontrolled hypertension EXAM: MRA ABDOMEN WITH CONTRAST TECHNIQUE: Multiplanar, multiecho pulse sequences of the abdomen were obtained with intravenous contrast. Angiographic images of abdomen were obtained using MRA technique with intravenous contrast. CONTRAST:  7.5 cc Gadavist COMPARISON:  None. FINDINGS: Vascular: Aorta is nonaneurysmal and patent. Celiac is patent. SMA origin is patent. There is mild narrowing just beyond the origin. Single renal arteries are widely patent. There is no convincing evidence of fibromuscular dysplasia. IMA is diminutive and patent. Bilateral common iliac, external iliac, and internal iliac arteries are patent. Nonvascular: Liver is unremarkable.  Postcholecystectomy. Pancreas and spleen are within normal limits. Several benign appearing cysts are scattered throughout the kidneys. Adrenal glands are within normal limits. Visualized bowel is within normal limits. No free fluid.  No abnormal retroperitoneal adenopathy. IMPRESSION: No evidence of renal artery stenosis. Electronically Signed   By: Marybelle Killings M.D.   On: 12/10/2017 07:40        Scheduled Meds: . atorvastatin  40 mg Oral q1800  . cloNIDine  0.3 mg Transdermal Weekly  . famotidine  20 mg Oral BID  . furosemide  20 mg Oral Daily  . heparin  5,000 Units Subcutaneous Q8H  . hydrALAZINE  100 mg Oral Q8H  . mouth  rinse  15 mL Mouth Rinse BID  . metoprolol tartrate  25 mg Oral BID  . pantoprazole  40 mg Oral BID AC  . [START ON 12/12/2017] predniSONE  10 mg Oral Q breakfast  . [START ON 12/12/2017] spironolactone  50 mg Oral Daily   Continuous Infusions:   LOS: 6 days    Time spent: 35 mins.More than 50% of that time was spent in counseling and/or coordination of care.      Shelly Coss, MD [facility] Pager [phone]  If 7PM-7AM, please contact night-coverage [email or web address] Password [email or web address] 12/11/2017, 12:41 PM

## 2017-12-11 NOTE — Progress Notes (Signed)
[facility] Progress Note    Assessment/ Plan:   1. Labile HTN- she gives a history of HTN for at least 6 years and has had difficult to control HTN worsening over the past few months and has had multiple changes to medications due to adverse events/intolerances.  The prednisone is likely exacerbating her HTN and would recommend to titrate as quickly as possible to help assist in BP management--> down to 10 mg daily tomorrow.  She will also need workup for secondary HTN:  renin/aldo, catecholamines, metanephrines are pending.  Renin/ aldo taken after aldactone started yesterday so may falsely suppress results.  AM cortisol low at 1.9 which is expected in the setting of pred use, could repeat after pred tapered (would wait at least 48-72 hours after last pred dose to repeat).  MRA of renal arteries to r/o RAS/FMD as a potential contributor--> this was negative.  metoprolol 25 mg bid to meds added, good response.  Will add Lasix 20 mg daily today and keep aldactone at 50 mg daily (100 mg daily was ordered for today but decreased to 50 so lasix added).  Also on clonidine patch and hydralazine 100 mg TID.    2. Renal cysts- seen on previous studies.  No family history of PKD  3. Hypokalemia- replete and follow  4. CKD stage 3 presumably due to HTN, stable but instructed her to avoid NSAIDs which will also contribute to poorly controlled HTN and CKD.  5. arythmias- she alternates between bradycardia and tachycardia.  6. Angioedema- resolved and appreciate PCCM care but would recommend rapid taper from prednisone given BP issues.  Subjective:    BP better controlled.      Objective:   BP 126/70 (BP Location: Left Arm)   Pulse 88   Temp 98.7 F (37.1 C) (Oral)   Resp 18   Ht 5\' 9"  (1.753 m)   Wt 110.6 kg   LMP 07/05/2009   SpO2 95%   BMI 36.01 kg/m  No intake or output data in the 24 hours ending 12/11/17 1045 Weight change: -0.6 kg  Physical Exam: Gen: alert, lying in  bed, NAD CVS: RRR no m/r/g Resp: clear bilaterally Abd: obese without bruits Ext: trace LE edema  Imaging: Mr Jodene Nam Abdomen W Wo Contrast  Result Date: 12/10/2017 CLINICAL DATA:  Uncontrolled hypertension EXAM: MRA ABDOMEN WITH CONTRAST TECHNIQUE: Multiplanar, multiecho pulse sequences of the abdomen were obtained with intravenous contrast. Angiographic images of abdomen were obtained using MRA technique with intravenous contrast. CONTRAST:  7.5 cc Gadavist COMPARISON:  None. FINDINGS: Vascular: Aorta is nonaneurysmal and patent. Celiac is patent. SMA origin is patent. There is mild narrowing just beyond the origin. Single renal arteries are widely patent. There is no convincing evidence of fibromuscular dysplasia. IMA is diminutive and patent. Bilateral common iliac, external iliac, and internal iliac arteries are patent. Nonvascular: Liver is unremarkable.  Postcholecystectomy. Pancreas and spleen are within normal limits. Several benign appearing cysts are scattered throughout the kidneys. Adrenal glands are within normal limits. Visualized bowel is within normal limits. No free fluid.  No abnormal retroperitoneal adenopathy. IMPRESSION: No evidence of renal artery stenosis. Electronically Signed   By: Marybelle Killings M.D.   On: 12/10/2017 07:40    Labs: BMET Recent Labs  Lab 12/05/17 1133 12/06/17 0218 12/07/17 0437 12/08/17 0741 12/09/17 0259 12/10/17 0505 12/11/17 0533  NA 137 138 141 142 139 141 138  K 2.7* 3.2* 3.4* 3.5 3.2* 3.6 3.8  CL 102 103 107 108  107 108 104  CO2 23 22 22 25 23 24 25   GLUCOSE 118* 162* 136* 119* 162* 92 85  BUN 16 13 16 22  24* 28* 21  CREATININE 1.25* 1.08* 1.25* 1.19* 1.04* 1.26* 1.36*  CALCIUM 9.0 9.0 8.5* 8.5* 8.4* 8.7* 8.6*  PHOS 2.1* 2.7  --   --   --  3.1 3.6   CBC Recent Labs  Lab 12/05/17 1133 12/06/17 0218 12/07/17 0437 12/08/17 0741  WBC 13.2* 13.1* 20.1* 16.3*  NEUTROABS 7.8*  --   --  13.6*  HGB 14.1 13.9 13.1 12.6  HCT 43.8 42.8 39.8  38.0  MCV 85.2 84.3 84.9 83.9  PLT 333 282 264 263    Medications:    . atorvastatin  40 mg Oral q1800  . cloNIDine  0.3 mg Transdermal Weekly  . famotidine  20 mg Oral BID  . furosemide  20 mg Oral Daily  . heparin  5,000 Units Subcutaneous Q8H  . hydrALAZINE  100 mg Oral Q8H  . mouth rinse  15 mL Mouth Rinse BID  . metoprolol tartrate  25 mg Oral BID  . pantoprazole  40 mg Oral BID AC  . [START ON 12/12/2017] predniSONE  10 mg Oral Q breakfast  . [START ON 12/12/2017] spironolactone  50 mg Oral Daily      Madelon Lips, MD 12/11/2017, 10:45 AM

## 2017-12-12 LAB — BASIC METABOLIC PANEL
Anion gap: 10 (ref 5–15)
BUN: 18 mg/dL (ref 8–23)
CO2: 25 mmol/L (ref 22–32)
Calcium: 8.6 mg/dL — ABNORMAL LOW (ref 8.9–10.3)
Chloride: 105 mmol/L (ref 98–111)
Creatinine, Ser: 1.21 mg/dL — ABNORMAL HIGH (ref 0.44–1.00)
GFR calc Af Amer: 56 mL/min — ABNORMAL LOW (ref >60)
GFR calc non Af Amer: 48 mL/min — ABNORMAL LOW (ref >60)
Glucose, Bld: 106 mg/dL — ABNORMAL HIGH (ref 70–99)
Potassium: 3.3 mmol/L — ABNORMAL LOW (ref 3.5–5.1)
Sodium: 140 mmol/L (ref 135–145)

## 2017-12-12 LAB — CBC WITH DIFFERENTIAL/PLATELET
Abs Immature Granulocytes: 0.21 10*3/uL — ABNORMAL HIGH (ref 0.00–0.07)
Basophils Absolute: 0 10*3/uL (ref 0.0–0.1)
Basophils Relative: 0 %
Eosinophils Absolute: 0.5 10*3/uL (ref 0.0–0.5)
Eosinophils Relative: 2 %
HCT: 43.2 % (ref 36.0–46.0)
Hemoglobin: 14.2 g/dL (ref 12.0–15.0)
Immature Granulocytes: 1 %
Lymphocytes Relative: 27 %
Lymphs Abs: 5.5 10*3/uL — ABNORMAL HIGH (ref 0.7–4.0)
MCH: 27.7 pg (ref 26.0–34.0)
MCHC: 32.9 g/dL (ref 30.0–36.0)
MCV: 84.4 fL (ref 80.0–100.0)
MONO ABS: 1.8 10*3/uL — AB (ref 0.1–1.0)
Monocytes Relative: 9 %
Neutro Abs: 12.3 10*3/uL — ABNORMAL HIGH (ref 1.7–7.7)
Neutrophils Relative %: 61 %
Platelets: 258 10*3/uL (ref 150–400)
RBC: 5.12 MIL/uL — ABNORMAL HIGH (ref 3.87–5.11)
RDW: 14.8 % (ref 11.5–15.5)
WBC: 20.4 10*3/uL — ABNORMAL HIGH (ref 4.0–10.5)
nRBC: 0 % (ref 0.0–0.2)

## 2017-12-12 MED ORDER — METOPROLOL TARTRATE 50 MG PO TABS: 50.0000 mg | ORAL_TABLET | Freq: Two times a day (BID) | ORAL | 0 refills | Status: DC

## 2017-12-12 MED ORDER — METOPROLOL TARTRATE 50 MG PO TABS: 50.0000 mg | ORAL_TABLET | Freq: Two times a day (BID) | ORAL | Status: DC

## 2017-12-12 MED ORDER — HYDRALAZINE HCL 100 MG PO TABS: 100.0000 mg | ORAL_TABLET | Freq: Three times a day (TID) | ORAL | 0 refills | Status: DC

## 2017-12-12 MED ORDER — FUROSEMIDE 20 MG PO TABS: 20.0000 mg | ORAL_TABLET | Freq: Every day | ORAL | 0 refills | Status: DC

## 2017-12-12 MED ORDER — METOPROLOL TARTRATE 25 MG PO TABS: 25.0000 mg | ORAL_TABLET | Freq: Two times a day (BID) | ORAL | 0 refills | Status: DC

## 2017-12-12 MED ORDER — FAMOTIDINE 20 MG PO TABS: 20.0000 mg | ORAL_TABLET | Freq: Two times a day (BID) | ORAL | 0 refills | Status: AC

## 2017-12-12 MED ORDER — CLONIDINE 0.3 MG/24HR TD PTWK: 0.3000 mg | MEDICATED_PATCH | TRANSDERMAL | 0 refills | Status: DC

## 2017-12-12 MED ORDER — HYDRALAZINE HCL 25 MG PO TABS: 25.0000 mg | ORAL_TABLET | Freq: Three times a day (TID) | ORAL | Status: DC

## 2017-12-12 MED ORDER — SPIRONOLACTONE 100 MG PO TABS: 100.0000 mg | ORAL_TABLET | Freq: Every day | ORAL | 0 refills | Status: DC

## 2017-12-12 MED ORDER — DIPHENHYDRAMINE-ZINC ACETATE 2-0.1 % EX CREA: 1.0000 "application " | TOPICAL_CREAM | Freq: Three times a day (TID) | CUTANEOUS | 0 refills | Status: AC | PRN

## 2017-12-12 MED ORDER — SPIRONOLACTONE 50 MG PO TABS: 50.0000 mg | ORAL_TABLET | Freq: Every day | ORAL | 0 refills | Status: DC

## 2017-12-12 MED ORDER — SPIRONOLACTONE 25 MG PO TABS: 100.0000 mg | ORAL_TABLET | Freq: Every day | ORAL | Status: DC

## 2017-12-12 MED ORDER — ATORVASTATIN CALCIUM 40 MG PO TABS: 40.0000 mg | ORAL_TABLET | Freq: Every day | ORAL | 0 refills | Status: DC

## 2017-12-12 MED ORDER — DIPHENHYDRAMINE-ZINC ACETATE 2-0.1 % EX CREA: 1.0000 "application " | TOPICAL_CREAM | Freq: Three times a day (TID) | CUTANEOUS | 0 refills | Status: DC | PRN

## 2017-12-12 NOTE — Progress Notes (Signed)
[facility] Progress Note    Assessment/ Plan:   1. Labile HTN- she gives a history of HTN for at least 6 years and has had difficult to control HTN worsening over the past few months and has had multiple changes to medications due to adverse events/intolerances.  The prednisone is likely exacerbating her HTN and would recommend to titrate as quickly as possible to help assist in BP management--> down to 10 mg daily.  She will also need workup for secondary HTN:  renin/aldo, catecholamines, metanephrines are pending.  Renin/ aldo taken after aldactone started so may falsely suppress results.  AM cortisol low at 1.9 which is expected in the setting of pred use, could repeat after pred tapered (would wait at least 48-72 hours after last pred dose to repeat).  MRA of renal arteries to r/o RAS/FMD as a potential contributor--> this was negative.  Pt now reporting rash with hydralazine.  Will stop hydralazine, increase metop to 50 BID, increase aldactone to 100 mg daily, Lasix 20 mg daily, continue clonidine patch.  I will arrange bloodwork and f/u at my office for pt.  D/w primary   2. Renal cysts- seen on previous studies.  No family history of PKD  3. Hypokalemia- replete and follow  4. CKD stage 3 presumably due to HTN, stable but instructed her to avoid NSAIDs which will also contribute to poorly controlled HTN and CKD.  5. arythmias- she alternates between bradycardia and tachycardia.  6. Angioedema- resolved; rapid taper from prednisone given BP issues.  Subjective:    BP excellently controlled; however she reports that every time she takes hydralazine, her hands become red and itchy, not really helped by benadryl.     Objective:   BP 133/84 (BP Location: Left Arm)   Pulse 79   Temp 98.4 F (36.9 C) (Oral)   Resp 20   Ht 5\' 9"  (1.753 m)   Wt 108.6 kg   LMP 07/05/2009   SpO2 95%   BMI 35.36 kg/m   Intake/Output Summary (Last 24 hours) at 12/12/2017 1312 Last data  filed at 12/11/2017 1900 Gross per 24 hour  Intake 240 ml  Output -  Net 240 ml   Weight change: -2 kg  Physical Exam: Gen: alert, sitting in bed NAD CVS: RRR no m/r/g Resp: clear bilaterally Abd: obese without bruits Ext: trace LE edema  Imaging: No results found.  Labs: BMET Recent Labs  Lab 12/06/17 0218 12/07/17 0437 12/08/17 0741 12/09/17 0259 12/10/17 0505 12/11/17 0533 12/12/17 0607  NA 138 141 142 139 141 138 140  K 3.2* 3.4* 3.5 3.2* 3.6 3.8 3.3*  CL 103 107 108 107 108 104 105  CO2 22 22 25 23 24 25 25   GLUCOSE 162* 136* 119* 162* 92 85 106*  BUN 13 16 22  24* 28* 21 18  CREATININE 1.08* 1.25* 1.19* 1.04* 1.26* 1.36* 1.21*  CALCIUM 9.0 8.5* 8.5* 8.4* 8.7* 8.6* 8.6*  PHOS 2.7  --   --   --  3.1 3.6  --    CBC Recent Labs  Lab 12/06/17 0218 12/07/17 0437 12/08/17 0741 12/12/17 0607  WBC 13.1* 20.1* 16.3* 20.4*  NEUTROABS  --   --  13.6* 12.3*  HGB 13.9 13.1 12.6 14.2  HCT 42.8 39.8 38.0 43.2  MCV 84.3 84.9 83.9 84.4  PLT 282 264 263 258    Medications:    . atorvastatin  40 mg Oral q1800  . cloNIDine  0.3 mg Transdermal Weekly  .  famotidine  20 mg Oral BID  . furosemide  20 mg Oral Daily  . heparin  5,000 Units Subcutaneous Q8H  . mouth rinse  15 mL Mouth Rinse BID  . metoprolol tartrate  50 mg Oral BID  . pantoprazole  40 mg Oral BID AC  . [START ON 12/13/2017] spironolactone  100 mg Oral Daily      Madelon Lips, MD 12/12/2017, 1:12 PM

## 2017-12-12 NOTE — Progress Notes (Signed)
Pt being discharged from hospital per orders from MD. Pt educated on discharge instructions. Pt verbalized understanding of instructions. All questions and concerns were addressed. Pt's IV was removed prior to discharge. Pt exited hospital via wheelchair accompanied by staff. 

## 2017-12-12 NOTE — Discharge Summary (Addendum)
Physician Discharge Summary  [name] [name]:[id number] DOB: [birth year] DOA: 12/05/2017  PCP: Josetta Huddle, MD  Admit date: 12/05/2017 Discharge date: 12/12/2017  Admitted From: Home Disposition:  Home  Discharge Condition:Stable CODE STATUS:FULL Diet recommendation: Heart Healthy  Brief/Interim Summary: Patient is a 62 year old female with past medical history of multiple medical allergies who presented on 12/1 with symptoms of acute angioedema.  She was on Cozaar at home.  She required intubation to protect her airway and was admitted under PCCM service.  Patient was transferred to hospitalist service on 12/3.  Her hospital course was remarkable for uncontrolled hypertension despite being on maximum dose of antihypertensives.  Nephrology consulted.  Currently blood pressure is  stable.  Sent metanephrines, catecholamines, renin /aldosterone panels which are pending.  She is hemodynamically stable for discharge to home today.  Medication adjusted.  Following problems were addressed during hospitalization:   Angioedema with upper airway obstruction: Status post intubation and extubation.  Suspected from ARB.  She was on Cozaar , hydrochlorothiazide at home.  Avoid ARB/ACE inhibitors and chlorthalidone.  Steroids stopped.  Currently vitals are stable.  Continue Pepcid for a week.  Malignant hypertension: Currently on clonidine patch, amlodipine, hydralazine, spironolactone,lasix.  Nephrology was consulted. Awaiting metanephrines, catecholamines, renin /aldosterone panels.    And her blood pressure is stable.  She will be discharged home on current regimen.  She will called by nephrology as an outpatient if any abnormality found  in the above work-up sent.  CKD stage III: Most likely secondary to uncontrolled hypertension.  Check BMP in a week.  Sinus tachycardia: Much stable now  Reflux: Continue Pepcid  Hypokalemia: Corrected  Leukocytosis: Most likely from steroids. Check  CBC in a week.  Chronic multiple renal cyst: Renal ultrasound revealed bilateral simple appearing cyst, the largest 8 cm.  No hydronephrosis.  Nephrology was following  Hyperlipidemia: LDL of 151.  Started on  Lipitor   Discharge Diagnoses:  Active Problems:   Airway compromise   Angioedema   Acute respiratory failure with hypoxemia [facility])   Essential hypertension    Discharge Instructions  Discharge Instructions    Diet - low sodium heart healthy   Complete by:  As directed    Discharge instructions   Complete by:  As directed    1)Please prescribe medication as instructed. 2)Follow up with your PCP in a week.  Do a BMP,CBC  tests during the follow up.Continue to monitor your blood pressure at home.   Increase activity slowly   Complete by:  As directed      Allergies as of 12/12/2017      Reactions   Ace Inhibitors    Angioedema    Chlorthalidone    Angioedema    Crab [shellfish Allergy] Anaphylaxis   Edarbi [azilsartan] Hypertension   Ivp Dye [iodinated Diagnostic Agents] Anaphylaxis   Losartan    Angioedema    Nizatidine Anaphylaxis   Tolerates famotidine and cimetidine   Dilaudid [hydromorphone Hcl] Nausea And Vomiting   Pt can tolerate morphine   Hydrocodone-acetaminophen Nausea And Vomiting   Oxycodone-acetaminophen Nausea And Vomiting      Medication List    STOP taking these medications   azithromycin 250 MG tablet Commonly known as:  ZITHROMAX   chlorthalidone 25 MG tablet Commonly known as:  HYGROTON   cloNIDine 0.2 MG tablet Commonly known as:  CATAPRES   metoprolol succinate 25 MG 24 hr tablet Commonly known as:  TOPROL-XL   predniSONE 10 MG (21) Tbpk tablet Commonly known  as:  STERAPRED UNI-PAK 21 TAB   TAGAMET HB PO     TAKE these medications   albuterol 108 (90 Base) MCG/ACT inhaler Commonly known as:  PROVENTIL HFA;VENTOLIN HFA Inhale 1-2 puffs into the lungs every 6 (six) hours as needed for wheezing or shortness of breath.    atorvastatin 40 MG tablet Commonly known as:  LIPITOR Take 1 tablet (40 mg total) by mouth daily at 6 PM.   CALCIUM 1200+D3 600-40-500 MG-MG-UNIT Tb24 Generic drug:  Calcium-Magnesium-Vitamin D Take 3 tablets by mouth daily.   cloNIDine 0.3 mg/24hr patch Commonly known as:  CATAPRES - Dosed in mg/24 hr Place 1 patch (0.3 mg total) onto the skin once a week. Start taking on:  12/15/2017   diclofenac sodium 1 % Gel Commonly known as:  VOLTAREN Apply 2 g topically 3 (three) times daily. To affected joint   diphenhydrAMINE 25 mg capsule Commonly known as:  BENADRYL Take 25 mg by mouth every 6 (six) hours as needed.   diphenhydrAMINE-zinc acetate cream Commonly known as:  BENADRYL Apply 1 application topically 3 (three) times daily as needed for itching (Apply on hands).   esomeprazole 40 MG capsule Commonly known as:  NEXIUM Take 40 mg by mouth daily.   famotidine 20 MG tablet Commonly known as:  PEPCID Take 1 tablet (20 mg total) by mouth 2 (two) times daily.   furosemide 20 MG tablet Commonly known as:  LASIX Take 1 tablet (20 mg total) by mouth daily. Start taking on:  12/13/2017   ibuprofen 800 MG tablet Commonly known as:  ADVIL,MOTRIN Take 800 mg by mouth every 12 (twelve) hours as needed for moderate pain. Every 12 hours prn   metoprolol tartrate 50 MG tablet Commonly known as:  LOPRESSOR Take 1 tablet (50 mg total) by mouth 2 (two) times daily.   nitroGLYCERIN 0.4 MG SL tablet Commonly known as:  NITROSTAT Place 1 tablet (0.4 mg total) under the tongue every 5 (five) minutes as needed for chest pain.   SALONPAS EX Apply 1 each topically daily as needed (pain).   spironolactone 100 MG tablet Commonly known as:  ALDACTONE Take 1 tablet (100 mg total) by mouth daily. Start taking on:  12/13/2017      Follow-up Information    Josetta Huddle, MD. Schedule an appointment as soon as possible for a visit in 1 week(s).   Specialty:  Internal Medicine Contact  information: [address] [place] [address] [phone]          Allergies  Allergen Reactions  . Ace Inhibitors     Angioedema   . Chlorthalidone     Angioedema   . Crab [Shellfish Allergy] Anaphylaxis  . Edarbi [Azilsartan] Hypertension  . Ivp Dye [Iodinated Diagnostic Agents] Anaphylaxis  . Losartan     Angioedema   . Nizatidine Anaphylaxis    Tolerates famotidine and cimetidine  . Dilaudid [Hydromorphone Hcl] Nausea And Vomiting    Pt can tolerate morphine  . Hydrocodone-Acetaminophen Nausea And Vomiting  . Oxycodone-Acetaminophen Nausea And Vomiting    Consultations: PCCM, nephrology  Procedures/Studies: Dg Chest 2 View  Result Date: 11/19/2017 CLINICAL DATA:  Pt began a new BP drug this morning and shortly after began having SOB, dizziness, blurred vision, increased BP - hx of htn, GERD, MVP, sleep apnea EXAM: CHEST - 2 VIEW COMPARISON:  10/01/2017 FINDINGS: Heart size is accentuated by AP position of the patient. There are no focal consolidations or pleural effusions. No pulmonary edema.  Mild midthoracic spondylosis. IMPRESSION: No evidence for acute cardiopulmonary abnormality. Electronically Signed   By: Nolon Nations M.D.   On: 11/19/2017 11:21   Ct Head Wo Contrast  Result Date: 11/19/2017 CLINICAL DATA:  Headache with blurred vision. Photophobia. Hypertension EXAM: CT HEAD WITHOUT CONTRAST TECHNIQUE: Contiguous axial images were obtained from the base of the skull through the vertex without intravenous contrast. COMPARISON:  Head CT Jun 01, 2013 and brain MRI June 22, 2013 FINDINGS: Brain: The ventricles are normal in size and configuration. There is no evident intracranial mass, hemorrhage, extra-axial fluid collection, or midline shift. There is mild small vessel disease in the centra semiovale bilaterally. Elsewhere brain parenchyma appears unremarkable. No acute infarct is appreciable. Vascular: No hyperdense vessel.  No evident  vascular calcification. Skull: Bony calvarium appears intact. Sinuses/Orbits: There is a retention cyst in the inferior left maxillary antrum. There is mucosal thickening involving multiple ethmoid air cells. Orbits appear symmetric bilaterally. Other: Visualized mastoid air cells are clear. IMPRESSION: Mild periventricular small vessel disease. No acute infarct. No mass or hemorrhage. Areas of paranasal sinus disease noted. Electronically Signed   By: Lowella Grip III M.D.   On: 11/19/2017 11:32   US Renal  Result Date: 12/08/2017 CLINICAL DATA:  Pyelonephritis. EXAM: RENAL / URINARY TRACT ULTRASOUND COMPLETE COMPARISON:  Body CT 03/24/2017 FINDINGS: Right Kidney: Renal measurements: 11.0 x 5.8 x 5.8 = volume: 195 mL . Echogenicity within normal limits. No hydronephrosis visualized. Three predominant benign-appearing cysts in the upper pole of the right kidney measuring 5.9, 4.5 and 4.4 cm in diameter. Left Kidney: Renal measurements: 12.4 x 6.9 x 6.2 = volume: 276 mL. Echogenicity within normal limits. No hydronephrosis visualized. Three dominant cysts in the left kidney with a large cyst in the left renal pelvis measuring 8.0 x 7.6 x 6.9 cm, and 2 benign-appearing cysts within the cortex of the midpolar region measuring 6.9 and 3.7 cm in diameter. Bladder: Decompressed around urinary Foley. IMPRESSION: No evidence of hydronephrosis bilaterally. Bilateral simple appearing renal cysts. The largest cyst in the midpolar region of the left kidney measures 8 cm. Electronically Signed   By: Fidela Salisbury M.D.   On: 12/08/2017 12:45   Mr Jodene Nam Abdomen W Wo Contrast  Result Date: 12/10/2017 CLINICAL DATA:  Uncontrolled hypertension EXAM: MRA ABDOMEN WITH CONTRAST TECHNIQUE: Multiplanar, multiecho pulse sequences of the abdomen were obtained with intravenous contrast. Angiographic images of abdomen were obtained using MRA technique with intravenous contrast. CONTRAST:  7.5 cc Gadavist COMPARISON:  None.  FINDINGS: Vascular: Aorta is nonaneurysmal and patent. Celiac is patent. SMA origin is patent. There is mild narrowing just beyond the origin. Single renal arteries are widely patent. There is no convincing evidence of fibromuscular dysplasia. IMA is diminutive and patent. Bilateral common iliac, external iliac, and internal iliac arteries are patent. Nonvascular: Liver is unremarkable.  Postcholecystectomy. Pancreas and spleen are within normal limits. Several benign appearing cysts are scattered throughout the kidneys. Adrenal glands are within normal limits. Visualized bowel is within normal limits. No free fluid.  No abnormal retroperitoneal adenopathy. IMPRESSION: No evidence of renal artery stenosis. Electronically Signed   By: Marybelle Killings M.D.   On: 12/10/2017 07:40   Dg Chest Port 1 View  Result Date: 12/05/2017 CLINICAL DATA:  Acute respiratory failure, intubated EXAM: PORTABLE CHEST 1 VIEW COMPARISON:  11/19/2017 chest radiograph. FINDINGS: Endotracheal tube tip is 1.6 cm above the carina. Enteric tube enters stomach with the tip not seen on this image. Stable cardiomediastinal silhouette with  mild cardiomegaly. No pneumothorax. Possible small left pleural effusion. No right pleural effusion. Borderline mild pulmonary edema. IMPRESSION: 1. Endotracheal tube tip is 1.6 cm above the carina, consider retracting 1 cm. Well-positioned enteric tube. 2. Mild cardiomegaly with borderline mild pulmonary edema. Possible small left pleural effusion. Electronically Signed   By: Ilona Sorrel M.D.   On: 12/05/2017 12:09      Subjective: Patient seen and examined the bedside this morning.  Remains very comfortable.  Hemodynamically stable. Stable  for discharge after discussing with nephrology.  Discharge Exam: Vitals:   12/12/17 0730 12/12/17 1142  BP: 120/63 133/84  Pulse: 71 79  Resp: 18 20  Temp: 98.6 F (37 C) 98.4 F (36.9 C)  SpO2: 97% 95%   Vitals:   12/11/17 2331 12/12/17 0309 12/12/17  0730 12/12/17 1142  BP: 131/72 130/70 120/63 133/84  Pulse: 69 75 71 79  Resp: 18 18 18 20   Temp: 98.4 F (36.9 C) 98.6 F (37 C) 98.6 F (37 C) 98.4 F (36.9 C)  TempSrc: Oral Oral Oral Oral  SpO2: 98% 93% 97% 95%  Weight:  108.6 kg    Height:        General: Pt is alert, awake, not in acute distress Cardiovascular: RRR, S1/S2 +, no rubs, no gallops Respiratory: CTA bilaterally, no wheezing, no rhonchi Abdominal: Soft, NT, ND, bowel sounds + Extremities: no edema, no cyanosis    The results of significant diagnostics from this hospitalization (including imaging, microbiology, ancillary and laboratory) are listed below for reference.     Microbiology: Recent Results (from the past 240 hour(s))  MRSA PCR Screening     Status: None   Collection Time: 12/05/17 12:58 PM  Result Value Ref Range Status   MRSA by PCR NEGATIVE NEGATIVE Final    Comment:        The GeneXpert MRSA Assay (FDA approved for NASAL specimens only), is one component of a comprehensive MRSA colonization surveillance program. It is not intended to diagnose MRSA infection nor to guide or monitor treatment for MRSA infections. Performed at [facility], [place] [address], [place], [place] [address]      Labs: BNP (last 3 results) No results for input(s): BNP in the last 8760 hours. Basic Metabolic Panel: Recent Labs  Lab 12/06/17 0218  12/08/17 0741 12/09/17 0259 12/10/17 0505 12/11/17 0533 12/12/17 0607  NA 138   < > 142 139 141 138 140  K 3.2*   < > 3.5 3.2* 3.6 3.8 3.3*  CL 103   < > 108 107 108 104 105  CO2 22   < > 25 23 24 25 25   GLUCOSE 162*   < > 119* 162* 92 85 106*  BUN 13   < > 22 24* 28* 21 18  CREATININE 1.08*   < > 1.19* 1.04* 1.26* 1.36* 1.21*  CALCIUM 9.0   < > 8.5* 8.4* 8.7* 8.6* 8.6*  MG 1.9  --  2.2  --   --   --   --   PHOS 2.7  --   --   --  3.1 3.6  --    < > = values in this interval not displayed.   Liver Function Tests: Recent Labs  Lab 12/10/17 0505  12/11/17 0533  ALBUMIN 2.8* 2.7*   No results for input(s): LIPASE, AMYLASE in the last 168 hours. No results for input(s): AMMONIA in the last 168 hours. CBC: Recent Labs  Lab 12/06/17 0218 12/07/17 0437  12/08/17 0741 12/12/17 0607  WBC 13.1* 20.1* 16.3* 20.4*  NEUTROABS  --   --  13.6* 12.3*  HGB 13.9 13.1 12.6 14.2  HCT 42.8 39.8 38.0 43.2  MCV 84.3 84.9 83.9 84.4  PLT 282 264 263 258   Cardiac Enzymes: Recent Labs  Lab 12/05/17 1825 12/06/17 0218  TROPONINI 0.06* 0.04*   BNP: Invalid input(s): POCBNP CBG: No results for input(s): GLUCAP in the last 168 hours. D-Dimer No results for input(s): DDIMER in the last 72 hours. Hgb A1c No results for input(s): HGBA1C in the last 72 hours. Lipid Profile No results for input(s): CHOL, HDL, LDLCALC, TRIG, CHOLHDL, LDLDIRECT in the last 72 hours. Thyroid function studies No results for input(s): TSH, T4TOTAL, T3FREE, THYROIDAB in the last 72 hours.  Invalid input(s): FREET3 Anemia work up No results for input(s): VITAMINB12, FOLATE, FERRITIN, TIBC, IRON, RETICCTPCT in the last 72 hours. Urinalysis    Component Value Date/Time   COLORURINE STRAW (A) 01/14/2016 0438   APPEARANCEUR CLEAR 01/14/2016 0438   LABSPEC 1.013 01/14/2016 0438   PHURINE 6.0 01/14/2016 0438   GLUCOSEU NEGATIVE 01/14/2016 0438   HGBUR NEGATIVE 01/14/2016 0438   BILIRUBINUR n 03/09/2017 0827   KETONESUR NEGATIVE 01/14/2016 0438   PROTEINUR n 03/09/2017 0827   PROTEINUR NEGATIVE 01/14/2016 0438   UROBILINOGEN negative (A) 03/09/2017 0827   NITRITE n 03/09/2017 0827   NITRITE NEGATIVE 01/14/2016 0438   LEUKOCYTESUR Negative 03/09/2017 0827   Sepsis Labs Invalid input(s): PROCALCITONIN,  WBC,  LACTICIDVEN Microbiology Recent Results (from the past 240 hour(s))  MRSA PCR Screening     Status: None   Collection Time: 12/05/17 12:58 PM  Result Value Ref Range Status   MRSA by PCR NEGATIVE NEGATIVE Final    Comment:        The GeneXpert MRSA  Assay (FDA approved for NASAL specimens only), is one component of a comprehensive MRSA colonization surveillance program. It is not intended to diagnose MRSA infection nor to guide or monitor treatment for MRSA infections. Performed at [facility], [place] [address], [place], [place] [address]     Please note: You were cared for by a hospitalist during your hospital stay. Once you are discharged, your primary care physician will handle any further medical issues. Please note that NO REFILLS for any discharge medications will be authorized once you are discharged, as it is imperative that you return to your primary care physician (or establish a relationship with a primary care physician if you do not have one) for your post hospital discharge needs so that they can reassess your need for medications and monitor your lab values.    Time coordinating discharge: 40 minutes  SIGNED:   Shelly Coss, MD  [facility] 12/12/2017, 12:55 PM Pager [phone]  If 7PM-7AM, please contact night-coverage [email or web address] Password [email or web address]

## 2017-12-13 LAB — METANEPHRINES, PLASMA
Metanephrine, Free: 24 pg/mL (ref 0–62)
Normetanephrine, Free: 26 pg/mL (ref 0–145)

## 2017-12-13 MED FILL — cloNIDine 0.3 MG/24HR PTWK: 0.3 | 28 days supply | Qty: 4 | Fill #0

## 2017-12-13 MED FILL — SPIRONOLACTONE 100 MG TAB: 100 | 30 days supply | Qty: 30 | Fill #0

## 2017-12-13 MED FILL — FUROSEMIDE 20 MG TABS: 20 | 30 days supply | Qty: 30 | Fill #0

## 2017-12-14 ENCOUNTER — Other Ambulatory Visit: Payer: Self-pay | Admitting: *Deleted

## 2017-12-14 ENCOUNTER — Encounter: Payer: Self-pay | Admitting: *Deleted

## 2017-12-14 LAB — CATECHOLAMINES, FRACTIONATED, PLASMA
Dopamine: <30 pg/mL (ref 0–48)
Epinephrine: <15 pg/mL (ref 0–62)
Norepinephrine: 154 pg/mL (ref 0–874)

## 2017-12-14 NOTE — Patient Outreach (Addendum)
[facility]) Care Management  12/14/2017  Cadence Minton Naill [birth year] [id number]   Subjective: Telephone call to patient's home number, spoke with patient, and HIPAA verified.  Discussed [facility] Care Management [facility] Transition of care follow up, patient voiced understanding, and is in agreement to follow up.   Patient states she is doing okay, hanging in there, blood pressure average remains 159/97, taking blood pressure twice daily, has lost 10 lbs, monitoring weight daily, Catapres applied at hospital, and will apply new patch in 7 days.  States she is recording blood pressures,  will share readings with nephrologist,  and primary MD as needed.   States she will have blood drawn at nephrologist office on 12/15/17 and nephrologist office will call to schedule follow up appointment once results are back.   Patient states she has a follow up appointment with primary MD on 12/23/17 which was the first available appointment.  Patient states she is able to manage self care and has assistance as needed.  Patient voices understanding of medical diagnosis and treatment plan.   States she is accessing the following [facility] benefits: outpatient pharmacy, hospital indemnity (verbally given contact number for [employer] [phone], will file claim if appropriate, verbally given contact number for [employer] [phone] to request itemized bill), short term / long term disability (verbally given contact number for [employer] disability [phone]),  and has started family medical leave act (FMLA) process.  Patient aware she may need to obtain copy of all FMLA paperwork for her records and to follow up to verify paperwork completed in a timely fashion. Patient verbally given [facility] Benefit Specialist contact number [phone]), given [facility] contact number [phone]), patient voices understanding, and states she will follow up regarding her benefit  questions.  Patient states she does not have any education material, transition of care, care coordination, disease management, disease monitoring, transportation, community resource, or pharmacy needs at this time.  Discussed hypertension disease management resources for [employer], patient voiced understanding,  and states she will follow up as needed.   Discussed [facility] Employee Nutritional Counseling resources through [place] [facility], patient voices understanding, and will follow up if appropriate.   States she is very appreciative of the follow up and is in agreement to receive [facility] Management information.      Objective: Per KPN (Knowledge Performance Now, point of care tool) and chart review, patient hospitalized 12/05/17 -12/22/17 for Acute angioedema with upper airway obstruction.    Patient also has a history of malignant hypertension,  Chronic kidney disease stage 3 secondary to uncontrolled hypertension, hyperlipidemia, Lactose intolerance, MVP (mitral valve prolapse), Pyelonephritis,Sleep apnea, Spinal stenosis, and multiple renal cyst.     Assessment: Received [facility] Transition of care referral on 12/06/17.   Transition of care follow up completed, no care management needs, and will proceed with case closure.      Plan: RNCM will send patient successful outreach letter, [facility] pamphlet, and magnet. RNCM will complete case closure due to follow up completed / no care management needs.       Anetha Slagel H. Annia Friendly, BSN, [place] [facility] Telephonic CM Phone: [phone] Fax: [phone]

## 2017-12-15 ENCOUNTER — Observation Stay (HOSPITAL_COMMUNITY)
Admission: EM | Admit: 2017-12-15 | Discharge: 2017-12-16 | Disposition: A | Discharge status: Home / Self Care | Payer: 59 | Attending: Internal Medicine | Admitting: Internal Medicine

## 2017-12-15 ENCOUNTER — Inpatient Hospital Stay (HOSPITAL_COMMUNITY): Payer: 59

## 2017-12-15 ENCOUNTER — Other Ambulatory Visit: Payer: Self-pay

## 2017-12-15 ENCOUNTER — Emergency Department (HOSPITAL_COMMUNITY): Payer: 59

## 2017-12-15 ENCOUNTER — Encounter (HOSPITAL_COMMUNITY): Payer: Self-pay | Admitting: *Deleted

## 2017-12-15 DIAGNOSIS — N183 Chronic kidney disease, stage 3 unspecified: Secondary | ICD-10-CM

## 2017-12-15 DIAGNOSIS — Q6102 Congenital multiple renal cysts: Secondary | ICD-10-CM

## 2017-12-15 DIAGNOSIS — G4730 Sleep apnea, unspecified: Secondary | ICD-10-CM | POA: Insufficient documentation

## 2017-12-15 DIAGNOSIS — Z9049 Acquired absence of other specified parts of digestive tract: Secondary | ICD-10-CM | POA: Diagnosis not present

## 2017-12-15 DIAGNOSIS — I341 Nonrheumatic mitral (valve) prolapse: Secondary | ICD-10-CM | POA: Diagnosis not present

## 2017-12-15 DIAGNOSIS — K219 Gastro-esophageal reflux disease without esophagitis: Secondary | ICD-10-CM | POA: Insufficient documentation

## 2017-12-15 DIAGNOSIS — R740 Nonspecific elevation of levels of transaminase and lactic acid dehydrogenase [LDH]: Secondary | ICD-10-CM | POA: Diagnosis not present

## 2017-12-15 DIAGNOSIS — R188 Other ascites: Secondary | ICD-10-CM | POA: Diagnosis not present

## 2017-12-15 DIAGNOSIS — N281 Cyst of kidney, acquired: Secondary | ICD-10-CM | POA: Insufficient documentation

## 2017-12-15 DIAGNOSIS — F419 Anxiety disorder, unspecified: Secondary | ICD-10-CM | POA: Insufficient documentation

## 2017-12-15 DIAGNOSIS — I129 Hypertensive chronic kidney disease with stage 1 through stage 4 chronic kidney disease, or unspecified chronic kidney disease: Secondary | ICD-10-CM | POA: Insufficient documentation

## 2017-12-15 DIAGNOSIS — E785 Hyperlipidemia, unspecified: Secondary | ICD-10-CM | POA: Insufficient documentation

## 2017-12-15 DIAGNOSIS — R079 Chest pain, unspecified: Secondary | ICD-10-CM

## 2017-12-15 DIAGNOSIS — K759 Inflammatory liver disease, unspecified: Secondary | ICD-10-CM | POA: Diagnosis not present

## 2017-12-15 DIAGNOSIS — Z79899 Other long term (current) drug therapy: Secondary | ICD-10-CM | POA: Diagnosis not present

## 2017-12-15 DIAGNOSIS — K7689 Other specified diseases of liver: Secondary | ICD-10-CM | POA: Diagnosis not present

## 2017-12-15 DIAGNOSIS — K449 Diaphragmatic hernia without obstruction or gangrene: Secondary | ICD-10-CM | POA: Insufficient documentation

## 2017-12-15 DIAGNOSIS — R1013 Epigastric pain: Principal | ICD-10-CM | POA: Insufficient documentation

## 2017-12-15 DIAGNOSIS — Z791 Long term (current) use of non-steroidal anti-inflammatories (NSAID): Secondary | ICD-10-CM | POA: Diagnosis not present

## 2017-12-15 DIAGNOSIS — R0602 Shortness of breath: Secondary | ICD-10-CM | POA: Diagnosis not present

## 2017-12-15 DIAGNOSIS — I10 Essential (primary) hypertension: Secondary | ICD-10-CM | POA: Diagnosis present

## 2017-12-15 DIAGNOSIS — R109 Unspecified abdominal pain: Secondary | ICD-10-CM | POA: Insufficient documentation

## 2017-12-15 DIAGNOSIS — K2970 Gastritis, unspecified, without bleeding: Secondary | ICD-10-CM | POA: Diagnosis present

## 2017-12-15 LAB — HEPATIC FUNCTION PANEL
ALT: 346 U/L — ABNORMAL HIGH (ref 0–44)
AST: 121 U/L — ABNORMAL HIGH (ref 15–41)
Albumin: 3.3 g/dL — ABNORMAL LOW (ref 3.5–5.0)
Alkaline Phosphatase: 125 U/L (ref 38–126)
Bilirubin, Direct: 0.2 mg/dL (ref 0.0–0.2)
Indirect Bilirubin: 0.7 mg/dL (ref 0.3–0.9)
TOTAL PROTEIN: 6.5 g/dL (ref 6.5–8.1)
Total Bilirubin: 0.9 mg/dL (ref 0.3–1.2)

## 2017-12-15 LAB — CBC
HEMATOCRIT: 47.1 % — AB (ref 36.0–46.0)
Hemoglobin: 14.7 g/dL (ref 12.0–15.0)
MCH: 27.2 pg (ref 26.0–34.0)
MCHC: 31.2 g/dL (ref 30.0–36.0)
MCV: 87.2 fL (ref 80.0–100.0)
Platelets: 263 10*3/uL (ref 150–400)
RBC: 5.4 MIL/uL — ABNORMAL HIGH (ref 3.87–5.11)
RDW: 14.8 % (ref 11.5–15.5)
WBC: 19.1 10*3/uL — ABNORMAL HIGH (ref 4.0–10.5)
nRBC: 0 % (ref 0.0–0.2)

## 2017-12-15 LAB — BASIC METABOLIC PANEL
Anion gap: 10 (ref 5–15)
BUN: 14 mg/dL (ref 8–23)
CO2: 27 mmol/L (ref 22–32)
Calcium: 8.8 mg/dL — ABNORMAL LOW (ref 8.9–10.3)
Chloride: 101 mmol/L (ref 98–111)
Creatinine, Ser: 1.35 mg/dL — ABNORMAL HIGH (ref 0.44–1.00)
GFR calc Af Amer: 49 mL/min — ABNORMAL LOW (ref >60)
GFR calc non Af Amer: 42 mL/min — ABNORMAL LOW (ref >60)
Glucose, Bld: 91 mg/dL (ref 70–99)
Potassium: 4.8 mmol/L (ref 3.5–5.1)
Sodium: 138 mmol/L (ref 135–145)

## 2017-12-15 LAB — LIPASE, BLOOD: Lipase: 26 U/L (ref 11–51)

## 2017-12-15 LAB — I-STAT TROPONIN, ED: Troponin i, poc: 0.01 ng/mL (ref 0.00–0.08)

## 2017-12-15 MED ORDER — DIPHENHYDRAMINE HCL 25 MG PO CAPS: 25.0000 mg | ORAL_CAPSULE | Freq: Four times a day (QID) | ORAL | Status: DC | PRN

## 2017-12-15 MED ORDER — CLONIDINE HCL 0.3 MG/24HR TD PTWK: 0.3000 mg | MEDICATED_PATCH | TRANSDERMAL | Status: DC

## 2017-12-15 MED ORDER — CALCIUM-MAGNESIUM-VITAMIN D ER 600-40-500 MG-MG-UNIT PO TB24: 3.0000 | ORAL_TABLET | Freq: Every day | ORAL | Status: DC

## 2017-12-15 MED ORDER — IBUPROFEN 800 MG PO TABS: 800.0000 mg | ORAL_TABLET | Freq: Two times a day (BID) | ORAL | Status: DC | PRN

## 2017-12-15 MED ORDER — NITROGLYCERIN 0.4 MG SL SUBL: 0.4000 mg | SUBLINGUAL_TABLET | SUBLINGUAL | Status: DC | PRN

## 2017-12-15 MED ORDER — ONDANSETRON HCL 4 MG/2ML IJ SOLN: 4.0000 mg | Freq: Four times a day (QID) | INTRAMUSCULAR | Status: DC | PRN

## 2017-12-15 MED ORDER — SODIUM CHLORIDE 0.9 % IV SOLN
INTRAVENOUS | Status: DC
  Administered 2017-12-15 – 2017-12-16 (×2): via INTRAVENOUS

## 2017-12-15 MED ORDER — DIPHENHYDRAMINE-ZINC ACETATE 2-0.1 % EX CREA
1.0000 "application " | TOPICAL_CREAM | Freq: Three times a day (TID) | CUTANEOUS | Status: DC | PRN
  Filled 2017-12-15: qty 28

## 2017-12-15 MED ORDER — PANTOPRAZOLE SODIUM 40 MG PO TBEC
80.0000 mg | DELAYED_RELEASE_TABLET | Freq: Every day | ORAL | Status: DC
  Administered 2017-12-16: 80 mg via ORAL
  Filled 2017-12-15: qty 2

## 2017-12-15 MED ORDER — BISACODYL 10 MG RE SUPP: 10.0000 mg | Freq: Every day | RECTAL | Status: DC | PRN

## 2017-12-15 MED ORDER — SPIRONOLACTONE 100 MG PO TABS
100.0000 mg | ORAL_TABLET | Freq: Every day | ORAL | Status: DC
  Administered 2017-12-16: 100 mg via ORAL
  Filled 2017-12-15: qty 1

## 2017-12-15 MED ORDER — ENOXAPARIN SODIUM 30 MG/0.3ML ~~LOC~~ SOLN
30.0000 mg | SUBCUTANEOUS | Status: DC
  Administered 2017-12-15: 30 mg via SUBCUTANEOUS
  Filled 2017-12-15: qty 0.3

## 2017-12-15 MED ORDER — METOPROLOL TARTRATE 50 MG PO TABS
50.0000 mg | ORAL_TABLET | Freq: Two times a day (BID) | ORAL | Status: DC
  Administered 2017-12-15 – 2017-12-16 (×2): 50 mg via ORAL
  Filled 2017-12-15 (×2): qty 1

## 2017-12-15 MED ORDER — DICLOFENAC SODIUM 1 % TD GEL
2.0000 g | TRANSDERMAL | Status: DC | PRN
  Filled 2017-12-15: qty 100

## 2017-12-15 MED ORDER — FUROSEMIDE 20 MG PO TABS
20.0000 mg | ORAL_TABLET | Freq: Every day | ORAL | Status: DC
  Administered 2017-12-16: 20 mg via ORAL
  Filled 2017-12-15: qty 1

## 2017-12-15 MED ORDER — DOCUSATE SODIUM 100 MG PO CAPS: 100.0000 mg | ORAL_CAPSULE | Freq: Two times a day (BID) | ORAL | Status: DC | PRN

## 2017-12-15 MED ORDER — ONDANSETRON HCL 4 MG PO TABS: 4.0000 mg | ORAL_TABLET | Freq: Four times a day (QID) | ORAL | Status: DC | PRN

## 2017-12-15 MED ORDER — ALPRAZOLAM 0.25 MG PO TABS
0.2500 mg | ORAL_TABLET | Freq: Once | ORAL | Status: AC
  Administered 2017-12-15: 0.25 mg via ORAL
  Filled 2017-12-15: qty 1

## 2017-12-15 MED ORDER — ENOXAPARIN SODIUM 60 MG/0.6ML ~~LOC~~ SOLN: 55.0000 mg | SUBCUTANEOUS | Status: DC

## 2017-12-15 MED ORDER — ALBUTEROL SULFATE (2.5 MG/3ML) 0.083% IN NEBU: 2.5000 mg | INHALATION_SOLUTION | Freq: Four times a day (QID) | RESPIRATORY_TRACT | Status: DC | PRN

## 2017-12-15 MED ORDER — FAMOTIDINE 20 MG PO TABS
20.0000 mg | ORAL_TABLET | Freq: Two times a day (BID) | ORAL | Status: DC
  Administered 2017-12-15 – 2017-12-16 (×2): 20 mg via ORAL
  Filled 2017-12-15 (×2): qty 1

## 2017-12-15 NOTE — ED Notes (Signed)
ED Provider at bedside. 

## 2017-12-15 NOTE — ED Triage Notes (Signed)
Pt in c/o chest pain and nausea that started this morning, was recently admitted with intubation for angioedema, reports mild cough and shortness of breath, no distress noted

## 2017-12-15 NOTE — ED Notes (Signed)
Patient transported to Ultrasound 

## 2017-12-15 NOTE — H&P (Addendum)
History and Physical    Betsabe Iglesia Lasala WIO:[id number] DOB: [birth year] DOA: 12/15/2017  PCP: Josetta Huddle, MD Consultants:  none Patient coming from: home- lives with husband  Chief Complaint: Abdominal pain  HPI: GENEAL HUEBERT is a 62 y.o. female with medical history significant for HTN, GERD,  who presented to the ED today with c/o . She was discharged on 12/8 from an ICU stay for angioedema (thought 2/2 losartan, chlorthalidone) and had been doing well up until yesterday when she was sitting down wrapping presents and had the acute onset of low back pain / R sided back pain (6 out of 10) along with severe nausea and lightheadedness. She went back to the couch for the rest of the day and went to bed still feeling poorly. She was awoken by severe epigastric pain that radiated into her central chest and neck. Back pain had resolved but she was still nauseated. She took Pepto Bismol x 2 without relief. She came to the ED at the prompting of her doctor. She had a temperature of 99.3 last night but has since been afebrile. She has had had any diarrhea and no actual vomiting. She has had no bloody or black stools. No recent travel, no sick contacts, no suspicious food. She is s/p lap chole. She takes tylenol on a rare occasion. No tobacco, alcohol or drugs. No blood thinners. Abdominal pain has been coming and going today, currently rated a 4/10; she has not received any pain meds. She thinks her abdomen has become distended in the last 2-3 days. She reports her temp was 99.3 last night but has not taken her temp otherwise. No f/c/s.  She was recently started on lasix and spironolactone (one week ago). She was started on lipitor 4-5 days ago as well. Her lopressor was increased recently when she did not tolerate the norvasc that was prescribed. She completed a course of prednisone about 2 days ago (for angioedema)  ED Course: She was afebrile with stable VS. WBC was 19K (recent steroids). AST and  ALT were noted to be elevated at 121/346 (from 34/35 last hospitalization). CT noncontrast showed mild ascites, was o/w unremarkable. Dr. Johnney Killian (ED) spoke with Dr. Nelda Marseille (PCCM) who mentioned possibility of Budd Chiari. Dr. Watt Climes (GI) was then called who mentioned possibility of CBD stone (pt is s/p chole);  recommended RUQ u/s and possible MRCP. U/S currently pending. [facility] called for admission.  Review of Systems: As per HPI; otherwise review of systems reviewed and negative.   Ambulatory Status:  Ambulates without assistance  Past Medical History:  Diagnosis Date  . Abnormal Pap smear of cervix   . Anemia    as a child  . Anxiety   . Bulging lumbar disc   . GERD (gastroesophageal reflux disease)   . Hypertension   . Lactose intolerance   . MVP (mitral valve prolapse)   . Pyelonephritis   . Renal disorder   . Sleep apnea   . Spinal stenosis     Past Surgical History:  Procedure Laterality Date  . ankle fracture  10/09   bilateral-right was surgically repaired  . ANKLE SURGERY  10/10   screws removed  . ANKLE SURGERY  2/12   removal of all hardware right ankle  . APPENDECTOMY    . arm surgery     plate in left arm  . BREAST LUMPECTOMY     right breast  . CHOLECYSTECTOMY    . DILATION AND CURETTAGE OF UTERUS  Social History   Socioeconomic History  . Marital status: Married    Spouse name: Not on file  . Number of children: 2  . Years of education: Not on file  . Highest education level: Not on file  Occupational History    Employer: [employer]  . Financial resource strain: Not on file  . Food insecurity:    Worry: Not on file    Inability: Not on file  . Transportation needs:    Medical: Not on file    Non-medical: Not on file  Tobacco Use  . Smoking status: Never Smoker  . Smokeless tobacco: Never Used  Substance and Sexual Activity  . Alcohol use: No    Alcohol/week: 0.0 standard drinks  . Drug use: No  . Sexual activity: Yes     Partners: Male    Birth control/protection: Post-menopausal    Comment: ablation  Lifestyle  . Physical activity:    Days per week: Not on file    Minutes per session: Not on file  . Stress: Not on file  Relationships  . Social connections:    Talks on phone: Not on file    Gets together: Not on file    Attends religious service: Not on file    Active member of club or organization: Not on file    Attends meetings of clubs or organizations: Not on file    Relationship status: Not on file  . Intimate partner violence:    Fear of current or ex partner: Not on file    Emotionally abused: Not on file    Physically abused: Not on file    Forced sexual activity: Not on file  Other Topics Concern  . Not on file  Social History Narrative  . Not on file    Allergies  Allergen Reactions  . Ace Inhibitors     Angioedema   . Chlorthalidone     Angioedema   . Crab [Shellfish Allergy] Anaphylaxis  . Edarbi [Azilsartan] Hypertension  . Ivp Dye [Iodinated Diagnostic Agents] Anaphylaxis  . Losartan     Angioedema   . Nizatidine Anaphylaxis    Tolerates famotidine and cimetidine  . Dilaudid [Hydromorphone Hcl] Nausea And Vomiting    Pt can tolerate morphine  . Hydrocodone-Acetaminophen Nausea And Vomiting  . Oxycodone-Acetaminophen Nausea And Vomiting    Family History  Problem Relation Age of Onset  . Diabetes Maternal Grandmother   . Stroke Maternal Grandmother   . Kidney disease Maternal Grandmother   . Ovarian cancer Paternal Grandmother        dx. late 30s-early 24s  . Prostate cancer Paternal Grandfather   . Ovarian cancer Paternal Aunt        (x3) paternal aunts dx. ages 40s-late 70s  . Breast cancer Maternal Aunt        dx. 66s  . Hypertension Mother   . Heart disease Mother   . Goiter Mother   . Other Mother        breast lumpectomy in early 72s, was in the hospital for 5 days after  . Heart disease Father   . Prostate cancer Father 77       s/p orchiectomy  .  Stroke Father   . Ulcers Brother   . Other Brother        elevated PSA level w/ surveillance  . Colon polyps Brother        less than 10  . Pyelonephritis Daughter   .  Breast cancer Sister 53       left breast ca - poorly differentiated carcinoma with squamous metaplasia; triple neg; has since had BL mastectomies  . Colon polyps Sister        less than 10  . Congestive Heart Failure Sister   . Prostate cancer Paternal Uncle        (x5) paternal uncles dx. prostate cancer in their 74s  . Prostate cancer Maternal Grandfather        d. late 34s with mets to colon  . Other Other 39       NOS benign brain tumor; +fluid  . Prostate cancer Cousin 46       paternal 1st cousin   . Prostate cancer Cousin        paternal 1st cousin, once-removed dx. 10s  . Prostate cancer Cousin        (x2) paternal 1st cousins, once-removed, dx. early 14s  . Lung cancer Brother 72       paternal half-brother; smoker  . Uterine cancer Maternal Aunt        dx. 30s  . Breast cancer Maternal Aunt        dx. bilateral breast cancers - 60s, 53s  . Liver cancer Maternal Aunt        +hepatitis C; d. 71s  . Congestive Heart Failure Maternal Uncle 68  . Stroke Maternal Uncle        d. 76s; (x2 maternal uncles)  . Cancer Cousin 52       sinus cancer dx. 62s (maternal 1st cousin)  . Lung cancer Cousin        maternal 1st cousin dx. 60s/smoker; maternal 1st cousin  . Pancreatic cancer Cousin        maternal 1st cousin dx. late 60s  . Prostate cancer Cousin        maternal 1st cousin dx. 89  . Breast cancer Cousin        (x4) maternal 1st cousins dx. late 5s, 13s    Prior to Admission medications   Medication Sig Start Date End Date Taking? Authorizing Provider  albuterol (PROVENTIL HFA;VENTOLIN HFA) 108 (90 Base) MCG/ACT inhaler Inhale 1-2 puffs into the lungs every 6 (six) hours as needed for wheezing or shortness of breath. 10/01/17  Yes Bast, Traci A, NP  atorvastatin (LIPITOR) 40 MG tablet Take 1  tablet (40 mg total) by mouth daily at 6 PM. 12/12/17  Yes Adhikari, Tamsen Meek, MD  Calcium-Magnesium-Vitamin D (CALCIUM 1200+D3) 600-40-500 MG-MG-UNIT TB24 Take 3 tablets by mouth daily.   Yes [provider]  cloNIDine (CATAPRES - DOSED IN MG/24 HR) 0.3 mg/24hr patch Place 1 patch (0.3 mg total) onto the skin once a week. 12/15/17  Yes Shelly Coss, MD  diclofenac sodium (VOLTAREN) 1 % GEL Apply 2 g topically as needed. To affected joint 10/13/17  Yes [provider]  diphenhydrAMINE (BENADRYL) 25 mg capsule Take 25 mg by mouth every 6 (six) hours as needed.   Yes [provider]  diphenhydrAMINE-zinc acetate (BENADRYL EXTRA STRENGTH) cream Apply 1 application topically 3 (three) times daily as needed for itching (Apply on hands). 12/12/17  Yes Shelly Coss, MD  esomeprazole (NEXIUM) 40 MG capsule Take 40 mg by mouth daily.    Yes [provider]  famotidine (PEPCID) 20 MG tablet Take 1 tablet (20 mg total) by mouth 2 (two) times daily. 12/12/17  Yes Shelly Coss, MD  furosemide (LASIX) 20 MG tablet Take 1 tablet (20 mg  total) by mouth daily. 12/13/17  Yes Shelly Coss, MD  ibuprofen (ADVIL,MOTRIN) 800 MG tablet Take 800 mg by mouth every 12 (twelve) hours as needed for moderate pain. Every 12 hours prn    Yes [provider]  Liniments (SALONPAS EX) Apply 1 each topically daily as needed (pain).   Yes [provider]  metoprolol tartrate (LOPRESSOR) 50 MG tablet Take 1 tablet (50 mg total) by mouth 2 (two) times daily. 12/12/17  Yes Shelly Coss, MD  nitroGLYCERIN (NITROSTAT) 0.4 MG SL tablet Place 1 tablet (0.4 mg total) under the tongue every 5 (five) minutes as needed for chest pain. 10/17/13  Yes Reyne Dumas, MD  spironolactone (ALDACTONE) 100 MG tablet Take 1 tablet (100 mg total) by mouth daily. 12/13/17  Yes Shelly Coss, MD    Physical Exam: Vitals:   12/15/17 1500 12/15/17 1515 12/15/17 1530 12/15/17 1545  BP: 119/71 118/80  123/81 129/84  Pulse: 70 70 74 73  Resp: '19 17 14 18  ' Temp:      TempSrc:      SpO2: 94% 98% 96% 97%  Weight:      Height:         . General: Appears tired but otherwise comfortable and is in NAD . Eyes:  PERRL, EOMI, normal lids, iris . ENT:  grossly normal hearing, lips & tongue, mmm . Neck:  supple, no lymphadenopathy . Cardiovascular:  nL S1, S2, normal rate, reg rhythm, no murmur. Marland Kitchen Respiratory:   CTA bilaterally with no wheezes/rales/rhonchi.  Normal respiratory effort. . Abdomen: moderately distended; NABS, tender to palpation in epigastric and RUQ regions. No R/G. No masses/organomegaly palpated. . Back:   grossly normal alignment . Skin:  no rash or lesions seen on limited exam . Musculoskeletal:  grossly normal tone BUE/BLE, good ROM, no bony abnormality or obvious joint deformity . Lower extremities:  No LE edema.  Limited foot exam with no ulcerations.  2+ distal pulses. Marland Kitchen Psychiatric:  grossly normal mood and affect, speech fluent and appropriate, AOx3 . Neurologic:  CN 2-12 grossly intact, moves all extremities in coordinated fashion, sensation intact, Patellar DTRs 2+ and symmetric    Radiological Exams on Admission: Ct Abdomen Wo Contrast  Result Date: 12/15/2017 CLINICAL DATA:  Shortness of breath. Nausea and vomiting. EXAM: CT CHEST, ABDOMEN AND PELVIS WITHOUT CONTRAST TECHNIQUE: Multidetector CT imaging of the chest, abdomen and pelvis was performed following the standard protocol without IV contrast. COMPARISON:  Chest x-ray dated 12/15/2017 and CT scan of the abdomen dated 03/24/2017 FINDINGS: CT CHEST FINDINGS Cardiovascular: No significant vascular findings. Normal heart size. No pericardial effusion. Mediastinum/Nodes: No enlarged mediastinal, hilar, or axillary lymph nodes. Thyroid gland, trachea, and esophagus demonstrate no significant findings. Tiny hiatal hernia. Lungs/Pleura: Lungs are clear. No pleural effusion or pneumothorax. Musculoskeletal: No chest  wall mass or suspicious bone lesions identified. CT ABDOMEN PELVIS FINDINGS Hepatobiliary: No focal liver abnormality is seen. Status post cholecystectomy. No biliary dilatation. Pancreas: Unremarkable. No pancreatic ductal dilatation or surrounding inflammatory changes. Spleen: Normal in size without focal abnormality. Adrenals/Urinary Tract: Adrenal glands are normal. Multiple chronic prominent bilateral renal cysts. No hydronephrosis. Ureters and bladder are normal. Stomach/Bowel: Stomach is within normal limits except for a tiny hiatal hernia. Appendix has been removed. No evidence of bowel wall thickening, distention, or inflammatory changes. Vascular/Lymphatic: No significant vascular findings are present. No enlarged abdominal or pelvic lymph nodes. Reproductive: Uterus and bilateral adnexa are unremarkable. Other: There's a small amount of ascites in the pelvis. Musculoskeletal:  No acute or significant osseous findings. IMPRESSION: 1. No acute abnormalities of the chest. 2. Small amount of new ascites in the pelvis, nonspecific. 3. Multiple chronic prominent bilateral renal cysts. Electronically Signed   By: Lorriane Shire M.D.   On: 12/15/2017 14:03   Dg Chest 2 View  Result Date: 12/15/2017 CLINICAL DATA:  Recent onset of severe mid chest pain. EXAM: CHEST - 2 VIEW COMPARISON:  Radiographs 12/05/2017 and 11/19/2017. FINDINGS: Interval extubation and removal of the enteric tube. The heart size and mediastinal contours are stable. The lungs are now clear without residual edema. There is no pleural effusion or pneumothorax. No osseous abnormalities are seen. IMPRESSION: No active cardiopulmonary process. Electronically Signed   By: Richardean Sale M.D.   On: 12/15/2017 10:48   Ct Chest Wo Contrast  Result Date: 12/15/2017 CLINICAL DATA:  Shortness of breath. Nausea and vomiting. EXAM: CT CHEST, ABDOMEN AND PELVIS WITHOUT CONTRAST TECHNIQUE: Multidetector CT imaging of the chest, abdomen and pelvis  was performed following the standard protocol without IV contrast. COMPARISON:  Chest x-ray dated 12/15/2017 and CT scan of the abdomen dated 03/24/2017 FINDINGS: CT CHEST FINDINGS Cardiovascular: No significant vascular findings. Normal heart size. No pericardial effusion. Mediastinum/Nodes: No enlarged mediastinal, hilar, or axillary lymph nodes. Thyroid gland, trachea, and esophagus demonstrate no significant findings. Tiny hiatal hernia. Lungs/Pleura: Lungs are clear. No pleural effusion or pneumothorax. Musculoskeletal: No chest wall mass or suspicious bone lesions identified. CT ABDOMEN PELVIS FINDINGS Hepatobiliary: No focal liver abnormality is seen. Status post cholecystectomy. No biliary dilatation. Pancreas: Unremarkable. No pancreatic ductal dilatation or surrounding inflammatory changes. Spleen: Normal in size without focal abnormality. Adrenals/Urinary Tract: Adrenal glands are normal. Multiple chronic prominent bilateral renal cysts. No hydronephrosis. Ureters and bladder are normal. Stomach/Bowel: Stomach is within normal limits except for a tiny hiatal hernia. Appendix has been removed. No evidence of bowel wall thickening, distention, or inflammatory changes. Vascular/Lymphatic: No significant vascular findings are present. No enlarged abdominal or pelvic lymph nodes. Reproductive: Uterus and bilateral adnexa are unremarkable. Other: There's a small amount of ascites in the pelvis. Musculoskeletal: No acute or significant osseous findings. IMPRESSION: 1. No acute abnormalities of the chest. 2. Small amount of new ascites in the pelvis, nonspecific. 3. Multiple chronic prominent bilateral renal cysts. Electronically Signed   By: Lorriane Shire M.D.   On: 12/15/2017 14:03    EKG: not done   Labs on Admission: I have personally reviewed the available labs and imaging studies at the time of the admission.  Pertinent labs:  CO2 27 Creat 1.35 (baseline) Ca 8.8, albumin 3.3 Alk phos  125 AST/ALT 121/346 Total bili 0.7  WBC 19.1 Hgb 14.7 Plt 263  TnI 0.01   Assessment/Plan Principal Problem:   Epigastric pain Active Problems:   Essential hypertension   Hyperlipemia   GERD (gastroesophageal reflux disease)   CKD (chronic kidney disease), stage III (HCC)   Multiple renal cysts   Epigastric pain: this seems to be a component of a constellation of symptoms including nausea, back pain. Lipase is WNL and CT shows no evidence of pancreatitis. DDx includes Budd Chiari syndrome, CBD stone. Would be atypical presentation for ACS but needs to be in the differential. Although pain is in the epigastric/RUQ, she did have low back pain and nausea, so UTI could be present. In any case, it is possible that there is not one unifying diagnosis but rather 2 or 3 things going on. She had a recent EGD/colonoscopy that was reportedly normal. Given  RUQ pain, elevated ALT>AST and reported low grade fever, nausea, also consider viral hepatitis. Increased ALT in relation to AST also raises possibility of DILI. -admit to inpatient -f/u liver doppler -appreciate GI recs -check EKG -cycle troponins -check U/A -monitor WBC; she is off steroids now so should be downtrending -check acute hepatitis panel  HTN, uncontrolled. Being followed by renal as outpatient. Recent normal catecholamine levels. Has been intolerant/allergic to several BP meds including ACEIs, Edarbi, losartan, chlorthalidone, isosorbide-hydralazine. -continue catapres, lasix, spironolactone, metoprolol -cont outpatient f/u with renal  GERD -cont home PPI, H2B  CKD3 -creatinine at baseline -renally dose meds, avoid nephrotoxins  HLD -have held statin as pt believes this could be cause of her symptoms. If no other cause for her symptoms is found, would continue to hold statin.  DVT prophylaxis: lovenox Code Status:  Full - confirmed with patient/family Family Communication: none  Disposition Plan:  Home once  clinically improved Consults called: PCCM, GI  Admission status: Admit - It is my clinical opinion that admission to INPATIENT is reasonable and necessary because of the expectation that this patient will require hospital care that crosses at least 2 midnights to treat this condition based on the medical complexity of the problems presented.  Given the aforementioned information, the predictability of an adverse outcome is felt to be significant.     Janora Norlander MD [facility]  If note is complete, please contact covering daytime or nighttime physician. [email or web address] Password [facility]  12/15/2017, 4:56 PM

## 2017-12-15 NOTE — ED Provider Notes (Signed)
[facility] EMERGENCY DEPARTMENT Provider Note   CSN: [id number] Arrival date & time: 12/15/17  1023     History   Chief Complaint Chief Complaint  Patient presents with  . Chest Pain    HPI [name] is a 62 y.o. female.  HPI Patient is status post hospitalization with discharge on 12\9.  She had angioedema and required intubation and ICU management.  Patient was discharged 2 days ago.  She reports that she felt fine at time of discharge.  She was not having epigastric or back pain.  She reports that yesterday she got a very sudden intense feeling of nausea and pain in her mid central lower back.  She reports that she just went and rested and she got some improvement and thought that things were improving.  At that time she did not have additional symptoms.  Today she reports she got a pain in her epigastrium that is severe and radiates up to the chest.  She reports that she did not vomit but got intensely nauseated.  The pain has persisted.  She does not feel short of breath with it.  She reports she has been on both Nexium and Pepcid twice daily and being compliant with medication.  She no longer has a gallbladder.  She has not had similar pain. Past Medical History:  Diagnosis Date  . Abnormal Pap smear of cervix   . Anemia    as a child  . Anxiety   . Bulging lumbar disc   . GERD (gastroesophageal reflux disease)   . Hypertension   . Lactose intolerance   . MVP (mitral valve prolapse)   . Pyelonephritis   . Renal disorder   . Sleep apnea   . Spinal stenosis     Patient Active Problem List   Diagnosis Date Noted  . Angioedema 12/05/2017  . Acute respiratory failure with hypoxemia ([place])   . Essential hypertension   . Family history of breast cancer in female 04/12/2015  . Family history of ovarian cancer 04/12/2015  . Cervical radiculopathy 04/06/2014  . Carpal tunnel syndrome of left wrist 04/06/2014  . Left arm pain 10/27/2013  .  Cervical disc disorder with radiculopathy of cervical region 10/27/2013  . Ischemic chest pain ([place]) 10/17/2013  . Chest pain 10/17/2013  . Airway compromise 06/28/2013  . Dizziness 06/02/2013  . Headache(784.0) 06/02/2013  . Drowsiness 06/02/2013  . Neck pain 06/02/2013  . ABNORMALITY OF GAIT 03/15/2009  . ANKLE PAIN, BILATERAL 03/14/2009  . Pain in limb 03/14/2009    Past Surgical History:  Procedure Laterality Date  . ankle fracture  10/09   bilateral-right was surgically repaired  . ANKLE SURGERY  10/10   screws removed  . ANKLE SURGERY  2/12   removal of all hardware right ankle  . APPENDECTOMY    . arm surgery     plate in left arm  . BREAST LUMPECTOMY     right breast  . CHOLECYSTECTOMY    . DILATION AND CURETTAGE OF UTERUS       OB History    Gravida  4   Para  2   Term      Preterm      AB  2   Living  2     SAB  2   TAB      Ectopic      Multiple      Live Births  Obstetric Comments  1 stepchild         Home Medications    Prior to Admission medications   Medication Sig Start Date End Date Taking? Authorizing Provider  albuterol (PROVENTIL HFA;VENTOLIN HFA) 108 (90 Base) MCG/ACT inhaler Inhale 1-2 puffs into the lungs every 6 (six) hours as needed for wheezing or shortness of breath. 10/01/17   Loura Halt A, NP  atorvastatin (LIPITOR) 40 MG tablet Take 1 tablet (40 mg total) by mouth daily at 6 PM. 12/12/17   Shelly Coss, MD  Calcium-Magnesium-Vitamin D (CALCIUM 1200+D3) 600-40-500 MG-MG-UNIT TB24 Take 3 tablets by mouth daily.    [provider]  cloNIDine (CATAPRES - DOSED IN MG/24 HR) 0.3 mg/24hr patch Place 1 patch (0.3 mg total) onto the skin once a week. 12/15/17   Shelly Coss, MD  diclofenac sodium (VOLTAREN) 1 % GEL Apply 2 g topically 3 (three) times daily. To affected joint 10/13/17   [provider]  diphenhydrAMINE (BENADRYL) 25 mg capsule Take 25 mg by mouth every 6 (six) hours as needed.     [provider]  diphenhydrAMINE-zinc acetate (BENADRYL EXTRA STRENGTH) cream Apply 1 application topically 3 (three) times daily as needed for itching (Apply on hands). 12/12/17   Shelly Coss, MD  esomeprazole (NEXIUM) 40 MG capsule Take 40 mg by mouth daily.     [provider]  famotidine (PEPCID) 20 MG tablet Take 1 tablet (20 mg total) by mouth 2 (two) times daily. 12/12/17   Shelly Coss, MD  furosemide (LASIX) 20 MG tablet Take 1 tablet (20 mg total) by mouth daily. 12/13/17   Shelly Coss, MD  ibuprofen (ADVIL,MOTRIN) 800 MG tablet Take 800 mg by mouth every 12 (twelve) hours as needed for moderate pain. Every 12 hours prn     [provider]  Liniments (SALONPAS EX) Apply 1 each topically daily as needed (pain).    [provider]  metoprolol tartrate (LOPRESSOR) 50 MG tablet Take 1 tablet (50 mg total) by mouth 2 (two) times daily. 12/12/17   Shelly Coss, MD  nitroGLYCERIN (NITROSTAT) 0.4 MG SL tablet Place 1 tablet (0.4 mg total) under the tongue every 5 (five) minutes as needed for chest pain. Patient not taking: Reported on 12/14/2017 10/17/13   Reyne Dumas, MD  spironolactone (ALDACTONE) 100 MG tablet Take 1 tablet (100 mg total) by mouth daily. 12/13/17   Shelly Coss, MD    Family History Family History  Problem Relation Age of Onset  . Diabetes Maternal Grandmother   . Stroke Maternal Grandmother   . Kidney disease Maternal Grandmother   . Ovarian cancer Paternal Grandmother        dx. late 30s-early 1s  . Prostate cancer Paternal Grandfather   . Ovarian cancer Paternal Aunt        (x3) paternal aunts dx. ages 4s-late 70s  . Breast cancer Maternal Aunt        dx. 61s  . Hypertension Mother   . Heart disease Mother   . Goiter Mother   . Other Mother        breast lumpectomy in early 31s, was in the hospital for 5 days after  . Heart disease Father   . Prostate cancer Father 57       s/p orchiectomy  . Stroke Father    . Ulcers Brother   . Other Brother        elevated PSA level w/ surveillance  . Colon polyps Brother  less than 10  . Pyelonephritis Daughter   . Breast cancer Sister 68       left breast ca - poorly differentiated carcinoma with squamous metaplasia; triple neg; has since had BL mastectomies  . Colon polyps Sister        less than 10  . Congestive Heart Failure Sister   . Prostate cancer Paternal Uncle        (x5) paternal uncles dx. prostate cancer in their 48s  . Prostate cancer Maternal Grandfather        d. late 11s with mets to colon  . Other Other 39       NOS benign brain tumor; +fluid  . Prostate cancer Cousin 41       paternal 1st cousin   . Prostate cancer Cousin        paternal 1st cousin, once-removed dx. 88s  . Prostate cancer Cousin        (x2) paternal 1st cousins, once-removed, dx. early 40s  . Lung cancer Brother 76       paternal half-brother; smoker  . Uterine cancer Maternal Aunt        dx. 30s  . Breast cancer Maternal Aunt        dx. bilateral breast cancers - 16s, 43s  . Liver cancer Maternal Aunt        +hepatitis C; d. 20s  . Congestive Heart Failure Maternal Uncle 68  . Stroke Maternal Uncle        d. 43s; (x2 maternal uncles)  . Cancer Cousin 52       sinus cancer dx. 81s (maternal 1st cousin)  . Lung cancer Cousin        maternal 1st cousin dx. 60s/smoker; maternal 1st cousin  . Pancreatic cancer Cousin        maternal 1st cousin dx. late 44s  . Prostate cancer Cousin        maternal 1st cousin dx. 32  . Breast cancer Cousin        (x4) maternal 1st cousins dx. late 56s, 26s    Social History Social History   Tobacco Use  . Smoking status: Never Smoker  . Smokeless tobacco: Never Used  Substance Use Topics  . Alcohol use: No    Alcohol/week: 0.0 standard drinks  . Drug use: No     Allergies   Ace inhibitors; Chlorthalidone; Crab [shellfish allergy]; Edarbi [azilsartan]; Ivp dye [iodinated diagnostic agents]; Losartan;  Nizatidine; Dilaudid [hydromorphone hcl]; Hydrocodone-acetaminophen; and Oxycodone-acetaminophen   Review of Systems Review of Systems 10 Systems reviewed and are negative for acute change except as noted in the HPI.   Physical Exam Updated Vital Signs BP 134/72   Pulse 60   Temp 98 F (36.7 C) (Oral)   Resp 11   Ht 5\' 9"  (1.753 m)   Wt 107.5 kg   LMP 07/05/2009   SpO2 98%   BMI 35.00 kg/m   Physical Exam  Constitutional: She is oriented to person, place, and time. She appears well-developed and well-nourished.  HENT:  Head: Normocephalic and atraumatic.  Eyes: Pupils are equal, round, and reactive to light. EOM are normal.  Neck: Neck supple.  Cardiovascular: Normal rate, regular rhythm, normal heart sounds and intact distal pulses.  Pulmonary/Chest: Effort normal and breath sounds normal.  Abdominal: Soft. Bowel sounds are normal. She exhibits distension. There is tenderness.  Moderate to severe epigastric pain to palpation. No guarding  Musculoskeletal: Normal range of motion. She exhibits no edema.  Neurological: She is alert and oriented to person, place, and time. She has normal strength. No sensory deficit. She exhibits normal muscle tone. Coordination normal. GCS eye subscore is 4. GCS verbal subscore is 5. GCS motor subscore is 6.  Skin: Skin is warm, dry and intact.  Psychiatric: She has a normal mood and affect.     ED Treatments / Results  Labs (all labs ordered are listed, but only abnormal results are displayed) Labs Reviewed  BASIC METABOLIC PANEL - Abnormal; Notable for the following components:      Result Value   Creatinine, Ser 1.35 (*)    Calcium 8.8 (*)    GFR calc non Af Amer 42 (*)    GFR calc Af Amer 49 (*)    All other components within normal limits  CBC - Abnormal; Notable for the following components:   WBC 19.1 (*)    RBC 5.40 (*)    HCT 47.1 (*)    All other components within normal limits  LIPASE, BLOOD  HEPATIC FUNCTION PANEL    I-STAT TROPONIN, ED    EKG None  Radiology Dg Chest 2 View  Result Date: 12/15/2017 CLINICAL DATA:  Recent onset of severe mid chest pain. EXAM: CHEST - 2 VIEW COMPARISON:  Radiographs 12/05/2017 and 11/19/2017. FINDINGS: Interval extubation and removal of the enteric tube. The heart size and mediastinal contours are stable. The lungs are now clear without residual edema. There is no pleural effusion or pneumothorax. No osseous abnormalities are seen. IMPRESSION: No active cardiopulmonary process. Electronically Signed   By: Richardean Sale M.D.   On: 12/15/2017 10:48    Procedures Procedures (including critical care time)  Medications Ordered in ED Medications  ALPRAZolam Duanne Moron) tablet 0.25 mg (0.25 mg Oral Given 12/15/17 1200)     Initial Impression / Assessment and Plan / ED Course  I have reviewed the triage vital signs and the nursing notes.  Pertinent labs & imaging results that were available during my care of the patient were reviewed by me and considered in my medical decision making (see chart for details).  Clinical Course as of Dec 15 1656  Wed Dec 15, 2017  1443 Consult: Reviewed with Dr. Nelda Marseille.  He had seen the patient during her previous admission.  He did not think that symptoms today were directly related to her medical procedures but consideration to possible Budd-Chiari syndrome otherwise no specific recommendations but consider consultation to GI.   [MP]  1547 Consult: Reviewed with Dr. Watt Climes of gastroenterology.  Suggests proceed with Doppler venous ultrasound to evaluate portal vessels for Budd-Chiari syndrome and also consider MRCP if needed to look for common bile duct stone.  Once patient is admitted can re-consult with results.   [MP]  1618 Consult: Reviewed with Dr. Veva Holes tried hospitalist for admission   [MP]    Clinical Course User Index [MP] Charlesetta Shanks, MD    Patient has epigastric pain and ascites with LFT bump.  At this time, exact  etiology unclear.  Within the differential diagnosis is Budd-Chiari, medication reaction, retained bile duct stone.  Will admit the patient for ongoing diagnostic evaluation and treatment.  Final Clinical Impressions(s) / ED Diagnoses   Final diagnoses:  Hepatitis  Abdominal pain, epigastric  Nonspecific chest pain    ED Discharge Orders    None       Charlesetta Shanks, MD 12/26/17 [phone]

## 2017-12-15 NOTE — Progress Notes (Signed)
Pt admitted to [facility]N07 from ED.  Oriented to room and dept.

## 2017-12-15 NOTE — ED Notes (Signed)
Called lab to add on Lipase and Hepatic function panel to previously drawn BMP

## 2017-12-16 ENCOUNTER — Other Ambulatory Visit: Payer: Self-pay

## 2017-12-16 DIAGNOSIS — E785 Hyperlipidemia, unspecified: Secondary | ICD-10-CM | POA: Diagnosis not present

## 2017-12-16 DIAGNOSIS — R1013 Epigastric pain: Secondary | ICD-10-CM | POA: Diagnosis not present

## 2017-12-16 DIAGNOSIS — R740 Nonspecific elevation of levels of transaminase and lactic acid dehydrogenase [LDH]: Secondary | ICD-10-CM

## 2017-12-16 DIAGNOSIS — K219 Gastro-esophageal reflux disease without esophagitis: Secondary | ICD-10-CM | POA: Diagnosis not present

## 2017-12-16 DIAGNOSIS — I129 Hypertensive chronic kidney disease with stage 1 through stage 4 chronic kidney disease, or unspecified chronic kidney disease: Secondary | ICD-10-CM | POA: Diagnosis not present

## 2017-12-16 DIAGNOSIS — Z9049 Acquired absence of other specified parts of digestive tract: Secondary | ICD-10-CM | POA: Diagnosis not present

## 2017-12-16 DIAGNOSIS — Z79899 Other long term (current) drug therapy: Secondary | ICD-10-CM | POA: Diagnosis not present

## 2017-12-16 DIAGNOSIS — K2900 Acute gastritis without bleeding: Secondary | ICD-10-CM | POA: Diagnosis not present

## 2017-12-16 DIAGNOSIS — N281 Cyst of kidney, acquired: Secondary | ICD-10-CM | POA: Diagnosis not present

## 2017-12-16 DIAGNOSIS — K2970 Gastritis, unspecified, without bleeding: Secondary | ICD-10-CM | POA: Diagnosis present

## 2017-12-16 DIAGNOSIS — I10 Essential (primary) hypertension: Secondary | ICD-10-CM | POA: Diagnosis not present

## 2017-12-16 DIAGNOSIS — N183 Chronic kidney disease, stage 3 (moderate): Secondary | ICD-10-CM | POA: Diagnosis not present

## 2017-12-16 LAB — COMPREHENSIVE METABOLIC PANEL
ALT: 275 U/L — ABNORMAL HIGH (ref 0–44)
AST: 84 U/L — ABNORMAL HIGH (ref 15–41)
Albumin: 3 g/dL — ABNORMAL LOW (ref 3.5–5.0)
Alkaline Phosphatase: 122 U/L (ref 38–126)
Anion gap: 9 (ref 5–15)
BUN: 16 mg/dL (ref 8–23)
CO2: 26 mmol/L (ref 22–32)
Calcium: 8.8 mg/dL — ABNORMAL LOW (ref 8.9–10.3)
Chloride: 105 mmol/L (ref 98–111)
Creatinine, Ser: 1.27 mg/dL — ABNORMAL HIGH (ref 0.44–1.00)
GFR calc Af Amer: 53 mL/min — ABNORMAL LOW (ref >60)
GFR calc non Af Amer: 46 mL/min — ABNORMAL LOW (ref >60)
Glucose, Bld: 128 mg/dL — ABNORMAL HIGH (ref 70–99)
Potassium: 4.2 mmol/L (ref 3.5–5.1)
Sodium: 140 mmol/L (ref 135–145)
Total Bilirubin: 0.8 mg/dL (ref 0.3–1.2)
Total Protein: 6 g/dL — ABNORMAL LOW (ref 6.5–8.1)

## 2017-12-16 LAB — CBC
HCT: 43.2 % (ref 36.0–46.0)
Hemoglobin: 13.5 g/dL (ref 12.0–15.0)
MCH: 27.2 pg (ref 26.0–34.0)
MCHC: 31.3 g/dL (ref 30.0–36.0)
MCV: 87.1 fL (ref 80.0–100.0)
Platelets: 243 10*3/uL (ref 150–400)
RBC: 4.96 MIL/uL (ref 3.87–5.11)
RDW: 14.8 % (ref 11.5–15.5)
WBC: 18.3 10*3/uL — ABNORMAL HIGH (ref 4.0–10.5)
nRBC: 0 % (ref 0.0–0.2)

## 2017-12-16 LAB — URINALYSIS, ROUTINE W REFLEX MICROSCOPIC
Bilirubin Urine: NEGATIVE
Glucose, UA: NEGATIVE mg/dL
Ketones, ur: NEGATIVE mg/dL
Nitrite: POSITIVE — AB
Protein, ur: NEGATIVE mg/dL
Specific Gravity, Urine: 1.021 (ref 1.005–1.030)
WBC, UA: >50 WBC/hpf — ABNORMAL HIGH (ref 0–5)
pH: 6 (ref 5.0–8.0)

## 2017-12-16 LAB — TROPONIN I: Troponin I: 0.06 ng/mL (ref <0.03)

## 2017-12-16 LAB — HEPATITIS PANEL, ACUTE
HCV Ab: <0.1 s/co ratio (ref 0.0–0.9)
Hep A IgM: NEGATIVE
Hep B C IgM: NEGATIVE
Hepatitis B Surface Ag: NEGATIVE

## 2017-12-16 MED ORDER — CEFDINIR 300 MG PO CAPS: 300.0000 mg | ORAL_CAPSULE | Freq: Two times a day (BID) | ORAL | 0 refills | Status: AC

## 2017-12-16 MED ORDER — SUCRALFATE 1 G PO TABS
1.0000 g | ORAL_TABLET | Freq: Three times a day (TID) | ORAL | Status: DC
  Administered 2017-12-16: 1 g via ORAL
  Filled 2017-12-16: qty 1

## 2017-12-16 MED ORDER — ONDANSETRON HCL 4 MG PO TABS: 4.0000 mg | ORAL_TABLET | Freq: Four times a day (QID) | ORAL | 0 refills | Status: DC | PRN

## 2017-12-16 MED ORDER — SUCRALFATE 1 G PO TABS: 1.0000 g | ORAL_TABLET | Freq: Three times a day (TID) | ORAL | 0 refills | Status: DC

## 2017-12-16 MED ORDER — CEFDINIR 300 MG PO CAPS
300.0000 mg | ORAL_CAPSULE | Freq: Two times a day (BID) | ORAL | Status: DC
  Administered 2017-12-16: 300 mg via ORAL
  Filled 2017-12-16 (×2): qty 1

## 2017-12-16 MED FILL — CEFDINIR 300 MG CAPSULE: 300 | 5 days supply | Qty: 10 | Fill #0

## 2017-12-16 MED FILL — ONDANSETRON HCL 4 MG TABLET: 4 | 5 days supply | Qty: 20 | Fill #0

## 2017-12-16 MED FILL — SUCRALFATE 1 GM TABLET: 1 | 8 days supply | Qty: 30 | Fill #0

## 2017-12-16 NOTE — Progress Notes (Signed)
Pt for discharge going home, alert and oriented, discontinued peripheral IV line, given all her personal belongings, given all her health teachings, next appointment, prescriptions, due med explained and understood, no complain of pain at this time.

## 2017-12-16 NOTE — Discharge Summary (Addendum)
Physician Discharge Summary  [name] PIR:[id number] DOB: December 27, 1955 DOA: 12/15/2017  PCP: Josetta Huddle, MD  Admit date: 12/15/2017 Discharge date: 12/16/2017 Consultations: None Admitted From: home Disposition:  home  Discharge Diagnoses:  Principal Problem:   Epigastric pain likely secondary to gastritis    Transaminitis ?medication induced Active Problems:   Essential hypertension   Hyperlipemia   GERD (gastroesophageal reflux disease)   CKD (chronic kidney disease), stage III (HCC)   Multiple renal cysts   Brief/Interim Summary: This is a 62 year old female with history of hypertension gastroesophageal reflux disease who takes famotidine and Protonix 80 mg daily, who was hospitalized recently for angioedema (thought secondary to losartan/chlorthalidone) and discharged on steroid course which she completed 2 days prior to admission presented with severe epigastric pain associated with nausea and unrelieved by Pepto-Bismol x2.  Her T-max was 99.3.  She denied any vomiting or diarrhea or melena or hematochezia.  She is status post cholecystectomy.  In the ED, lab work showed mild elevation in LFTs with AST and ALT at 121 and 346 respectively.  Her bilirubin levels have been in the normal range.  Patient had recent adjustment in medications including antihypertensives changed to Lasix and spironolactone, she was also started on Lipitor 5 days back. She was admitted for further evaluation and management.  Ultrasonogram abdomen and CT abdomen were unrevealing for any signs of pancreatitis or CBD dilatation or other intra-abdominal acute pathology.  She was started on sucralfate for possible steroid-induced gastritis.  She is tolerating diet well and wishes to go home.  She does not want to pursue MRCP given severe claustrophobia.  Of note, patient did have MRA abdomen earlier this month for evaluation of renal artery stenosis which was unremarkable.  Ultrasonogram liver Doppler did  not show any occlusive vascular disease like portal vein thrombosis in this admission.  At this point given improvement of her symptoms, diet tolerance and normal bilirubin levels, do not feel there is any need to pursue MRCP.  Given improvement of symptoms, will discharge on sucralfate 4 times daily with instructions to avoid gastric irritants including NSAIDs.  She finished prednisone course.  She states she has a follow-up appointment with her primary GI next week to discuss recent EGD results.  She is advised to hold statins for now and repeat LFTs upon GI follow-up visit.  Of note, patient's blood pressure was elevated in the last admission to greater than systolic 630Z and several medications were added to her regimen.  During this hospitalization her systolic blood pressure is fluctuating between 100-1 30.  Patient states she has a blood pressure monitor at home and can record blood pressure readings.  At this point recommend, holding Lasix and continuing other medications.  She may hold off on clonidine if blood pressure still low (she was previously on 0.2 mg clonidine 3 times daily which was changed to clonidine patch 0.3 mg per 24 hours.Marland Kitchen)  PCP to review blood pressure recordings and recommend further.     Discharge Exam: Vitals:   12/16/17 1337 12/16/17 1448  BP: (!) 101/45 137/72  Pulse: 72   Resp: 18   Temp: 98.6 F (37 C)   SpO2: 97%    Vitals:   12/16/17 0054 12/16/17 0628 12/16/17 1337 12/16/17 1448  BP: 118/66 123/80 (!) 101/45 137/72  Pulse: 63 65 72   Resp: 16 18 18    Temp: 98.2 F (36.8 C) 98.2 F (36.8 C) 98.6 F (37 C)   TempSrc: Oral Oral Oral  SpO2: 97% 97% 97%   Weight:      Height:        General: Pt is alert, awake, not in acute distress Cardiovascular: RRR, S1/S2 +, no rubs, no gallops Respiratory: CTA bilaterally, no wheezing, no rhonchi Abdominal: Soft, NT, ND, bowel sounds + Extremities: no edema, no cyanosis  Discharge Instructions  Discharge  Instructions    Call MD for:  persistant nausea and vomiting   Complete by:  As directed    Call MD for:  severe uncontrolled pain   Complete by:  As directed    Diet - low sodium heart healthy   Complete by:  As directed    Increase activity slowly   Complete by:  As directed      Allergies as of 12/16/2017      Reactions   Ace Inhibitors    Angioedema    Chlorthalidone    Angioedema    Crab [shellfish Allergy] Anaphylaxis   Edarbi [azilsartan] Hypertension   Ivp Dye [iodinated Diagnostic Agents] Anaphylaxis   Losartan    Angioedema    Nizatidine Anaphylaxis   Tolerates famotidine and cimetidine   Bidil [isosorb Dinitrate-hydralazine]    Hand swelling   Dilaudid [hydromorphone Hcl] Nausea And Vomiting   Pt can tolerate morphine   Hydrocodone-acetaminophen Nausea And Vomiting   Oxycodone-acetaminophen Nausea And Vomiting      Medication List    STOP taking these medications   atorvastatin 40 MG tablet Commonly known as:  LIPITOR   furosemide 20 MG tablet Commonly known as:  LASIX   ibuprofen 800 MG tablet Commonly known as:  ADVIL,MOTRIN     TAKE these medications   albuterol 108 (90 Base) MCG/ACT inhaler Commonly known as:  PROVENTIL HFA;VENTOLIN HFA Inhale 1-2 puffs into the lungs every 6 (six) hours as needed for wheezing or shortness of breath.   CALCIUM 1200+D3 600-40-500 MG-MG-UNIT Tb24 Generic drug:  Calcium-Magnesium-Vitamin D Take 3 tablets by mouth daily.   cefdinir 300 MG capsule Commonly known as:  OMNICEF Take 1 capsule (300 mg total) by mouth every 12 (twelve) hours for 5 days.   cloNIDine 0.3 mg/24hr patch Commonly known as:  CATAPRES - Dosed in mg/24 hr Place 1 patch (0.3 mg total) onto the skin once a week.   diclofenac sodium 1 % Gel Commonly known as:  VOLTAREN Apply 2 g topically as needed. To affected joint   diphenhydrAMINE 25 mg capsule Commonly known as:  BENADRYL Take 25 mg by mouth every 6 (six) hours as needed.    diphenhydrAMINE-zinc acetate cream Commonly known as:  BENADRYL EXTRA STRENGTH Apply 1 application topically 3 (three) times daily as needed for itching (Apply on hands).   esomeprazole 40 MG capsule Commonly known as:  NEXIUM Take 40 mg by mouth daily.   famotidine 20 MG tablet Commonly known as:  PEPCID Take 1 tablet (20 mg total) by mouth 2 (two) times daily.   metoprolol tartrate 50 MG tablet Commonly known as:  LOPRESSOR Take 1 tablet (50 mg total) by mouth 2 (two) times daily.   nitroGLYCERIN 0.4 MG SL tablet Commonly known as:  NITROSTAT Place 1 tablet (0.4 mg total) under the tongue every 5 (five) minutes as needed for chest pain.   ondansetron 4 MG tablet Commonly known as:  ZOFRAN Take 1 tablet (4 mg total) by mouth every 6 (six) hours as needed for nausea.   SALONPAS EX Apply 1 each topically daily as needed (pain).   spironolactone 100 MG  tablet Commonly known as:  ALDACTONE Take 1 tablet (100 mg total) by mouth daily.   sucralfate 1 g tablet Commonly known as:  CARAFATE Take 1 tablet (1 g total) by mouth 4 (four) times daily -  with meals and at bedtime.       Allergies  Allergen Reactions  . Ace Inhibitors     Angioedema   . Chlorthalidone     Angioedema   . Crab [Shellfish Allergy] Anaphylaxis  . Edarbi [Azilsartan] Hypertension  . Ivp Dye [Iodinated Diagnostic Agents] Anaphylaxis  . Losartan     Angioedema   . Nizatidine Anaphylaxis    Tolerates famotidine and cimetidine  . Bidil [Isosorb Dinitrate-Hydralazine]     Hand swelling  . Dilaudid [Hydromorphone Hcl] Nausea And Vomiting    Pt can tolerate morphine  . Hydrocodone-Acetaminophen Nausea And Vomiting  . Oxycodone-Acetaminophen Nausea And Vomiting       Discharge Condition: stable CODE STATUS: full code Diet recommendation: Avoid gastric irritants, low salt  Recommendations for Outpatient Follow-up:  1. Follow up with PCP in 1 week 2. Please follow up Primary GI in 1  week 3. Repeat liver profile in 5 days      The results of significant diagnostics from this hospitalization (including imaging, microbiology, ancillary and laboratory) are listed below for reference.     Microbiology: No results found for this or any previous visit (from the past 240 hour(s)).   Labs: BNP (last 3 results) No results for input(s): BNP in the last 8760 hours. Basic Metabolic Panel: Recent Labs  Lab 12/10/17 0505 12/11/17 0533 12/12/17 0607 12/15/17 1033 12/15/17 2334  NA 141 138 140 138 140  K 3.6 3.8 3.3* 4.8 4.2  CL 108 104 105 101 105  CO2 24 25 25 27 26   GLUCOSE 92 85 106* 91 128*  BUN 28* 21 18 14 16   CREATININE 1.26* 1.36* 1.21* 1.35* 1.27*  CALCIUM 8.7* 8.6* 8.6* 8.8* 8.8*  PHOS 3.1 3.6  --   --   --    Liver Function Tests: Recent Labs  Lab 12/10/17 0505 12/11/17 0533 12/15/17 1033 12/15/17 2334  AST  --   --  121* 84*  ALT  --   --  346* 275*  ALKPHOS  --   --  125 122  BILITOT  --   --  0.9 0.8  PROT  --   --  6.5 6.0*  ALBUMIN 2.8* 2.7* 3.3* 3.0*   Recent Labs  Lab 12/15/17 1033  LIPASE 26   No results for input(s): AMMONIA in the last 168 hours. CBC: Recent Labs  Lab 12/12/17 0607 12/15/17 1033 12/15/17 2334  WBC 20.4* 19.1* 18.3*  NEUTROABS 12.3*  --   --   HGB 14.2 14.7 13.5  HCT 43.2 47.1* 43.2  MCV 84.4 87.2 87.1  PLT 258 263 243   Cardiac Enzymes: Recent Labs  Lab 12/15/17 2334  TROPONINI 0.06*   BNP: Invalid input(s): POCBNP CBG: No results for input(s): GLUCAP in the last 168 hours. D-Dimer No results for input(s): DDIMER in the last 72 hours. Hgb A1c No results for input(s): HGBA1C in the last 72 hours. Lipid Profile No results for input(s): CHOL, HDL, LDLCALC, TRIG, CHOLHDL, LDLDIRECT in the last 72 hours. Thyroid function studies No results for input(s): TSH, T4TOTAL, T3FREE, THYROIDAB in the last 72 hours.  Invalid input(s): FREET3 Anemia work up No results for input(s): VITAMINB12, FOLATE,  FERRITIN, TIBC, IRON, RETICCTPCT in the last 72 hours. Urinalysis  Component Value Date/Time   COLORURINE YELLOW 12/16/2017 0450   APPEARANCEUR CLOUDY (A) 12/16/2017 0450   LABSPEC 1.021 12/16/2017 0450   PHURINE 6.0 12/16/2017 0450   GLUCOSEU NEGATIVE 12/16/2017 0450   HGBUR MODERATE (A) 12/16/2017 0450   BILIRUBINUR NEGATIVE 12/16/2017 0450   BILIRUBINUR n 03/09/2017 0827   KETONESUR NEGATIVE 12/16/2017 0450   PROTEINUR NEGATIVE 12/16/2017 0450   UROBILINOGEN negative (A) 03/09/2017 0827   NITRITE POSITIVE (A) 12/16/2017 0450   LEUKOCYTESUR LARGE (A) 12/16/2017 0450   Sepsis Labs Invalid input(s): PROCALCITONIN,  WBC,  LACTICIDVEN Microbiology No results found for this or any previous visit (from the past 240 hour(s)).  Procedures/Studies: Ct Abdomen Wo Contrast  Result Date: 12/15/2017 CLINICAL DATA:  Shortness of breath. Nausea and vomiting. EXAM: CT CHEST, ABDOMEN AND PELVIS WITHOUT CONTRAST TECHNIQUE: Multidetector CT imaging of the chest, abdomen and pelvis was performed following the standard protocol without IV contrast. COMPARISON:  Chest x-ray dated 12/15/2017 and CT scan of the abdomen dated 03/24/2017 FINDINGS: CT CHEST FINDINGS Cardiovascular: No significant vascular findings. Normal heart size. No pericardial effusion. Mediastinum/Nodes: No enlarged mediastinal, hilar, or axillary lymph nodes. Thyroid gland, trachea, and esophagus demonstrate no significant findings. Tiny hiatal hernia. Lungs/Pleura: Lungs are clear. No pleural effusion or pneumothorax. Musculoskeletal: No chest wall mass or suspicious bone lesions identified. CT ABDOMEN PELVIS FINDINGS Hepatobiliary: No focal liver abnormality is seen. Status post cholecystectomy. No biliary dilatation. Pancreas: Unremarkable. No pancreatic ductal dilatation or surrounding inflammatory changes. Spleen: Normal in size without focal abnormality. Adrenals/Urinary Tract: Adrenal glands are normal. Multiple chronic prominent  bilateral renal cysts. No hydronephrosis. Ureters and bladder are normal. Stomach/Bowel: Stomach is within normal limits except for a tiny hiatal hernia. Appendix has been removed. No evidence of bowel wall thickening, distention, or inflammatory changes. Vascular/Lymphatic: No significant vascular findings are present. No enlarged abdominal or pelvic lymph nodes. Reproductive: Uterus and bilateral adnexa are unremarkable. Other: There's a small amount of ascites in the pelvis. Musculoskeletal: No acute or significant osseous findings. IMPRESSION: 1. No acute abnormalities of the chest. 2. Small amount of new ascites in the pelvis, nonspecific. 3. Multiple chronic prominent bilateral renal cysts. Electronically Signed   By: Lorriane Shire M.D.   On: 12/15/2017 14:03   Dg Chest 2 View  Result Date: 12/15/2017 CLINICAL DATA:  Recent onset of severe mid chest pain. EXAM: CHEST - 2 VIEW COMPARISON:  Radiographs 12/05/2017 and 11/19/2017. FINDINGS: Interval extubation and removal of the enteric tube. The heart size and mediastinal contours are stable. The lungs are now clear without residual edema. There is no pleural effusion or pneumothorax. No osseous abnormalities are seen. IMPRESSION: No active cardiopulmonary process. Electronically Signed   By: Richardean Sale M.D.   On: 12/15/2017 10:48   Dg Chest 2 View  Result Date: 11/19/2017 CLINICAL DATA:  Pt began a new BP drug this morning and shortly after began having SOB, dizziness, blurred vision, increased BP - hx of htn, GERD, MVP, sleep apnea EXAM: CHEST - 2 VIEW COMPARISON:  10/01/2017 FINDINGS: Heart size is accentuated by AP position of the patient. There are no focal consolidations or pleural effusions. No pulmonary edema. Mild midthoracic spondylosis. IMPRESSION: No evidence for acute cardiopulmonary abnormality. Electronically Signed   By: Nolon Nations M.D.   On: 11/19/2017 11:21   Ct Head Wo Contrast  Result Date: 11/19/2017 CLINICAL DATA:   Headache with blurred vision. Photophobia. Hypertension EXAM: CT HEAD WITHOUT CONTRAST TECHNIQUE: Contiguous axial images were obtained from the base of  the skull through the vertex without intravenous contrast. COMPARISON:  Head CT Jun 01, 2013 and brain MRI June 22, 2013 FINDINGS: Brain: The ventricles are normal in size and configuration. There is no evident intracranial mass, hemorrhage, extra-axial fluid collection, or midline shift. There is mild small vessel disease in the centra semiovale bilaterally. Elsewhere brain parenchyma appears unremarkable. No acute infarct is appreciable. Vascular: No hyperdense vessel.  No evident vascular calcification. Skull: Bony calvarium appears intact. Sinuses/Orbits: There is a retention cyst in the inferior left maxillary antrum. There is mucosal thickening involving multiple ethmoid air cells. Orbits appear symmetric bilaterally. Other: Visualized mastoid air cells are clear. IMPRESSION: Mild periventricular small vessel disease. No acute infarct. No mass or hemorrhage. Areas of paranasal sinus disease noted. Electronically Signed   By: Lowella Grip III M.D.   On: 11/19/2017 11:32   Ct Chest Wo Contrast  Result Date: 12/15/2017 CLINICAL DATA:  Shortness of breath. Nausea and vomiting. EXAM: CT CHEST, ABDOMEN AND PELVIS WITHOUT CONTRAST TECHNIQUE: Multidetector CT imaging of the chest, abdomen and pelvis was performed following the standard protocol without IV contrast. COMPARISON:  Chest x-ray dated 12/15/2017 and CT scan of the abdomen dated 03/24/2017 FINDINGS: CT CHEST FINDINGS Cardiovascular: No significant vascular findings. Normal heart size. No pericardial effusion. Mediastinum/Nodes: No enlarged mediastinal, hilar, or axillary lymph nodes. Thyroid gland, trachea, and esophagus demonstrate no significant findings. Tiny hiatal hernia. Lungs/Pleura: Lungs are clear. No pleural effusion or pneumothorax. Musculoskeletal: No chest wall mass or suspicious bone  lesions identified. CT ABDOMEN PELVIS FINDINGS Hepatobiliary: No focal liver abnormality is seen. Status post cholecystectomy. No biliary dilatation. Pancreas: Unremarkable. No pancreatic ductal dilatation or surrounding inflammatory changes. Spleen: Normal in size without focal abnormality. Adrenals/Urinary Tract: Adrenal glands are normal. Multiple chronic prominent bilateral renal cysts. No hydronephrosis. Ureters and bladder are normal. Stomach/Bowel: Stomach is within normal limits except for a tiny hiatal hernia. Appendix has been removed. No evidence of bowel wall thickening, distention, or inflammatory changes. Vascular/Lymphatic: No significant vascular findings are present. No enlarged abdominal or pelvic lymph nodes. Reproductive: Uterus and bilateral adnexa are unremarkable. Other: There's a small amount of ascites in the pelvis. Musculoskeletal: No acute or significant osseous findings. IMPRESSION: 1. No acute abnormalities of the chest. 2. Small amount of new ascites in the pelvis, nonspecific. 3. Multiple chronic prominent bilateral renal cysts. Electronically Signed   By: Lorriane Shire M.D.   On: 12/15/2017 14:03   US Renal  Result Date: 12/08/2017 CLINICAL DATA:  Pyelonephritis. EXAM: RENAL / URINARY TRACT ULTRASOUND COMPLETE COMPARISON:  Body CT 03/24/2017 FINDINGS: Right Kidney: Renal measurements: 11.0 x 5.8 x 5.8 = volume: 195 mL . Echogenicity within normal limits. No hydronephrosis visualized. Three predominant benign-appearing cysts in the upper pole of the right kidney measuring 5.9, 4.5 and 4.4 cm in diameter. Left Kidney: Renal measurements: 12.4 x 6.9 x 6.2 = volume: 276 mL. Echogenicity within normal limits. No hydronephrosis visualized. Three dominant cysts in the left kidney with a large cyst in the left renal pelvis measuring 8.0 x 7.6 x 6.9 cm, and 2 benign-appearing cysts within the cortex of the midpolar region measuring 6.9 and 3.7 cm in diameter. Bladder: Decompressed around  urinary Foley. IMPRESSION: No evidence of hydronephrosis bilaterally. Bilateral simple appearing renal cysts. The largest cyst in the midpolar region of the left kidney measures 8 cm. Electronically Signed   By: Fidela Salisbury M.D.   On: 12/08/2017 12:45   Mr Jodene Nam Abdomen W Wo Contrast  Result Date:  12/10/2017 CLINICAL DATA:  Uncontrolled hypertension EXAM: MRA ABDOMEN WITH CONTRAST TECHNIQUE: Multiplanar, multiecho pulse sequences of the abdomen were obtained with intravenous contrast. Angiographic images of abdomen were obtained using MRA technique with intravenous contrast. CONTRAST:  7.5 cc Gadavist COMPARISON:  None. FINDINGS: Vascular: Aorta is nonaneurysmal and patent. Celiac is patent. SMA origin is patent. There is mild narrowing just beyond the origin. Single renal arteries are widely patent. There is no convincing evidence of fibromuscular dysplasia. IMA is diminutive and patent. Bilateral common iliac, external iliac, and internal iliac arteries are patent. Nonvascular: Liver is unremarkable.  Postcholecystectomy. Pancreas and spleen are within normal limits. Several benign appearing cysts are scattered throughout the kidneys. Adrenal glands are within normal limits. Visualized bowel is within normal limits. No free fluid.  No abnormal retroperitoneal adenopathy. IMPRESSION: No evidence of renal artery stenosis. Electronically Signed   By: Marybelle Killings M.D.   On: 12/10/2017 07:40   Dg Chest Port 1 View  Result Date: 12/05/2017 CLINICAL DATA:  Acute respiratory failure, intubated EXAM: PORTABLE CHEST 1 VIEW COMPARISON:  11/19/2017 chest radiograph. FINDINGS: Endotracheal tube tip is 1.6 cm above the carina. Enteric tube enters stomach with the tip not seen on this image. Stable cardiomediastinal silhouette with mild cardiomegaly. No pneumothorax. Possible small left pleural effusion. No right pleural effusion. Borderline mild pulmonary edema. IMPRESSION: 1. Endotracheal tube tip is 1.6 cm  above the carina, consider retracting 1 cm. Well-positioned enteric tube. 2. Mild cardiomegaly with borderline mild pulmonary edema. Possible small left pleural effusion. Electronically Signed   By: Ilona Sorrel M.D.   On: 12/05/2017 12:09   US Liver Doppler  Result Date: 12/15/2017 CLINICAL DATA:  Evaluate hepatic vasculature. EXAM: 1. ULTRASOUND ABDOMEN LIMITED RIGHT UPPER QUADRANT 2. HEPATIC DOPPLER ULTRASOUND COMPARISON:  CT the chest, abdomen and pelvis-12/15/2017; abdominal MRI - 12/10/2017 FINDINGS: Gallbladder: Surgically absent Common bile duct: Diameter: Normal in size measuring 3.7 mm in diameter. Liver: No focal lesion identified. Within normal limits in parenchymal echogenicity. Portal vein is patent on color Doppler imaging with normal direction of blood flow towards the liver. Liver: There is diffuse increased coarsened echogenicity of the hepatic parenchyma. No discrete hepatic lesions. No intrahepatic biliary ductal dilatation. _________________________________________________________ Portal Vein Velocities Main:  25.7 cm/sec Right:  18.1 cm/sec Left:  17.0 cm/sec Hepatic Vein Velocities Right:  42.7 cm/sec Middle:  29.5 cm/sec Left:  15.4 cm/sec IVC: Present and patent with normal respiratory phasicity. Hepatic Artery Velocity: 153.9 cm/sec Splenic Vein Velocity: 30.7 cm/sec Varices: None visualized Ascites: None visualized Spleen: Normal in size measuring 9.7 x 4.7 x 2.9 cm (calculated volume of 68.8 cc). Incidentally noted right-sided renal cysts as demonstrated on preceding abdominal CT and MRI. IMPRESSION: 1. Findings suggestive of hepatic steatosis. 2. Otherwise, normal hepatic Doppler without evidence of portal venous hypertension. No ascites. 3. Post cholecystectomy. 4. No evidence of intra or extrahepatic biliary duct dilatation. 5. Incidentally noted right-sided renal cysts as demonstrated on preceding abdominal CT and MRI. Electronically Signed   By: Sandi Mariscal M.D.   On: 12/15/2017  18:23    Time coordinating discharge: Over 30 minutes Of note, patient admitted as inpatient but downgraded to observation status prior to discharge due to significant improvement in symptoms and negative work-up (patient made aware)  SIGNED:   Guilford Shi, MD  [facility] 12/16/2017, 3:05 PM Pager   If 7PM-7AM, please contact night-coverage [email or web address] Password [email or web address]

## 2017-12-16 NOTE — Progress Notes (Signed)
Pt discharged going home via wheelchair accompanied by her daughter and mother-in-law, no complain of pain at this time, alert and oriented.

## 2017-12-22 DIAGNOSIS — I10 Essential (primary) hypertension: Secondary | ICD-10-CM | POA: Diagnosis not present

## 2017-12-22 DIAGNOSIS — R945 Abnormal results of liver function studies: Secondary | ICD-10-CM | POA: Diagnosis not present

## 2017-12-22 DIAGNOSIS — N183 Chronic kidney disease, stage 3 (moderate): Secondary | ICD-10-CM | POA: Diagnosis not present

## 2017-12-22 DIAGNOSIS — T783XXD Angioneurotic edema, subsequent encounter: Secondary | ICD-10-CM | POA: Diagnosis not present

## 2017-12-22 DIAGNOSIS — Z87898 Personal history of other specified conditions: Secondary | ICD-10-CM | POA: Diagnosis not present

## 2017-12-22 DIAGNOSIS — I129 Hypertensive chronic kidney disease with stage 1 through stage 4 chronic kidney disease, or unspecified chronic kidney disease: Secondary | ICD-10-CM | POA: Diagnosis not present

## 2017-12-22 DIAGNOSIS — E785 Hyperlipidemia, unspecified: Secondary | ICD-10-CM | POA: Diagnosis not present

## 2017-12-22 DIAGNOSIS — K219 Gastro-esophageal reflux disease without esophagitis: Secondary | ICD-10-CM | POA: Diagnosis not present

## 2017-12-23 DIAGNOSIS — T783XXA Angioneurotic edema, initial encounter: Secondary | ICD-10-CM | POA: Diagnosis not present

## 2017-12-27 LAB — ALDOSTERONE + RENIN ACTIVITY W/ RATIO
ALDO / PRA Ratio: <4.7 (ref 0.0–30.0)
Aldosterone: <1 ng/dL (ref 0.0–30.0)
PRA LC/MS/MS: 0.211 ng/mL/h (ref 0.167–5.380)

## 2017-12-27 MED FILL — ESOMEPRAZOLE MAG DR 40 MG C: 40 | 90 days supply | Qty: 90 | Fill #3

## 2018-01-14 MED FILL — SPIRONOLACTONE 100 MG TAB: 100 | 30 days supply | Qty: 30 | Fill #0

## 2018-01-14 MED FILL — cloNIDine 0.3 MG/24HR PTWK: 0.3 | 28 days supply | Qty: 4 | Fill #0

## 2018-01-14 MED FILL — FUROSEMIDE 20 MG TABS: 20 | 30 days supply | Qty: 30 | Fill #0

## 2018-01-14 MED FILL — METOPROLOL TARTRATE 50 MG T: 50 | 30 days supply | Qty: 60 | Fill #0

## 2018-01-24 DIAGNOSIS — G4733 Obstructive sleep apnea (adult) (pediatric): Secondary | ICD-10-CM | POA: Diagnosis not present

## 2018-01-26 ENCOUNTER — Ambulatory Visit
Admit: 2018-01-26 | Discharge: 2018-01-26 | Disposition: A | Discharge status: Home / Self Care | Payer: 59 | Attending: Internal Medicine | Admitting: Internal Medicine

## 2018-01-26 ENCOUNTER — Ambulatory Visit
Admit: 2018-01-26 | Discharge: 2018-01-26 | Disposition: A | Discharge status: Home / Self Care | Payer: 59 | Attending: Obstetrics & Gynecology | Admitting: Obstetrics & Gynecology

## 2018-01-26 DIAGNOSIS — Z13820 Encounter for screening for osteoporosis: Secondary | ICD-10-CM | POA: Diagnosis not present

## 2018-01-26 DIAGNOSIS — E2839 Other primary ovarian failure: Secondary | ICD-10-CM

## 2018-01-26 DIAGNOSIS — Z139 Encounter for screening, unspecified: Secondary | ICD-10-CM

## 2018-01-26 DIAGNOSIS — Z780 Asymptomatic menopausal state: Secondary | ICD-10-CM | POA: Diagnosis not present

## 2018-01-26 DIAGNOSIS — Z1231 Encounter for screening mammogram for malignant neoplasm of breast: Secondary | ICD-10-CM | POA: Diagnosis not present

## 2018-01-28 DIAGNOSIS — Z87898 Personal history of other specified conditions: Secondary | ICD-10-CM | POA: Diagnosis not present

## 2018-01-28 DIAGNOSIS — I129 Hypertensive chronic kidney disease with stage 1 through stage 4 chronic kidney disease, or unspecified chronic kidney disease: Secondary | ICD-10-CM | POA: Diagnosis not present

## 2018-01-28 DIAGNOSIS — N183 Chronic kidney disease, stage 3 (moderate): Secondary | ICD-10-CM | POA: Diagnosis not present

## 2018-01-28 DIAGNOSIS — E785 Hyperlipidemia, unspecified: Secondary | ICD-10-CM | POA: Diagnosis not present

## 2018-02-18 MED FILL — FUROSEMIDE 20 MG TABS: 20 | 90 days supply | Qty: 90 | Fill #0

## 2018-02-18 MED FILL — METOPROLOL TARTRATE 50 MG T: 50 | 90 days supply | Qty: 180 | Fill #0

## 2018-02-18 MED FILL — SPIRONOLACTONE 100 MG TAB: 100 | 90 days supply | Qty: 90 | Fill #0

## 2018-02-18 MED FILL — cloNIDine 0.3 MG/24HR PTWK: 0.3 | 28 days supply | Qty: 4 | Fill #0

## 2018-02-22 DIAGNOSIS — K921 Melena: Secondary | ICD-10-CM | POA: Diagnosis not present

## 2018-02-22 DIAGNOSIS — I10 Essential (primary) hypertension: Secondary | ICD-10-CM | POA: Diagnosis not present

## 2018-02-22 DIAGNOSIS — K219 Gastro-esophageal reflux disease without esophagitis: Secondary | ICD-10-CM | POA: Diagnosis not present

## 2018-02-22 DIAGNOSIS — M545 Low back pain: Secondary | ICD-10-CM | POA: Diagnosis not present

## 2018-02-22 DIAGNOSIS — K760 Fatty (change of) liver, not elsewhere classified: Secondary | ICD-10-CM | POA: Diagnosis not present

## 2018-03-01 DIAGNOSIS — G4733 Obstructive sleep apnea (adult) (pediatric): Secondary | ICD-10-CM | POA: Diagnosis not present

## 2018-03-01 DIAGNOSIS — K760 Fatty (change of) liver, not elsewhere classified: Secondary | ICD-10-CM | POA: Diagnosis not present

## 2018-03-01 DIAGNOSIS — T783XXA Angioneurotic edema, initial encounter: Secondary | ICD-10-CM | POA: Diagnosis not present

## 2018-03-01 DIAGNOSIS — I10 Essential (primary) hypertension: Secondary | ICD-10-CM | POA: Diagnosis not present

## 2018-03-01 DIAGNOSIS — K219 Gastro-esophageal reflux disease without esophagitis: Secondary | ICD-10-CM | POA: Diagnosis not present

## 2018-03-02 MED FILL — cloNIDine 0.2 MG/24HR PTWK: 0.2 | 28 days supply | Qty: 4 | Fill #0 | Status: TO

## 2018-03-25 MED FILL — ESOMEPRAZOLE MAG DR 40 MG C: 40 | 60 days supply | Qty: 120 | Fill #0

## 2018-03-25 MED FILL — FAMOTIDINE 20 MG TABLET: 20 | 30 days supply | Qty: 30 | Fill #0

## 2018-03-30 ENCOUNTER — Ambulatory Visit: Payer: 59 | Admitting: Certified Nurse Midwife

## 2018-03-31 MED FILL — cloNIDine 0.2 MG/24HR PTWK: 0.2 | 28 days supply | Qty: 4 | Fill #0

## 2018-04-26 DIAGNOSIS — H524 Presbyopia: Secondary | ICD-10-CM | POA: Diagnosis not present

## 2018-04-26 DIAGNOSIS — I10 Essential (primary) hypertension: Secondary | ICD-10-CM | POA: Diagnosis not present

## 2018-04-26 DIAGNOSIS — D3131 Benign neoplasm of right choroid: Secondary | ICD-10-CM | POA: Diagnosis not present

## 2018-04-26 DIAGNOSIS — H52223 Regular astigmatism, bilateral: Secondary | ICD-10-CM | POA: Diagnosis not present

## 2018-04-26 DIAGNOSIS — H04123 Dry eye syndrome of bilateral lacrimal glands: Secondary | ICD-10-CM | POA: Diagnosis not present

## 2018-04-26 DIAGNOSIS — H35033 Hypertensive retinopathy, bilateral: Secondary | ICD-10-CM | POA: Diagnosis not present

## 2018-04-26 DIAGNOSIS — H5203 Hypermetropia, bilateral: Secondary | ICD-10-CM | POA: Diagnosis not present

## 2018-04-26 DIAGNOSIS — H25813 Combined forms of age-related cataract, bilateral: Secondary | ICD-10-CM | POA: Diagnosis not present

## 2018-05-02 DIAGNOSIS — G4733 Obstructive sleep apnea (adult) (pediatric): Secondary | ICD-10-CM | POA: Diagnosis not present

## 2018-05-16 MED FILL — cloNIDine 0.2 MG/24HR PTWK: 0.2 | 28 days supply | Qty: 4 | Fill #1

## 2018-06-06 MED FILL — SPIRONOLACTONE 100 MG TAB: 100 | 90 days supply | Qty: 90 | Fill #1

## 2018-06-16 MED FILL — cloNIDine 0.2 MG/24HR PTWK: 0.2 | 28 days supply | Qty: 4 | Fill #0

## 2018-06-21 NOTE — Progress Notes (Signed)
[name] Married  African American Fe here for annual exam. Menopausal no HRT. Denies vaginal bleeding or vaginal dryness. Patient was hospitalized for severe allergic reaction to Lisopril and on Vent for 2 days. Now seeing Dr. Hollie Salk for management. Sees Dr. Inda Merlin for aex , labs and Vitamin D management. All stable now. No health issues today.  Patient's last menstrual period was 07/05/2009.          Sexually active: Yes.    The current method of family planning is post menopausal status.    Exercising: Yes.    walking Smoker:  no  Review of Systems  Constitutional: Negative.   HENT: Negative.   Eyes: Negative.   Respiratory: Negative.   Cardiovascular: Negative.   Gastrointestinal: Negative.   Genitourinary: Negative.   Musculoskeletal: Negative.   Skin:       Bump near pubic area  Neurological: Negative.   Endo/Heme/Allergies: Negative.   Psychiatric/Behavioral: Negative.     Health Maintenance: Pap:  03-03-16 neg History of Abnormal Pap: yes MMG:  01-26-2018 category c density birads 1:neg Self Breast exams: yes Colonoscopy:  2019 f/u 7yrs per patient BMD:   2020 TDaP:  2012 Shingles: had done Pneumonia: had done Hep C and HIV: HIV neg 2019 Labs: with PCP   reports that she has never smoked. She has never used smokeless tobacco. She reports that she does not drink alcohol or use drugs.  Past Medical History:  Diagnosis Date  . Abnormal Pap smear of cervix   . Anemia    as a child  . Anxiety   . Bulging lumbar disc   . GERD (gastroesophageal reflux disease)   . Hypertension   . Lactose intolerance   . MVP (mitral valve prolapse)   . Pyelonephritis   . Renal disorder   . Sleep apnea   . Spinal stenosis     Past Surgical History:  Procedure Laterality Date  . ankle fracture  10/09   bilateral-right was surgically repaired  . ANKLE SURGERY  10/10   screws removed  . ANKLE SURGERY  2/12   removal of all hardware right ankle  . APPENDECTOMY    . arm  surgery     plate in left arm  . BREAST LUMPECTOMY     right breast  . CHOLECYSTECTOMY    . DILATION AND CURETTAGE OF UTERUS      Current Outpatient Medications  Medication Sig Dispense Refill  . albuterol (PROVENTIL HFA;VENTOLIN HFA) 108 (90 Base) MCG/ACT inhaler Inhale 1-2 puffs into the lungs every 6 (six) hours as needed for wheezing or shortness of breath. 1 Inhaler 0  . Calcium-Magnesium-Vitamin D (CALCIUM 1200+D3) 600-40-500 MG-MG-UNIT TB24 Take 3 tablets by mouth daily.    . diclofenac sodium (VOLTAREN) 1 % GEL Apply 2 g topically as needed. To affected joint  5  . diphenhydrAMINE (BENADRYL) 25 mg capsule Take 25 mg by mouth every 6 (six) hours as needed.    . diphenhydrAMINE-zinc acetate (BENADRYL EXTRA STRENGTH) cream Apply 1 application topically 3 (three) times daily as needed for itching (Apply on hands). 28.4 g 0  . esomeprazole (NEXIUM) 40 MG capsule Take 40 mg by mouth daily.     . famotidine (PEPCID) 20 MG tablet Take 1 tablet (20 mg total) by mouth 2 (two) times daily. 14 tablet 0  . furosemide (LASIX) 20 MG tablet     . Liniments (SALONPAS EX) Apply 1 each topically daily as needed (pain).    . metoprolol  tartrate (LOPRESSOR) 50 MG tablet Take 1 tablet (50 mg total) by mouth 2 (two) times daily. 60 tablet 0  . ondansetron (ZOFRAN) 4 MG tablet Take 1 tablet (4 mg total) by mouth every 6 (six) hours as needed for nausea. 20 tablet 0  . spironolactone (ALDACTONE) 100 MG tablet Take 1 tablet (100 mg total) by mouth daily. 30 tablet 0  . nitroGLYCERIN (NITROSTAT) 0.4 MG SL tablet Place 1 tablet (0.4 mg total) under the tongue every 5 (five) minutes as needed for chest pain. (Patient not taking: Reported on 06/22/2018) 30 tablet 12   No current facility-administered medications for this visit.     Family History  Problem Relation Age of Onset  . Diabetes Maternal Grandmother   . Stroke Maternal Grandmother   . Kidney disease Maternal Grandmother   . Ovarian cancer Paternal  Grandmother        dx. late 30s-early 70s  . Prostate cancer Paternal Grandfather   . Ovarian cancer Paternal Aunt        (x3) paternal aunts dx. ages 74s-late 70s  . Breast cancer Maternal Aunt        dx. 61s  . Hypertension Mother   . Heart disease Mother   . Goiter Mother   . Other Mother        breast lumpectomy in early 42s, was in the hospital for 5 days after  . Heart disease Father   . Prostate cancer Father 42       s/p orchiectomy  . Stroke Father   . Ulcers Brother   . Other Brother        elevated PSA level w/ surveillance  . Colon polyps Brother        less than 10  . Pyelonephritis Daughter   . Breast cancer Sister 41       left breast ca - poorly differentiated carcinoma with squamous metaplasia; triple neg; has since had BL mastectomies  . Colon polyps Sister        less than 10  . Congestive Heart Failure Sister   . Prostate cancer Paternal Uncle        (x5) paternal uncles dx. prostate cancer in their 55s  . Prostate cancer Maternal Grandfather        d. late 62s with mets to colon  . Other Other 39       NOS benign brain tumor; +fluid  . Prostate cancer Cousin 13       paternal 1st cousin   . Prostate cancer Cousin        paternal 1st cousin, once-removed dx. 29s  . Prostate cancer Cousin        (x2) paternal 1st cousins, once-removed, dx. early 60s  . Lung cancer Brother 67       paternal half-brother; smoker  . Uterine cancer Maternal Aunt        dx. 30s  . Breast cancer Maternal Aunt        dx. bilateral breast cancers - 57s, 3s  . Liver cancer Maternal Aunt        +hepatitis C; d. 55s  . Congestive Heart Failure Maternal Uncle 68  . Stroke Maternal Uncle        d. 19s; (x2 maternal uncles)  . Cancer Cousin 52       sinus cancer dx. 35s (maternal 1st cousin)  . Lung cancer Cousin        maternal 1st cousin dx. 60s/smoker; maternal 1st cousin  .  Pancreatic cancer Cousin        maternal 1st cousin dx. late 56s  . Prostate cancer Cousin         maternal 1st cousin dx. 83  . Breast cancer Cousin        (x4) maternal 1st cousins dx. late 30s, 19s    ROS:  Pertinent items are noted in HPI.  Otherwise, a comprehensive ROS was negative.  Exam:   BP 118/78   Pulse 64   Temp (!) 97.4 F (36.3 C) (Skin)   Resp 16   Ht 5' 7.75" (1.721 m)   Wt 231 lb (104.8 kg)   LMP 07/05/2009   BMI 35.38 kg/m  Height: 5' 7.75" (172.1 cm) Ht Readings from Last 3 Encounters:  06/22/18 5' 7.75" (1.721 m)  12/15/17 5\' 9"  (1.753 m)  12/11/17 5\' 9"  (1.753 m)    General appearance: alert, cooperative and appears stated age Head: Normocephalic, without obvious abnormality, atraumatic Neck: no adenopathy, supple, symmetrical, trachea midline and thyroid normal to inspection and palpation Lungs: clear to auscultation bilaterally Breasts: normal appearance, no masses or tenderness, No nipple retraction or dimpling, No nipple discharge or bleeding, No axillary or supraclavicular adenopathy Heart: regular rate and rhythm Abdomen: soft, non-tender; no masses,  no organomegaly Extremities: extremities normal, atraumatic, no cyanosis or edema Skin: Skin color, texture, turgor normal. No rashes or lesions Lymph nodes: Cervical, supraclavicular, and axillary nodes normal. No abnormal inguinal nodes palpated Neurologic: Grossly normal   Pelvic: External genitalia:  no lesions              Urethra:  normal appearing urethra with no masses, tenderness or lesions              Bartholin's and Skene's: normal                 Vagina: normal appearing vagina with normal color and discharge, no lesions              Cervix: no cervical motion tenderness, no lesions and retroverted              Pap taken: Yes.   Bimanual Exam:  Uterus:  normal size, contour, position, consistency, mobility, non-tender and anteverted              Adnexa: normal adnexa and no mass, fullness, tenderness               Rectovaginal: Confirms               Anus:  normal sphincter  tone, no lesions  Chaperone present: yes  A:  Well Woman with normal exam  Post menopausal no HRT  History of severe medication allergy on Vent x 2 days ( hypertension medication) under specialist for management now  PCP management of aex, labs  P:   Reviewed health and wellness pertinent to exam  Aware of need to advise if vaginal bleeding  Continue follow up as indicated  Pap smear: yes   counseled on breast self exam, mammography screening, feminine hygiene, adequate intake of calcium and vitamin D, diet and exercise, Kegel's exercises  return annually or prn  An After Visit Summary was printed and given to the patient.

## 2018-06-22 ENCOUNTER — Other Ambulatory Visit (HOSPITAL_COMMUNITY)
Admission: RE | Admit: 2018-06-22 | Discharge: 2018-06-22 | Disposition: A | Discharge status: Home / Self Care | Payer: 59 | Source: Ambulatory Visit | Attending: Certified Nurse Midwife | Admitting: Certified Nurse Midwife

## 2018-06-22 ENCOUNTER — Ambulatory Visit: Payer: 59 | Admitting: Certified Nurse Midwife

## 2018-06-22 ENCOUNTER — Encounter: Payer: Self-pay | Admitting: Certified Nurse Midwife

## 2018-06-22 ENCOUNTER — Other Ambulatory Visit: Payer: Self-pay

## 2018-06-22 VITALS — BP 118/78 | HR 64 | Temp 97.4°F | Resp 16 | Ht 67.75 in | Wt 231.0 lb

## 2018-06-22 DIAGNOSIS — Z780 Asymptomatic menopausal state: Secondary | ICD-10-CM | POA: Diagnosis not present

## 2018-06-22 DIAGNOSIS — Z01419 Encounter for gynecological examination (general) (routine) without abnormal findings: Secondary | ICD-10-CM

## 2018-06-22 DIAGNOSIS — Z8679 Personal history of other diseases of the circulatory system: Secondary | ICD-10-CM

## 2018-06-22 DIAGNOSIS — Z124 Encounter for screening for malignant neoplasm of cervix: Secondary | ICD-10-CM

## 2018-06-24 LAB — CYTOLOGY - PAP
Diagnosis: NEGATIVE
HPV: NOT DETECTED

## 2018-06-30 MED FILL — SM BLOOD PRESSURE MONITOR: 30 days supply | Qty: 1 | Fill #0

## 2018-06-30 MED FILL — METOPROLOL TARTRATE 50 MG T: 50 | 90 days supply | Qty: 180 | Fill #1

## 2018-07-21 MED FILL — AZITHROMYCIN 250 MG TABLET: 250 | 5 days supply | Qty: 6 | Fill #0

## 2018-07-22 MED FILL — cloNIDine 0.2 MG/24HR PTWK: 0.2 | 84 days supply | Qty: 12 | Fill #0

## 2018-07-26 DIAGNOSIS — R21 Rash and other nonspecific skin eruption: Secondary | ICD-10-CM | POA: Diagnosis not present

## 2018-07-26 MED FILL — predniSONE 10 MG TABS: 10 | 6 days supply | Qty: 21 | Fill #0

## 2018-07-27 DIAGNOSIS — I129 Hypertensive chronic kidney disease with stage 1 through stage 4 chronic kidney disease, or unspecified chronic kidney disease: Secondary | ICD-10-CM | POA: Diagnosis not present

## 2018-07-27 DIAGNOSIS — E785 Hyperlipidemia, unspecified: Secondary | ICD-10-CM | POA: Diagnosis not present

## 2018-07-27 DIAGNOSIS — Z87898 Personal history of other specified conditions: Secondary | ICD-10-CM | POA: Diagnosis not present

## 2018-07-27 DIAGNOSIS — N183 Chronic kidney disease, stage 3 (moderate): Secondary | ICD-10-CM | POA: Diagnosis not present

## 2018-07-29 DIAGNOSIS — I129 Hypertensive chronic kidney disease with stage 1 through stage 4 chronic kidney disease, or unspecified chronic kidney disease: Secondary | ICD-10-CM | POA: Diagnosis not present

## 2018-07-29 DIAGNOSIS — E785 Hyperlipidemia, unspecified: Secondary | ICD-10-CM | POA: Diagnosis not present

## 2018-08-02 DIAGNOSIS — G4733 Obstructive sleep apnea (adult) (pediatric): Secondary | ICD-10-CM | POA: Diagnosis not present

## 2018-08-18 MED FILL — ESOMEPRAZOLE MAG DR 40 MG C: 40 | 60 days supply | Qty: 120 | Fill #1

## 2018-09-02 DIAGNOSIS — G4733 Obstructive sleep apnea (adult) (pediatric): Secondary | ICD-10-CM | POA: Diagnosis not present

## 2018-09-06 MED FILL — FUROSEMIDE 20 MG TABS: 20 | 90 days supply | Qty: 90 | Fill #1

## 2018-09-06 MED FILL — SM ACID REDUCER 20 MG TAB: 20 | 25 days supply | Qty: 25 | Fill #1

## 2018-09-06 MED FILL — SPIRONOLACTONE 100 MG TAB: 100 | 90 days supply | Qty: 90 | Fill #2

## 2018-10-03 DIAGNOSIS — H00022 Hordeolum internum right lower eyelid: Secondary | ICD-10-CM | POA: Diagnosis not present

## 2018-10-03 DIAGNOSIS — H0012 Chalazion right lower eyelid: Secondary | ICD-10-CM | POA: Diagnosis not present

## 2018-10-03 MED FILL — DOXYCYCLINE HYC 100 MG CAPS: 100 | 10 days supply | Qty: 20 | Fill #0

## 2018-11-14 DIAGNOSIS — G4733 Obstructive sleep apnea (adult) (pediatric): Secondary | ICD-10-CM | POA: Diagnosis not present

## 2018-11-18 MED FILL — METOPROLOL TARTRATE 50 MG T: 50 | 90 days supply | Qty: 180 | Fill #2

## 2018-11-18 MED FILL — ESOMEPRAZOLE MAG DR 40 MG C: 40 | 60 days supply | Qty: 120 | Fill #2

## 2018-11-18 MED FILL — cloNIDine 0.2 MG/24HR PTWK: 0.2 | 84 days supply | Qty: 12 | Fill #1

## 2018-11-21 DIAGNOSIS — R05 Cough: Secondary | ICD-10-CM | POA: Diagnosis not present

## 2018-11-21 DIAGNOSIS — E559 Vitamin D deficiency, unspecified: Secondary | ICD-10-CM | POA: Diagnosis not present

## 2018-11-21 DIAGNOSIS — T783XXA Angioneurotic edema, initial encounter: Secondary | ICD-10-CM | POA: Diagnosis not present

## 2018-11-21 DIAGNOSIS — E669 Obesity, unspecified: Secondary | ICD-10-CM | POA: Diagnosis not present

## 2018-11-21 DIAGNOSIS — Z79899 Other long term (current) drug therapy: Secondary | ICD-10-CM | POA: Diagnosis not present

## 2018-11-21 DIAGNOSIS — Z0000 Encounter for general adult medical examination without abnormal findings: Secondary | ICD-10-CM | POA: Diagnosis not present

## 2018-11-21 DIAGNOSIS — Z833 Family history of diabetes mellitus: Secondary | ICD-10-CM | POA: Diagnosis not present

## 2018-11-21 DIAGNOSIS — F4320 Adjustment disorder, unspecified: Secondary | ICD-10-CM | POA: Diagnosis not present

## 2018-11-21 DIAGNOSIS — J309 Allergic rhinitis, unspecified: Secondary | ICD-10-CM | POA: Diagnosis not present

## 2018-11-21 DIAGNOSIS — G4733 Obstructive sleep apnea (adult) (pediatric): Secondary | ICD-10-CM | POA: Diagnosis not present

## 2018-11-21 DIAGNOSIS — E785 Hyperlipidemia, unspecified: Secondary | ICD-10-CM | POA: Diagnosis not present

## 2018-11-21 MED FILL — SYMBICORT 160-4.5 MCG INH: 160-4.5 | 30 days supply | Qty: 10 | Fill #0

## 2018-11-21 MED FILL — BENZONATATE 100 MG CAPS: 100 | 30 days supply | Qty: 90 | Fill #0

## 2018-12-07 DIAGNOSIS — J45909 Unspecified asthma, uncomplicated: Secondary | ICD-10-CM | POA: Diagnosis not present

## 2018-12-07 DIAGNOSIS — R05 Cough: Secondary | ICD-10-CM | POA: Diagnosis not present

## 2018-12-15 MED FILL — BENZONATATE 100 MG CAPS: 100 | 30 days supply | Qty: 60 | Fill #0

## 2018-12-23 MED FILL — SPIRONOLACTONE 100 MG TAB: 100 | 90 days supply | Qty: 90 | Fill #3

## 2019-01-12 MED FILL — CHLORHEXIDINE 0.12% RINSE: 0.12 | 16 days supply | Qty: 473 | Fill #0

## 2019-01-12 MED FILL — NAPROXEN 250 MG TABLET: 250 | 4 days supply | Qty: 16 | Fill #0

## 2019-01-12 MED FILL — AMOXICILLIN 500 MG CAPSULE: 500 | 10 days supply | Qty: 40 | Fill #0

## 2019-01-30 MED FILL — FLUCONAZOLE 150 MG TABS: 150 | 1 days supply | Qty: 1 | Fill #0

## 2019-01-31 MED FILL — CHLORHEXIDINE 0.12% RINSE: 0.12 | 16 days supply | Qty: 473 | Fill #1

## 2019-02-16 DIAGNOSIS — I129 Hypertensive chronic kidney disease with stage 1 through stage 4 chronic kidney disease, or unspecified chronic kidney disease: Secondary | ICD-10-CM | POA: Diagnosis not present

## 2019-02-16 DIAGNOSIS — Q6102 Congenital multiple renal cysts: Secondary | ICD-10-CM | POA: Diagnosis not present

## 2019-02-27 ENCOUNTER — Other Ambulatory Visit: Payer: Self-pay | Admitting: Internal Medicine

## 2019-02-27 DIAGNOSIS — Z1231 Encounter for screening mammogram for malignant neoplasm of breast: Secondary | ICD-10-CM

## 2019-03-02 ENCOUNTER — Other Ambulatory Visit (HOSPITAL_COMMUNITY): Payer: Self-pay | Admitting: Internal Medicine

## 2019-03-02 MED FILL — FUROSEMIDE 20 MG TABS: 20 | 90 days supply | Qty: 90 | Fill #0

## 2019-03-02 MED FILL — ESOMEPRAZOLE MAG DR 40 MG C: 40 | 90 days supply | Qty: 90 | Fill #0

## 2019-03-14 DIAGNOSIS — G4733 Obstructive sleep apnea (adult) (pediatric): Secondary | ICD-10-CM | POA: Diagnosis not present

## 2019-03-28 ENCOUNTER — Encounter: Payer: Self-pay | Admitting: Certified Nurse Midwife

## 2019-03-30 MED FILL — cloNIDine 0.2 MG/24HR PTWK: 0.2 | 84 days supply | Qty: 12 | Fill #2

## 2019-03-30 MED FILL — SPIRONOLACTONE 100 MG TAB: 100 | 90 days supply | Qty: 90 | Fill #0

## 2019-04-04 ENCOUNTER — Other Ambulatory Visit: Payer: Self-pay

## 2019-04-04 ENCOUNTER — Ambulatory Visit
Admission: RE | Admit: 2019-04-04 | Discharge: 2019-04-04 | Disposition: A | Discharge status: Home / Self Care | Payer: 59 | Source: Ambulatory Visit | Attending: Internal Medicine | Admitting: Internal Medicine

## 2019-04-04 DIAGNOSIS — Z1231 Encounter for screening mammogram for malignant neoplasm of breast: Secondary | ICD-10-CM | POA: Diagnosis not present

## 2019-05-01 ENCOUNTER — Other Ambulatory Visit (HOSPITAL_COMMUNITY): Payer: Self-pay | Admitting: Internal Medicine

## 2019-05-01 MED FILL — METOPROLOL TARTRATE 50 MG T: 50 | 90 days supply | Qty: 180 | Fill #0

## 2019-05-01 MED FILL — ALPRAZolam 0.25 MG TABS: 0.25 | 30 days supply | Qty: 15 | Fill #0

## 2019-06-01 MED FILL — ESOMEPRAZOLE MAG DR 40 MG C: 40 | 90 days supply | Qty: 180 | Fill #0

## 2019-06-22 DIAGNOSIS — M25512 Pain in left shoulder: Secondary | ICD-10-CM | POA: Diagnosis not present

## 2019-06-22 MED FILL — MELOXICAM 7.5 MG TABLET: 7.5 | 30 days supply | Qty: 30 | Fill #0

## 2019-07-03 ENCOUNTER — Ambulatory Visit: Payer: 59 | Admitting: Certified Nurse Midwife

## 2019-07-04 ENCOUNTER — Ambulatory Visit: Payer: 59 | Admitting: Obstetrics & Gynecology

## 2019-07-13 ENCOUNTER — Ambulatory Visit: Payer: 59 | Attending: Orthopedic Surgery

## 2019-07-13 ENCOUNTER — Other Ambulatory Visit: Payer: Self-pay

## 2019-07-13 DIAGNOSIS — M25512 Pain in left shoulder: Secondary | ICD-10-CM | POA: Diagnosis not present

## 2019-07-13 DIAGNOSIS — M25612 Stiffness of left shoulder, not elsewhere classified: Secondary | ICD-10-CM | POA: Insufficient documentation

## 2019-07-13 MED FILL — cloNIDine 0.2 MG/24HR PTWK: 0.2 | 84 days supply | Qty: 12 | Fill #3

## 2019-07-13 MED FILL — SPIRONOLACTONE 100 MG TAB: 100 | 90 days supply | Qty: 90 | Fill #1

## 2019-07-13 NOTE — Therapy (Signed)
[place], [place], [address] Phone: [phone]   Fax:  [phone]  Physical Therapy Evaluation  Patient Details  Name: [name] MRN: [id number] Date of Birth: [birth year] Referring Provider (PT): Bonnie Jakob, MD   Encounter Date: 07/13/2019   PT End of Session - 07/13/19 0710    Visit Number 1    Number of Visits 12    Date for PT Re-Evaluation 08/25/19    Authorization Type [facility] U[facility]    PT Start Time 0702    PT Stop Time 0745    PT Time Calculation (min) 43 min    Activity Tolerance Patient tolerated treatment well    Behavior During Therapy [facility] for tasks assessed/performed           Past Medical History:  Diagnosis Date  . Abnormal Pap smear of cervix   . Anemia    as a child  . Anxiety   . Bulging lumbar disc   . Congestive heart failure (CHF) ([place])   . GERD (gastroesophageal reflux disease)   . Hypertension   . Lactose intolerance   . MVP (mitral valve prolapse)   . Pyelonephritis   . Renal disorder   . Sleep apnea   . Spinal stenosis     Past Surgical History:  Procedure Laterality Date  . ankle fracture  10/09   bilateral-right was surgically repaired  . ANKLE SURGERY  10/10   screws removed  . ANKLE SURGERY  2/12   removal of all hardware right ankle  . APPENDECTOMY    . arm surgery     plate in left arm  . BREAST LUMPECTOMY     right breast  . CHOLECYSTECTOMY    . DILATION AND CURETTAGE OF UTERUS      There were no vitals filed for this visit.    Subjective Assessment - 07/13/19 0707    Subjective She reports nerve impingement Lt arm.   She reports pain in LT deltoid. Reaching overhed hurts.  Medication is beneficial but does not last more than a day. She received an injection with some benefit.  She reports no injury.    Limitations Lifting;House hold activities   reaching, hair care   Diagnostic tests Xray: negative    Patient Stated Goals She wants to  be able to reach overhead no pain.    Currently in Pain? No/denies   at rest . pain with reaching             [facility] PT Assessment - 07/13/19 0001      Assessment   Medical Diagnosis LT shoulder impingement    Referring Provider (PT) Bonnie Jakob, MD    Hand Dominance Right    Next MD Visit 3 weeks    Prior Therapy No      Precautions   Precautions None      Restrictions   Weight Bearing Restrictions No      Balance Screen   Has the patient fallen in the past 6 months No      Prior Function   Level of Independence Independent    Vocation Full time employment    Vocation Requirements No impact with work      Cognition   Overall Cognitive Status Within Functional Limits for tasks assessed      Observation/Other Assessments   Focus on Therapeutic Outcomes (FOTO)  49% with expected progress to 66 %      Posture/Postural Control  Posture Comments mild rounding of shoulders      ROM / Strength   AROM / PROM / Strength AROM;PROM;Strength      AROM   AROM Assessment Site Shoulder    Right/Left Shoulder Left    Left Shoulder Flexion 95 Degrees    Left Shoulder ABduction 95 Degrees    Left Shoulder Internal Rotation 30 Degrees    Left Shoulder External Rotation 0 Degrees    Left Shoulder Horizontal ABduction 0 Degrees    Left Shoulder Horizontal ADduction 95 Degrees      Strength   Overall Strength Comments WNL tested below shoulder height. RT/LT  some hesitancey with abduction                      Objective measurements completed on examination: See above findings.               PT Education - 07/13/19 0743    Education Details POC , HEp  FOTO reveiewed    Person(s) Educated Patient    Methods Explanation;Demonstration;Tactile cues;Verbal cues;Handout    Comprehension Verbalized understanding;Returned demonstration            PT Short Term Goals - 07/13/19 0745      PT SHORT TERM GOAL #1   Title She will be indpendent with  initial hEP    Time 3    Period Weeks    Status New      PT SHORT TERM GOAL #2   Title She will improve active Lt shoulder flexion to 120 degrees    Time 3    Period Weeks    Status New      PT SHORT TERM GOAL #3   Title She will report 25% decr pain with hair care    Time 3    Period Weeks    Status New             PT Long Term Goals - 07/13/19 0746      PT LONG TERM GOAL #1   Title She will report independence with all HEP issued    Time 6    Period Weeks    Status New      PT LONG TERM GOAL #2   Title She will report  no pain with hair care    Time 6    Period Weeks    Status New      PT LONG TERM GOAL #3   Title She will report able to mop floor with no lingering pain    Time 6    Period Weeks    Status New      PT LONG TERM GOAL #4   Title she will be able to drive with no pain    Time 6    Period Weeks    Status New      PT LONG TERM GOAL #5   Title FOTO score will improve to 66    Time 6    Period Weeks    Status New                  Plan - 07/13/19 0713    Clinical Impression Statement [name] presents with LT shoulder pain, limited motion causing pain with self care and home tasks. She should improve with skilled PT and consistent HEP.    Examination-Activity Limitations Reach Overhead;Lift   driving, pulling seat belt   Examination-Participation Restrictions Cleaning;Driving    Stability/Clinical Decision  Making Stable/Uncomplicated    Clinical Decision Making Low    Rehab Potential Good    PT Frequency 2x / week    PT Duration 6 weeks    PT Treatment/Interventions Passive range of motion;Taping;Therapeutic exercise;Therapeutic activities;Manual techniques;Electrical Stimulation;Cryotherapy;Iontophoresis 4mg /ml Dexamethasone;Moist Heat;Ultrasound;Patient/family education    PT Next Visit Plan review HEP , manual and modaloities for ROM and pain.    PT Home Exercise Plan shoulder rolls and table slide           Patient will  benefit from skilled therapeutic intervention in order to improve the following deficits and impairments:  Pain, Impaired UE functional use, Decreased activity tolerance  Visit Diagnosis: Left shoulder pain, unspecified chronicity  Stiffness of left shoulder, not elsewhere classified     Problem List Patient Active Problem List   Diagnosis Date Noted  . Gastritis 12/16/2017  . Abdominal pain 12/15/2017  . Epigastric pain 12/15/2017  . Hyperlipemia 12/15/2017  . GERD (gastroesophageal reflux disease) 12/15/2017  . CKD (chronic kidney disease), stage III 12/15/2017  . Multiple renal cysts 12/15/2017  . Angioedema 12/05/2017  . Acute respiratory failure with hypoxemia ([place])   . Essential hypertension   . Family history of breast cancer in female 04/12/2015  . Family history of ovarian cancer 04/12/2015  . Cervical radiculopathy 04/06/2014  . Carpal tunnel syndrome of left wrist 04/06/2014  . Left arm pain 10/27/2013  . Cervical disc disorder with radiculopathy of cervical region 10/27/2013  . Ischemic chest pain ([place]) 10/17/2013  . Chest pain 10/17/2013  . Airway compromise 06/28/2013  . Dizziness 06/02/2013  . Headache(784.0) 06/02/2013  . Drowsiness 06/02/2013  . Neck pain 06/02/2013  . ABNORMALITY OF GAIT 03/15/2009  . ANKLE PAIN, BILATERAL 03/14/2009  . Pain in limb 03/14/2009    Darrel Hoover 07/13/2019, 7:50 AM  [facility] [address] [place], [place], [address] Phone: [phone]   Fax:  [phone]  Name: [name] MRN: [id number] Date of Birth: October 07, 1955

## 2019-07-13 NOTE — Patient Instructions (Signed)
Shoulder rolls , table reach x 10 , 2x/day

## 2019-07-17 ENCOUNTER — Ambulatory Visit: Payer: 59

## 2019-07-17 ENCOUNTER — Other Ambulatory Visit: Payer: Self-pay

## 2019-07-17 DIAGNOSIS — M25512 Pain in left shoulder: Secondary | ICD-10-CM

## 2019-07-17 DIAGNOSIS — M25612 Stiffness of left shoulder, not elsewhere classified: Secondary | ICD-10-CM

## 2019-07-17 NOTE — Patient Instructions (Signed)
Shoulder ER/IR row/extension red band 10-20 reps 1x/day      Adduction stretch x 3-0 sec x 2-3 2x/day

## 2019-07-17 NOTE — Therapy (Signed)
[place], [place], [address] Phone: [phone]   Fax:  [phone]  Physical Therapy Treatment  Patient Details  Name: [name] MRN: [id number] Date of Birth: April 04, 1955 Referring Provider (PT): Milly Jakob, MD   Encounter Date: 07/17/2019   PT End of Session - 07/17/19 0700    Visit Number 2    Number of Visits 12    Date for PT Re-Evaluation 08/25/19    Authorization Type [facility] UMR    PT Start Time 0700    PT Stop Time 0740    PT Time Calculation (min) 40 min    Activity Tolerance Patient tolerated treatment well    Behavior During Therapy [facility] for tasks assessed/performed           Past Medical History:  Diagnosis Date  . Abnormal Pap smear of cervix   . Anemia    as a child  . Anxiety   . Bulging lumbar disc   . Congestive heart failure (CHF) ([place])   . GERD (gastroesophageal reflux disease)   . Hypertension   . Lactose intolerance   . MVP (mitral valve prolapse)   . Pyelonephritis   . Renal disorder   . Sleep apnea   . Spinal stenosis     Past Surgical History:  Procedure Laterality Date  . ankle fracture  10/09   bilateral-right was surgically repaired  . ANKLE SURGERY  10/10   screws removed  . ANKLE SURGERY  2/12   removal of all hardware right ankle  . APPENDECTOMY    . arm surgery     plate in left arm  . BREAST LUMPECTOMY     right breast  . CHOLECYSTECTOMY    . DILATION AND CURETTAGE OF UTERUS      There were no vitals filed for this visit.   Subjective Assessment - 07/17/19 0700    Subjective Plantred a garden this week end so a little sore .    Currently in Pain? Yes    Pain Score 2     Pain Location Shoulder    Pain Orientation Right    Pain Descriptors / Indicators Sore    Pain Type Acute pain    Pain Onset In the past 7 days    Pain Frequency Constant    Aggravating Factors  using arm    Pain Relieving Factors rest              [facility]  PT Assessment - 07/17/19 0001      AROM   Left Shoulder Flexion 117 Degrees                         [facility] Adult PT Treatment/Exercise - 07/17/19 0001      Exercises   Exercises Shoulder      Shoulder Exercises: Sidelying   External Rotation Left;15 reps    Theraband Level (Shoulder External Rotation) Level 2 (Red)    Internal Rotation Left;15 reps    Theraband Level (Shoulder Internal Rotation) Level 2 (Red)    ABduction Both;15 reps    ABduction Weight (lbs) 2    ABduction Limitations 45 degrees      Shoulder Exercises: Standing   Extension Both;15 reps    Theraband Level (Shoulder Extension) Level 2 (Red)    Row Both;15 reps    Theraband Level (Shoulder Row) Level 2 (Red)      Shoulder Exercises: ROM/Strengthening   UBE (  Upper Arm Bike) L1 5 min      Modalities   Modalities Cryotherapy      Cryotherapy   Number Minutes Cryotherapy 10 Minutes    Cryotherapy Location Shoulder    Type of Cryotherapy Ice pack      Manual Therapy   Manual Therapy Passive ROM;Joint mobilization    Joint Mobilization posterior and inferior glides Gr 2-3    Passive ROM all planes gentle                  PT Education - 07/17/19 0752    Education Details HEP    Person(s) Educated Patient    Methods Explanation;Demonstration;Verbal cues;Handout    Comprehension Returned demonstration;Verbalized understanding            PT Short Term Goals - 07/13/19 0745      PT SHORT TERM GOAL #1   Title She will be indpendent with initial hEP    Time 3    Period Weeks    Status New      PT SHORT TERM GOAL #2   Title She will improve active Lt shoulder flexion to 120 degrees    Time 3    Period Weeks    Status New      PT SHORT TERM GOAL #3   Title She will report 25% decr pain with hair care    Time 3    Period Weeks    Status New             PT Long Term Goals - 07/13/19 0746      PT LONG TERM GOAL #1   Title She will report independence with all HEP  issued    Time 6    Period Weeks    Status New      PT LONG TERM GOAL #2   Title She will report  no pain with hair care    Time 6    Period Weeks    Status New      PT LONG TERM GOAL #3   Title She will report able to mop floor with no lingering pain    Time 6    Period Weeks    Status New      PT LONG TERM GOAL #4   Title she will be able to drive with no pain    Time 6    Period Weeks    Status New      PT LONG TERM GOAL #5   Title FOTO score will improve to 66    Time 6    Period Weeks    Status New                 Plan - 07/17/19 0753    Clinical Impression Statement Increased ROM , tolerated load with exercise. Doing better sore from incr use but no significant pain    PT Treatment/Interventions Passive range of motion;Taping;Therapeutic exercise;Therapeutic activities;Manual techniques;Electrical Stimulation;Cryotherapy;Iontophoresis 4mg /ml Dexamethasone;Moist Heat;Ultrasound;Patient/family education    PT Next Visit Plan review HEP , manual and modaloities for ROM and pain.    PT Home Exercise Plan shoulder rolls and table slide, band row/ext/IR /ER adduction stretch    Consulted and Agree with Plan of Care Patient           Patient will benefit from skilled therapeutic intervention in order to improve the following deficits and impairments:  Pain, Impaired UE functional use, Decreased activity tolerance  Visit Diagnosis: Stiffness of left  shoulder, not elsewhere classified  Left shoulder pain, unspecified chronicity     Problem List Patient Active Problem List   Diagnosis Date Noted  . Gastritis 12/16/2017  . Abdominal pain 12/15/2017  . Epigastric pain 12/15/2017  . Hyperlipemia 12/15/2017  . GERD (gastroesophageal reflux disease) 12/15/2017  . CKD (chronic kidney disease), stage III 12/15/2017  . Multiple renal cysts 12/15/2017  . Angioedema 12/05/2017  . Acute respiratory failure with hypoxemia ([place])   . Essential hypertension   .  Family history of breast cancer in female 04/12/2015  . Family history of ovarian cancer 04/12/2015  . Cervical radiculopathy 04/06/2014  . Carpal tunnel syndrome of left wrist 04/06/2014  . Left arm pain 10/27/2013  . Cervical disc disorder with radiculopathy of cervical region 10/27/2013  . Ischemic chest pain ([place]) 10/17/2013  . Chest pain 10/17/2013  . Airway compromise 06/28/2013  . Dizziness 06/02/2013  . Headache(784.0) 06/02/2013  . Drowsiness 06/02/2013  . Neck pain 06/02/2013  . ABNORMALITY OF GAIT 03/15/2009  . ANKLE PAIN, BILATERAL 03/14/2009  . Pain in limb 03/14/2009    Darrel Hoover PT 07/17/2019, 8:00 AM  [facility] [address] [place], [place], [address] Phone: [phone]   Fax:  [phone]  Name: [name] MRN: [id number] Date of Birth: 1955/09/19

## 2019-07-17 NOTE — Progress Notes (Signed)
64 y.o. [id number] Married [place] American female here for annual exam.  Doing well.  Denies vaginal bleeding.  Did not get covid vaccinated because she had angioedema after taking a new blood pressure medication.  She ended up ventilated.  This was December 2019.  Two different physicians recommended she not be vaccinated for Covid.  Does have stage 3 renal insufficiency.  Followed now by Dr. Hollie Salk.    Denies vaginal bleeding.    Bladder does not empty completely so tries to void twice each time she goes to the bathroom.  Is getting up at night to go to the bladder at least twice.  Does not want to try anything.    PCP:  Josetta Huddle Nephrologist:  Madelon Lips  Patient's last menstrual period was 07/05/2009.          Sexually active: No.  The current method of family planning is post menopausal status.    Exercising: Yes.    stepper Smoker:  no  Health Maintenance: Pap:  03-03-16 neg, 06-22-2018 neg HPV HR neg History of abnormal Pap:  yes MMG:  04-04-19 category c density birads 1:neg Colonoscopy:  2019 f/u 59yrs per patient BMD:   2020 normal f/u 61yrs TDaP:  2012 Pneumonia vaccine(s):  Had done Shingrix:  Not done Hep C testing: neg per patient Screening Labs: poct urine-neg   reports that she has never smoked. She has never used smokeless tobacco. She reports that she does not drink alcohol and does not use drugs.  Past Medical History:  Diagnosis Date  . Abnormal Pap smear of cervix   . Anemia    as a child  . Anxiety   . Bulging lumbar disc   . Congestive heart failure (CHF) ([place])   . GERD (gastroesophageal reflux disease)   . Hypertension   . Lactose intolerance   . MVP (mitral valve prolapse)   . Pyelonephritis   . Renal disorder   . Sleep apnea   . Spinal stenosis     Past Surgical History:  Procedure Laterality Date  . ankle fracture  10/09   bilateral-right was surgically repaired  . ANKLE SURGERY  10/10   screws removed  . ANKLE SURGERY  2/12    removal of all hardware right ankle  . APPENDECTOMY    . arm surgery     plate in left arm  . BREAST LUMPECTOMY     right breast  . CHOLECYSTECTOMY    . DILATION AND CURETTAGE OF UTERUS      Current Outpatient Medications  Medication Sig Dispense Refill  . albuterol (PROVENTIL HFA;VENTOLIN HFA) 108 (90 Base) MCG/ACT inhaler Inhale 1-2 puffs into the lungs every 6 (six) hours as needed for wheezing or shortness of breath. 1 Inhaler 0  . Calcium-Magnesium-Vitamin D (CALCIUM 1200+D3) 600-40-500 MG-MG-UNIT TB24 Take 3 tablets by mouth daily.    . chlorhexidine (PERIDEX) 0.12 % solution 15 mLs 2 (two) times daily.    . cloNIDine (CATAPRES - DOSED IN MG/24 HR) 0.2 mg/24hr patch 0.2 mg once a week.    . diclofenac sodium (VOLTAREN) 1 % GEL Apply 2 g topically as needed. To affected joint  5  . diphenhydrAMINE (BENADRYL) 25 mg capsule Take 25 mg by mouth every 6 (six) hours as needed.    . diphenhydrAMINE-zinc acetate (BENADRYL EXTRA STRENGTH) cream Apply 1 application topically 3 (three) times daily as needed for itching (Apply on hands). 28.4 g 0  . esomeprazole (NEXIUM) 40 MG capsule Take 40  mg by mouth daily.     . furosemide (LASIX) 20 MG tablet     . Liniments (SALONPAS EX) Apply 1 each topically daily as needed (pain).    . meloxicam (MOBIC) 7.5 MG tablet Take 7.5 mg by mouth daily.    . metoprolol tartrate (LOPRESSOR) 50 MG tablet Take 1 tablet (50 mg total) by mouth 2 (two) times daily. (Patient taking differently: Take by mouth. Take 1 in the am & 1/2 at pm) 60 tablet 0  . ondansetron (ZOFRAN) 4 MG tablet Take 1 tablet (4 mg total) by mouth every 6 (six) hours as needed for nausea. 20 tablet 0  . spironolactone (ALDACTONE) 100 MG tablet Take 1 tablet (100 mg total) by mouth daily. 30 tablet 0  . famotidine (PEPCID) 20 MG tablet Take 1 tablet (20 mg total) by mouth 2 (two) times daily. 14 tablet 0  . nitroGLYCERIN (NITROSTAT) 0.4 MG SL tablet Place 1 tablet (0.4 mg total) under the  tongue every 5 (five) minutes as needed for chest pain. (Patient not taking: Reported on 07/18/2019) 30 tablet 12   No current facility-administered medications for this visit.    Family History  Problem Relation Age of Onset  . Diabetes Maternal Grandmother   . Stroke Maternal Grandmother   . Kidney disease Maternal Grandmother   . Ovarian cancer Paternal Grandmother        dx. late 30s-early 20s  . Prostate cancer Paternal Grandfather   . Ovarian cancer Paternal Aunt        (x3) paternal aunts dx. ages 40s-late 70s  . Breast cancer Maternal Aunt        dx. 42s  . Hypertension Mother   . Heart disease Mother   . Goiter Mother   . Other Mother        breast lumpectomy in early 93s, was in the hospital for 5 days after  . Heart disease Father   . Prostate cancer Father 40       s/p orchiectomy  . Stroke Father   . Ulcers Brother   . Other Brother        elevated PSA level w/ surveillance  . Colon polyps Brother        less than 10  . Pyelonephritis Daughter   . Breast cancer Sister 67       left breast ca - poorly differentiated carcinoma with squamous metaplasia; triple neg; has since had BL mastectomies  . Colon polyps Sister        less than 10  . Congestive Heart Failure Sister   . Prostate cancer Paternal Uncle        (x5) paternal uncles dx. prostate cancer in their 62s  . Prostate cancer Maternal Grandfather        d. late 7s with mets to colon  . Other Other 39       NOS benign brain tumor; +fluid  . Prostate cancer Cousin 63       paternal 1st cousin   . Prostate cancer Cousin        paternal 1st cousin, once-removed dx. 11s  . Prostate cancer Cousin        (x2) paternal 1st cousins, once-removed, dx. early 40s  . Lung cancer Brother 32       paternal half-brother; smoker  . Uterine cancer Maternal Aunt        dx. 30s  . Breast cancer Maternal Aunt        dx. bilateral  breast cancers - 78s, 48s  . Liver cancer Maternal Aunt        +hepatitis C; d. 42s   . Congestive Heart Failure Maternal Uncle 68  . Stroke Maternal Uncle        d. 68s; (x2 maternal uncles)  . Cancer Cousin 52       sinus cancer dx. 35s (maternal 1st cousin)  . Lung cancer Cousin        maternal 1st cousin dx. 60s/smoker; maternal 1st cousin  . Pancreatic cancer Cousin        maternal 1st cousin dx. late 64s  . Prostate cancer Cousin        maternal 1st cousin dx. 40  . Breast cancer Cousin        (x4) maternal 1st cousins dx. late 97s, 46s    Review of Systems  Constitutional: Negative.   HENT: Negative.   Eyes: Negative.   Respiratory: Negative.   Cardiovascular: Negative.   Gastrointestinal: Negative.   Endocrine: Negative.   Genitourinary:       Urinary frequency & trouble emptying bladder  Musculoskeletal: Negative.   Skin:       Itching on rt breast  Allergic/Immunologic: Negative.   Neurological: Negative.   Hematological: Negative.   Psychiatric/Behavioral: Negative.     Exam:   BP 122/80   Pulse 80   Resp 16   Ht 5' 7.75" (1.721 m)   Wt 229 lb (103.9 kg)   LMP 07/05/2009   BMI 35.08 kg/m   Height: 5' 7.75" (172.1 cm)  General appearance: alert, cooperative and appears stated age Head: Normocephalic, without obvious abnormality, atraumatic Neck: no adenopathy, supple, symmetrical, trachea midline and thyroid normal to inspection and palpation Lungs: clear to auscultation bilaterally Breasts: normal appearance, no masses or tenderness Heart: regular rate and rhythm Abdomen: soft, non-tender; bowel sounds normal; no masses,  no organomegaly Extremities: extremities normal, atraumatic, no cyanosis or edema Skin: Skin color, texture, turgor normal. No rashes or lesions Lymph nodes: Cervical, supraclavicular, and axillary nodes normal. No abnormal inguinal nodes palpated Neurologic: Grossly normal   Pelvic: External genitalia:  no lesions              Urethra:  normal appearing urethra with no masses, tenderness or lesions               Bartholins and Skenes: normal                 Vagina: normal appearing vagina with normal color and discharge, no lesions              Cervix: no lesions              Pap taken: No. Bimanual Exam:  Uterus:  normal size, contour, position, consistency, mobility, non-tender              Adnexa: normal adnexa and no mass, fullness, tenderness               Rectovaginal: Confirms               Anus:  normal sphincter tone, no lesions  Chaperone, Royal Hawthorn, CMA, was present for exam.  A:  Well Woman with normal exam PMP, no HRT H/o angioedema from medication reaction Hypertension Stage 3 CKD  P:   Mammogram guidelines reviewed.  This is up to date. pap smear with neg HR HPV 2020.  Not indicated today. Colonoscopy UTD.  Release signed today. Lab work done with  Dr. Inda Merlin and Dr. Hollie Salk BMD 2020, follow up 5 years Return annually or prn

## 2019-07-18 ENCOUNTER — Encounter: Payer: Self-pay | Admitting: Obstetrics & Gynecology

## 2019-07-18 ENCOUNTER — Ambulatory Visit (INDEPENDENT_AMBULATORY_CARE_PROVIDER_SITE_OTHER): Payer: 59 | Admitting: Obstetrics & Gynecology

## 2019-07-18 VITALS — BP 122/80 | HR 80 | Resp 16 | Ht 67.75 in | Wt 229.0 lb

## 2019-07-18 DIAGNOSIS — R350 Frequency of micturition: Secondary | ICD-10-CM

## 2019-07-18 DIAGNOSIS — Z01419 Encounter for gynecological examination (general) (routine) without abnormal findings: Secondary | ICD-10-CM

## 2019-07-18 LAB — POCT URINALYSIS DIPSTICK
Bilirubin, UA: NEGATIVE
Blood, UA: NEGATIVE
Glucose, UA: NEGATIVE
Ketones, UA: NEGATIVE
Leukocytes, UA: NEGATIVE
Nitrite, UA: NEGATIVE
Protein, UA: NEGATIVE
Urobilinogen, UA: NEGATIVE E.U./dL — AB
pH, UA: 5 (ref 5.0–8.0)

## 2019-07-20 ENCOUNTER — Ambulatory Visit: Payer: 59

## 2019-07-20 ENCOUNTER — Other Ambulatory Visit: Payer: Self-pay

## 2019-07-20 DIAGNOSIS — M25612 Stiffness of left shoulder, not elsewhere classified: Secondary | ICD-10-CM

## 2019-07-20 DIAGNOSIS — M25512 Pain in left shoulder: Secondary | ICD-10-CM

## 2019-07-20 DIAGNOSIS — G4733 Obstructive sleep apnea (adult) (pediatric): Secondary | ICD-10-CM | POA: Diagnosis not present

## 2019-07-20 NOTE — Therapy (Signed)
[place], [place], [address] Phone: [phone]   Fax:  [phone]  Physical Therapy Treatment  Patient Details  Name: [name] MRN: [id number] Date of Birth: 18-Jan-1955 Referring Provider (PT): Milly Jakob, MD   Encounter Date: 07/20/2019   PT End of Session - 07/20/19 0702    Visit Number 3    Number of Visits 12    Date for PT Re-Evaluation 08/25/19    Authorization Type [facility] UMR    PT Start Time 0700    PT Stop Time 0740    PT Time Calculation (min) 40 min    Activity Tolerance Patient tolerated treatment well    Behavior During Therapy [facility] for tasks assessed/performed           Past Medical History:  Diagnosis Date  . Abnormal Pap smear of cervix   . Anemia    as a child  . Anxiety   . Bulging lumbar disc   . Congestive heart failure (CHF) ([place])   . GERD (gastroesophageal reflux disease)   . Hypertension   . Lactose intolerance   . MVP (mitral valve prolapse)   . Pyelonephritis   . Renal disorder   . Sleep apnea   . Spinal stenosis     Past Surgical History:  Procedure Laterality Date  . ankle fracture  10/09   bilateral-right was surgically repaired  . ANKLE SURGERY  10/10   screws removed  . ANKLE SURGERY  2/12   removal of all hardware right ankle  . APPENDECTOMY    . arm surgery     plate in left arm  . BREAST LUMPECTOMY     right breast  . CHOLECYSTECTOMY    . DILATION AND CURETTAGE OF UTERUS      There were no vitals filed for this visit.   Subjective Assessment - 07/20/19 0704    Subjective Sore all over as I have been involved in wedding for today.. Need to go back today.    Currently in Pain? No/denies   in shoulder                            [facility] Adult PT Treatment/Exercise - 07/20/19 0001      Shoulder Exercises: Standing   External Rotation Left;15 reps    Internal Rotation Left;15 reps    Theraband Level (Shoulder  Internal Rotation) Level 2 (Red)    Extension Both;15 reps    Theraband Level (Shoulder Extension) Level 2 (Red)    Row Both;15 reps    Theraband Level (Shoulder Row) Level 2 (Red)    Other Standing Exercises overhead press 3# x 15 LT        Other Standing Exercises wall wash x 15      Shoulder Exercises: ROM/Strengthening   UBE (Upper Arm Bike) L2 6 min                  PT Education - 07/20/19 0739    Education Details IR/ER stretching    Person(s) Educated Patient    Methods Explanation;Demonstration;Verbal cues;Handout    Comprehension Verbalized understanding;Returned demonstration            PT Short Term Goals - 07/20/19 0741      PT SHORT TERM GOAL #1   Title She will be indpendent with initial hEP    Status Achieved  PT Long Term Goals - 07/13/19 0746      PT LONG TERM GOAL #1   Title She will report independence with all HEP issued    Time 6    Period Weeks    Status New      PT LONG TERM GOAL #2   Title She will report  no pain with hair care    Time 6    Period Weeks    Status New      PT LONG TERM GOAL #3   Title She will report able to mop floor with no lingering pain    Time 6    Period Weeks    Status New      PT LONG TERM GOAL #4   Title she will be able to drive with no pain    Time 6    Period Weeks    Status New      PT LONG TERM GOAL #5   Title FOTO score will improve to 66    Time 6    Period Weeks    Status New                 Plan - 07/20/19 1224    Clinical Impression Statement No pain in shoulder today to start but sore at end . she declined cold but I asked her to ice at home if sore 30-40 min after visit.  Improved tolerance for resistance and stretching    PT Treatment/Interventions Passive range of motion;Taping;Therapeutic exercise;Therapeutic activities;Manual techniques;Electrical Stimulation;Cryotherapy;Iontophoresis 4mg /ml Dexamethasone;Moist Heat;Ultrasound;Patient/family education     PT Next Visit Plan review HEP , manual and modaloities for ROM and pain. resistance as tolerated    PT Home Exercise Plan shoulder rolls and table slide, band row/ext/IR /ER adduction stretch  IR?ER stretching , behind back    Consulted and Agree with Plan of Care Patient           Patient will benefit from skilled therapeutic intervention in order to improve the following deficits and impairments:  Pain, Impaired UE functional use, Decreased activity tolerance  Visit Diagnosis: Stiffness of left shoulder, not elsewhere classified  Left shoulder pain, unspecified chronicity     Problem List Patient Active Problem List   Diagnosis Date Noted  . Gastritis 12/16/2017  . Abdominal pain 12/15/2017  . Epigastric pain 12/15/2017  . Hyperlipemia 12/15/2017  . GERD (gastroesophageal reflux disease) 12/15/2017  . CKD (chronic kidney disease), stage III 12/15/2017  . Multiple renal cysts 12/15/2017  . Angioedema 12/05/2017  . Acute respiratory failure with hypoxemia ([place])   . Essential hypertension   . Family history of breast cancer in female 04/12/2015  . Family history of ovarian cancer 04/12/2015  . Cervical radiculopathy 04/06/2014  . Carpal tunnel syndrome of left wrist 04/06/2014  . Left arm pain 10/27/2013  . Cervical disc disorder with radiculopathy of cervical region 10/27/2013  . Ischemic chest pain ([place]) 10/17/2013  . Chest pain 10/17/2013  . Airway compromise 06/28/2013  . Dizziness 06/02/2013  . Headache(784.0) 06/02/2013  . Drowsiness 06/02/2013  . Neck pain 06/02/2013  . ABNORMALITY OF GAIT 03/15/2009  . ANKLE PAIN, BILATERAL 03/14/2009  . Pain in limb 03/14/2009    Darrel Hoover  PT 07/20/2019, 7:42 AM  [facility] [address] [place], [place], [address] Phone: [phone]   Fax:  [phone]  Name: [name] MRN: [id number] Date of Birth: 1955/09/25

## 2019-07-20 NOTE — Patient Instructions (Signed)
Progression of IR reaching behind back 2-3 reps  2x/day 30 sec LT  ER 90/90 30 sec 2-3 reps  LT

## 2019-07-24 ENCOUNTER — Ambulatory Visit: Payer: 59

## 2019-07-24 ENCOUNTER — Other Ambulatory Visit: Payer: Self-pay

## 2019-07-24 DIAGNOSIS — M25612 Stiffness of left shoulder, not elsewhere classified: Secondary | ICD-10-CM | POA: Diagnosis not present

## 2019-07-24 DIAGNOSIS — M25512 Pain in left shoulder: Secondary | ICD-10-CM

## 2019-07-24 NOTE — Therapy (Signed)
[place], [place], [address] Phone: [phone]   Fax:  [phone]  Physical Therapy Treatment  Patient Details  Name: [name] MRN: [id number] Date of Birth: 1955/02/09 Referring Provider (PT): Milly Jakob, MD   Encounter Date: 07/24/2019   PT End of Session - 07/24/19 0712    Visit Number 4    Number of Visits 12    Date for PT Re-Evaluation 08/25/19    Authorization Type [facility] UMR    PT Start Time 0714   pt late   PT Stop Time 0745    PT Time Calculation (min) 31 min    Activity Tolerance Patient tolerated treatment well    Behavior During Therapy [facility] for tasks assessed/performed           Past Medical History:  Diagnosis Date  . Abnormal Pap smear of cervix   . Anemia    as a child  . Anxiety   . Bulging lumbar disc   . Congestive heart failure (CHF) ([place])   . GERD (gastroesophageal reflux disease)   . Hypertension   . Lactose intolerance   . MVP (mitral valve prolapse)   . Pyelonephritis   . Renal disorder   . Sleep apnea   . Spinal stenosis     Past Surgical History:  Procedure Laterality Date  . ankle fracture  10/09   bilateral-right was surgically repaired  . ANKLE SURGERY  10/10   screws removed  . ANKLE SURGERY  2/12   removal of all hardware right ankle  . APPENDECTOMY    . arm surgery     plate in left arm  . BREAST LUMPECTOMY     right breast  . CHOLECYSTECTOMY    . DILATION AND CURETTAGE OF UTERUS      There were no vitals filed for this visit.       [facility] PT Assessment - 07/24/19 0001      AROM   Left Shoulder Flexion 135 Degrees    Left Shoulder ABduction 128 Degrees    Left Shoulder Internal Rotation 65 Degrees    Left Shoulder External Rotation 78 Degrees    Left Shoulder Horizontal ADduction 110 Degrees      PROM   PROM Assessment Site Shoulder    Right/Left Shoulder Left    Left Shoulder Flexion 145 Degrees    Left Shoulder  ABduction 135 Degrees    Left Shoulder Internal Rotation 70 Degrees    Left Shoulder External Rotation 90 Degrees    Left Shoulder Horizontal ABduction 5 Degrees    Left Shoulder Horizontal ADduction 112 Degrees                         [facility] Adult PT Treatment/Exercise - 07/24/19 0001      Shoulder Exercises: Sidelying   ABduction Both;15 reps    ABduction Weight (lbs) 3    ABduction Limitations 80 degrees      Shoulder Exercises: Standing   External Rotation Left;15 reps    Theraband Level (Shoulder External Rotation) Level 4 (Blue)    Internal Rotation Left;15 reps    Theraband Level (Shoulder Internal Rotation) Level 4 (Blue)    Extension Both;15 reps    Theraband Level (Shoulder Extension) Level 4 (Blue)    Row Both;15 reps    Theraband Level (Shoulder Row) Level 4 (Blue)    Other Standing Exercises overhead press 3# x 15 LT  Shoulder Exercises: Pulleys   Flexion Limitations 50 reps with 5-10 sustained 10-20 sec      Shoulder Exercises: ROM/Strengthening   UBE (Upper Arm Bike) L2 5 min      Manual Therapy   Joint Mobilization posterior and inferior glides Gr 2-3    Passive ROM all planes gentle sustained at least 1 min.                     PT Short Term Goals - 07/24/19 0719      PT SHORT TERM GOAL #1   Title She will be indpendent with initial hEP    Status Achieved      PT SHORT TERM GOAL #2   Title She will improve active Lt shoulder flexion to 120 degrees    Baseline 135    Status Achieved      PT SHORT TERM GOAL #3   Title She will report 25% decr pain with hair care    Status Achieved             PT Long Term Goals - 07/13/19 0746      PT LONG TERM GOAL #1   Title She will report independence with all HEP issued    Time 6    Period Weeks    Status New      PT LONG TERM GOAL #2   Title She will report  no pain with hair care    Time 6    Period Weeks    Status New      PT LONG TERM GOAL #3   Title She  will report able to mop floor with no lingering pain    Time 6    Period Weeks    Status New      PT LONG TERM GOAL #4   Title she will be able to drive with no pain    Time 6    Period Weeks    Status New      PT LONG TERM GOAL #5   Title FOTO score will improve to 66    Time 6    Period Weeks    Status New                 Plan - 07/24/19 0713    Clinical Impression Statement All STG met. Progressing toeard LTG. Tolerates incr weight and stretching without increased pain.  Wil increase weight and contiue ROM. she reports some clicking and advised to stop if has pain.    PT Treatment/Interventions Passive range of motion;Taping;Therapeutic exercise;Therapeutic activities;Manual techniques;Electrical Stimulation;Cryotherapy;Iontophoresis 4mg/ml Dexamethasone;Moist Heat;Ultrasound;Patient/family education    PT Next Visit Plan review HEP , manual and modaloities for ROM and pain. resistance as tolerated    PT Home Exercise Plan shoulder rolls and table slide, band row/ext/IR /ER adduction stretch  IR?ER stretching , behind back    Consulted and Agree with Plan of Care Patient           Patient will benefit from skilled therapeutic intervention in order to improve the following deficits and impairments:  Pain, Impaired UE functional use, Decreased activity tolerance  Visit Diagnosis: Left shoulder pain, unspecified chronicity  Stiffness of left shoulder, not elsewhere classified     Problem List Patient Active Problem List   Diagnosis Date Noted  . Gastritis 12/16/2017  . Abdominal pain 12/15/2017  . Epigastric pain 12/15/2017  . Hyperlipemia 12/15/2017  . GERD (gastroesophageal reflux disease) 12/15/2017  . CKD (chronic   kidney disease), stage III 12/15/2017  . Multiple renal cysts 12/15/2017  . Angioedema 12/05/2017  . Acute respiratory failure with hypoxemia ([facility])   . Essential hypertension   . Family history of breast cancer in female 04/12/2015  . Family  history of ovarian cancer 04/12/2015  . Cervical radiculopathy 04/06/2014  . Carpal tunnel syndrome of left wrist 04/06/2014  . Left arm pain 10/27/2013  . Cervical disc disorder with radiculopathy of cervical region 10/27/2013  . Ischemic chest pain ([facility]) 10/17/2013  . Chest pain 10/17/2013  . Airway compromise 06/28/2013  . Dizziness 06/02/2013  . Headache(784.0) 06/02/2013  . Drowsiness 06/02/2013  . Neck pain 06/02/2013  . ABNORMALITY OF GAIT 03/15/2009  . ANKLE PAIN, BILATERAL 03/14/2009  . Pain in limb 03/14/2009    Chasse, Stephen M  PT 07/24/2019, 7:55 AM  [facility] [address] [place], [place], [address] Phone: [phone]   Fax:  [phone]  Name: Abeni H Marty MRN: [id number] Date of Birth: 12/28/1955   

## 2019-07-26 ENCOUNTER — Other Ambulatory Visit: Payer: Self-pay

## 2019-07-26 ENCOUNTER — Ambulatory Visit: Payer: 59 | Admitting: Physical Therapy

## 2019-07-26 DIAGNOSIS — M25612 Stiffness of left shoulder, not elsewhere classified: Secondary | ICD-10-CM

## 2019-07-26 DIAGNOSIS — M25512 Pain in left shoulder: Secondary | ICD-10-CM

## 2019-07-26 NOTE — Therapy (Signed)
[place], [place], [address] Phone: [phone]   Fax:  [phone]  Physical Therapy Treatment  Patient Details  Name: [name] MRN: [id number] Date of Birth: 1955-11-06 Referring Provider (PT): Milly Jakob, MD   Encounter Date: 07/26/2019   PT End of Session - 07/26/19 0724    Visit Number 5    Number of Visits 12    Date for PT Re-Evaluation 08/25/19    Authorization Type [facility] U[facility]    PT Start Time 0720    PT Stop Time 0758    PT Time Calculation (min) 38 min           Past Medical History:  Diagnosis Date  . Abnormal Pap smear of cervix   . Anemia    as a child  . Anxiety   . Bulging lumbar disc   . Congestive heart failure (CHF) ([place])   . GERD (gastroesophageal reflux disease)   . Hypertension   . Lactose intolerance   . MVP (mitral valve prolapse)   . Pyelonephritis   . Renal disorder   . Sleep apnea   . Spinal stenosis     Past Surgical History:  Procedure Laterality Date  . ankle fracture  10/09   bilateral-right was surgically repaired  . ANKLE SURGERY  10/10   screws removed  . ANKLE SURGERY  2/12   removal of all hardware right ankle  . APPENDECTOMY    . arm surgery     plate in left arm  . BREAST LUMPECTOMY     right breast  . CHOLECYSTECTOMY    . DILATION AND CURETTAGE OF UTERUS      There were no vitals filed for this visit.       [facility] PT Assessment - 07/26/19 0001      PROM   Left Shoulder Flexion 150 Degrees    Left Shoulder ABduction 135 Degrees    Left Shoulder Internal Rotation 75 Degrees    Left Shoulder External Rotation 90 Degrees                         [facility] Adult PT Treatment/Exercise - 07/26/19 0001      Shoulder Exercises: Standing   External Rotation Left;20 reps    Theraband Level (Shoulder External Rotation) Level 4 (Blue)    Internal Rotation Left;20 reps    Theraband Level (Shoulder Internal Rotation)  Level 4 (Blue)    Extension 20 reps    Theraband Level (Shoulder Extension) Level 4 (Blue)    Row 20 reps    Theraband Level (Shoulder Row) Level 4 (Blue)    Other Standing Exercises curl to overhead press 3# x 20 LT        Other Standing Exercises scaption 2# x 10 -pain, wall washing 1 minute , scaption wall slides x 10       Shoulder Exercises: Pulleys   Flexion Limitations 50 reps with 5-10 sustained 10-20 sec      Shoulder Exercises: ROM/Strengthening   UBE (Upper Arm Bike) L2 6 min half time each way   cues for erect posture      Shoulder Exercises: Stretch   Corner Stretch Limitations doorway 10 sec x 10     Internal Rotation Stretch Limitations towel roll under axilla       Manual Therapy   Joint Mobilization posterior and inferior glides Gr 2-3    Passive ROM all planes gentle  sustained at least 1 min.                     PT Short Term Goals - 07/24/19 0719      PT SHORT TERM GOAL #1   Title She will be indpendent with initial hEP    Status Achieved      PT SHORT TERM GOAL #2   Title She will improve active Lt shoulder flexion to 120 degrees    Baseline 135    Status Achieved      PT SHORT TERM GOAL #3   Title She will report 25% decr pain with hair care    Status Achieved             PT Long Term Goals - 07/13/19 0746      PT LONG TERM GOAL #1   Title She will report independence with all HEP issued    Time 6    Period Weeks    Status New      PT LONG TERM GOAL #2   Title She will report  no pain with hair care    Time 6    Period Weeks    Status New      PT LONG TERM GOAL #3   Title She will report able to mop floor with no lingering pain    Time 6    Period Weeks    Status New      PT LONG TERM GOAL #4   Title she will be able to drive with no pain    Time 6    Period Weeks    Status New      PT LONG TERM GOAL #5   Title FOTO score will improve to 66    Time 6    Period Weeks    Status New                 Plan -  07/26/19 0759    Clinical Impression Statement Pt arrives reporting pain mostly with lying on left side, otherwise no pain on arrival. Continued with strength and ROM, session tolerated well. PROM improving.    PT Next Visit Plan review HEP , manual and modaloities for ROM and pain. resistance as tolerated    PT Home Exercise Plan shoulder rolls and table slide, band row/ext/IR /ER adduction stretch  IR?ER stretching , behind back           Patient will benefit from skilled therapeutic intervention in order to improve the following deficits and impairments:  Pain, Impaired UE functional use, Decreased activity tolerance  Visit Diagnosis: Stiffness of left shoulder, not elsewhere classified  Left shoulder pain, unspecified chronicity     Problem List Patient Active Problem List   Diagnosis Date Noted  . Gastritis 12/16/2017  . Abdominal pain 12/15/2017  . Epigastric pain 12/15/2017  . Hyperlipemia 12/15/2017  . GERD (gastroesophageal reflux disease) 12/15/2017  . CKD (chronic kidney disease), stage III 12/15/2017  . Multiple renal cysts 12/15/2017  . Angioedema 12/05/2017  . Acute respiratory failure with hypoxemia ([place])   . Essential hypertension   . Family history of breast cancer in female 04/12/2015  . Family history of ovarian cancer 04/12/2015  . Cervical radiculopathy 04/06/2014  . Carpal tunnel syndrome of left wrist 04/06/2014  . Left arm pain 10/27/2013  . Cervical disc disorder with radiculopathy of cervical region 10/27/2013  . Ischemic chest pain ([place]) 10/17/2013  . Chest pain 10/17/2013  .  Airway compromise 06/28/2013  . Dizziness 06/02/2013  . Headache(784.0) 06/02/2013  . Drowsiness 06/02/2013  . Neck pain 06/02/2013  . ABNORMALITY OF GAIT 03/15/2009  . ANKLE PAIN, BILATERAL 03/14/2009  . Pain in limb 03/14/2009    Dorene Ar, [place] 07/26/2019, 8:13 AM  [facility] [address] [place], [place], [address] Phone: [phone]   Fax:  [phone]  Name: [name] MRN: [id number] Date of Birth: [birth year]

## 2019-07-31 ENCOUNTER — Ambulatory Visit: Payer: 59

## 2019-07-31 ENCOUNTER — Other Ambulatory Visit: Payer: Self-pay

## 2019-07-31 DIAGNOSIS — M25612 Stiffness of left shoulder, not elsewhere classified: Secondary | ICD-10-CM

## 2019-07-31 DIAGNOSIS — M25512 Pain in left shoulder: Secondary | ICD-10-CM

## 2019-07-31 NOTE — Therapy (Signed)
[place], [place], [address] Phone: [phone]   Fax:  [phone]  Physical Therapy Treatment  Patient Details  Name: [name] MRN: [id number] Date of Birth: 1955/07/16 Referring Provider (PT): Milly Jakob, MD   Encounter Date: 07/31/2019   PT End of Session - 07/31/19 0702    Visit Number 6    Number of Visits 12    Date for PT Re-Evaluation 08/25/19    Authorization Type [facility] U[facility]    PT Start Time 0700    PT Stop Time 0740    PT Time Calculation (min) 40 min    Activity Tolerance Patient tolerated treatment well    Behavior During Therapy [facility] for tasks assessed/performed           Past Medical History:  Diagnosis Date  . Abnormal Pap smear of cervix   . Anemia    as a child  . Anxiety   . Bulging lumbar disc   . Congestive heart failure (CHF) ([place])   . GERD (gastroesophageal reflux disease)   . Hypertension   . Lactose intolerance   . MVP (mitral valve prolapse)   . Pyelonephritis   . Renal disorder   . Sleep apnea   . Spinal stenosis     Past Surgical History:  Procedure Laterality Date  . ankle fracture  10/09   bilateral-right was surgically repaired  . ANKLE SURGERY  10/10   screws removed  . ANKLE SURGERY  2/12   removal of all hardware right ankle  . APPENDECTOMY    . arm surgery     plate in left arm  . BREAST LUMPECTOMY     right breast  . CHOLECYSTECTOMY    . DILATION AND CURETTAGE OF UTERUS      There were no vitals filed for this visit.   Subjective Assessment - 07/31/19 0706    Subjective No pain today . STill some lying on LT side or working using arm.    Currently in Pain? No/denies              [facility] PT Assessment - 07/31/19 0001      AROM   Left Shoulder Flexion 135 Degrees      PROM   Left Shoulder Flexion 150 Degrees                         [facility] Adult PT Treatment/Exercise - 07/31/19 0001      Shoulder  Exercises: Seated   Other Seated Exercises over head with 3# x 15      Shoulder Exercises: Standing   External Rotation Left;20 reps    Theraband Level (Shoulder External Rotation) Level 4 (Blue)    Internal Rotation Left;20 reps    Theraband Level (Shoulder Internal Rotation) Level 4 (Blue)    Extension 20 reps    Theraband Level (Shoulder Extension) Level 4 (Blue)    Row 20 reps    Theraband Level (Shoulder Row) Level 4 (Blue)    Other Standing Exercises wall washing x 1 min      Shoulder Exercises: ROM/Strengthening   UBE (Upper Arm Bike) L2.9   6 min half time each way      Manual Therapy   Joint Mobilization posterior and inferior glides Gr 2-3    Passive ROM flexion and ER    and IR . Pain with ER stretch /. Did some deep breathng  x 5 and  able to move ER with less pain and INcr ROM                   PT Education - 07/31/19 0746    Education Details scaption isometric no pain,  posisble pain tissue with shoulder abduction and why starting isometrics possibey helpful.   use of deep breathing  for shouder ORM and    Person(s) Educated Patient    Methods Explanation;Demonstration;Verbal cues;Handout;Tactile cues    Comprehension Verbalized understanding;Returned demonstration            PT Short Term Goals - 07/24/19 0719      PT SHORT TERM GOAL #1   Title She will be indpendent with initial hEP    Status Achieved      PT SHORT TERM GOAL #2   Title She will improve active Lt shoulder flexion to 120 degrees    Baseline 135    Status Achieved      PT SHORT TERM GOAL #3   Title She will report 25% decr pain with hair care    Status Achieved             PT Long Term Goals - 07/13/19 0746      PT LONG TERM GOAL #1   Title She will report independence with all HEP issued    Time 6    Period Weeks    Status New      PT LONG TERM GOAL #2   Title She will report  no pain with hair care    Time 6    Period Weeks    Status New      PT LONG TERM GOAL  #3   Title She will report able to mop floor with no lingering pain    Time 6    Period Weeks    Status New      PT LONG TERM GOAL #4   Title she will be able to drive with no pain    Time 6    Period Weeks    Status New      PT LONG TERM GOAL #5   Title FOTO score will improve to 66    Time 6    Period Weeks    Status New                 Plan - 07/31/19 1245    Clinical Impression Statement ROM remains the same . more tneder with ER. isometrics to eae tension pon tissue  and try to build tolerance.   continue strength and ROm    PT Treatment/Interventions Passive range of motion;Taping;Therapeutic exercise;Therapeutic activities;Manual techniques;Electrical Stimulation;Cryotherapy;Iontophoresis 4mg /ml Dexamethasone;Moist Heat;Ultrasound;Patient/family education    PT Next Visit Plan review isometrics. , manual and modaloities for ROM and pain. resistance as tolerated    PT Home Exercise Plan shoulder rolls and table slide, band row/ext/IR /ER adduction stretch  IR?ER stretching , behind back  scaption isometrics    Consulted and Agree with Plan of Care Patient           Patient will benefit from skilled therapeutic intervention in order to improve the following deficits and impairments:  Pain, Impaired UE functional use, Decreased activity tolerance  Visit Diagnosis: Stiffness of left shoulder, not elsewhere classified  Left shoulder pain, unspecified chronicity     Problem List Patient Active Problem List   Diagnosis Date Noted  . Gastritis 12/16/2017  . Abdominal pain 12/15/2017  . Epigastric pain 12/15/2017  .  Hyperlipemia 12/15/2017  . GERD (gastroesophageal reflux disease) 12/15/2017  . CKD (chronic kidney disease), stage III 12/15/2017  . Multiple renal cysts 12/15/2017  . Angioedema 12/05/2017  . Acute respiratory failure with hypoxemia ([place])   . Essential hypertension   . Family history of breast cancer in female 04/12/2015  . Family history of  ovarian cancer 04/12/2015  . Cervical radiculopathy 04/06/2014  . Carpal tunnel syndrome of left wrist 04/06/2014  . Left arm pain 10/27/2013  . Cervical disc disorder with radiculopathy of cervical region 10/27/2013  . Ischemic chest pain ([place]) 10/17/2013  . Chest pain 10/17/2013  . Airway compromise 06/28/2013  . Dizziness 06/02/2013  . Headache(784.0) 06/02/2013  . Drowsiness 06/02/2013  . Neck pain 06/02/2013  . ABNORMALITY OF GAIT 03/15/2009  . ANKLE PAIN, BILATERAL 03/14/2009  . Pain in limb 03/14/2009    Darrel Hoover  PT 07/31/2019, 7:49 AM  [facility] [address] , [place], [address] Phone: [phone]   Fax:  [phone]  Name: [name] MRN: [id number] Date of Birth: 02-21-1955

## 2019-07-31 NOTE — Patient Instructions (Signed)
LT shoulder isometrics scaption x 10 5 sec hold 1-2x/day

## 2019-08-02 ENCOUNTER — Encounter: Payer: Self-pay | Admitting: Physical Therapy

## 2019-08-02 ENCOUNTER — Ambulatory Visit: Payer: 59 | Admitting: Physical Therapy

## 2019-08-02 ENCOUNTER — Other Ambulatory Visit: Payer: Self-pay

## 2019-08-02 DIAGNOSIS — M25612 Stiffness of left shoulder, not elsewhere classified: Secondary | ICD-10-CM

## 2019-08-02 DIAGNOSIS — M25512 Pain in left shoulder: Secondary | ICD-10-CM

## 2019-08-02 NOTE — Therapy (Signed)
[place], [place], [address] Phone: [phone]   Fax:  [phone]  Physical Therapy Treatment  Patient Details  Name: [name] MRN: [id number] Date of Birth: January 21, 1955 Referring Provider (PT): Milly Jakob, MD   Encounter Date: 08/02/2019   PT End of Session - 08/02/19 0718    Visit Number 7    Number of Visits 12    Date for PT Re-Evaluation 08/25/19    Authorization Type [facility] UMR    PT Start Time 0715    PT Stop Time 0805    PT Time Calculation (min) 50 min           Past Medical History:  Diagnosis Date  . Abnormal Pap smear of cervix   . Anemia    as a child  . Anxiety   . Bulging lumbar disc   . Congestive heart failure (CHF) ([place])   . GERD (gastroesophageal reflux disease)   . Hypertension   . Lactose intolerance   . MVP (mitral valve prolapse)   . Pyelonephritis   . Renal disorder   . Sleep apnea   . Spinal stenosis     Past Surgical History:  Procedure Laterality Date  . ankle fracture  10/09   bilateral-right was surgically repaired  . ANKLE SURGERY  10/10   screws removed  . ANKLE SURGERY  2/12   removal of all hardware right ankle  . APPENDECTOMY    . arm surgery     plate in left arm  . BREAST LUMPECTOMY     right breast  . CHOLECYSTECTOMY    . DILATION AND CURETTAGE OF UTERUS      There were no vitals filed for this visit.   Subjective Assessment - 08/02/19 0717    Subjective Shoulder is still sore but my ROM is better.    Currently in Pain? Yes    Pain Score 3     Pain Location Shoulder    Pain Orientation Right    Pain Descriptors / Indicators Sore    Aggravating Factors  end range reaching, using arm    Pain Relieving Factors rest              [facility] PT Assessment - 08/02/19 0001      PROM   Left Shoulder Flexion 150 Degrees    Left Shoulder ABduction 145 Degrees    Left Shoulder Internal Rotation 80 Degrees    Left Shoulder  External Rotation 90 Degrees                         [facility] Adult PT Treatment/Exercise - 08/02/19 0001      Shoulder Exercises: Supine   Protraction 20 reps    Protraction Weight (lbs) 2    Other Supine Exercises 2# punch x 10 x 2 , 2# shoulder flexion-decreased ROM for comfort x 20      Shoulder Exercises: Sidelying   External Rotation 10 reps   2 sets   External Rotation Weight (lbs) 2    ABduction AROM;10 reps   2 sets     Shoulder Exercises: Standing   Horizontal ABduction 20 reps    Theraband Level (Shoulder Horizontal ABduction) Level 2 (Red)    External Rotation Left;20 reps    Theraband Level (Shoulder External Rotation) Level 4 (Blue)    Internal Rotation Left;20 reps    Theraband Level (Shoulder Internal Rotation) Level 4 (Blue)  Extension 20 reps    Theraband Level (Shoulder Extension) Level 4 (Blue)    Row 20 reps    Theraband Level (Shoulder Row) Level 4 (Blue)    Other Standing Exercises cabinet reaching 2# x 10, 3# x 10, wall push up x 10     Other Standing Exercises wall washing x 1 min , scaption 2# x 10 , forward raise 2# x 10       Shoulder Exercises: ROM/Strengthening   UBE (Upper Arm Bike) L3 6 minutes       Shoulder Exercises: Stretch   Corner Stretch Limitations doorway 10 sec x 10       Manual Therapy   Joint Mobilization posterior and inferior glides Gr 2-3    Passive ROM flexion and ER    and IR . Pain with ER stretch /. Did some deep breathng  x 5 and able to move ER with less pain and INcr ROM                     PT Short Term Goals - 07/24/19 0719      PT SHORT TERM GOAL #1   Title She will be indpendent with initial hEP    Status Achieved      PT SHORT TERM GOAL #2   Title She will improve active Lt shoulder flexion to 120 degrees    Baseline 135    Status Achieved      PT SHORT TERM GOAL #3   Title She will report 25% decr pain with hair care    Status Achieved             PT Long Term Goals -  07/13/19 0746      PT LONG TERM GOAL #1   Title She will report independence with all HEP issued    Time 6    Period Weeks    Status New      PT LONG TERM GOAL #2   Title She will report  no pain with hair care    Time 6    Period Weeks    Status New      PT LONG TERM GOAL #3   Title She will report able to mop floor with no lingering pain    Time 6    Period Weeks    Status New      PT LONG TERM GOAL #4   Title she will be able to drive with no pain    Time 6    Period Weeks    Status New      PT LONG TERM GOAL #5   Title FOTO score will improve to 66    Time 6    Period Weeks    Status New                 Plan - 08/02/19 4580    Clinical Impression Statement Pt reports she notes more ROM and is able to fasten her seatbelt with her Right arm. She is also noticing improved reaching to comb her hair. Continued with ROM and increased strengthening focus today.    PT Treatment/Interventions Passive range of motion;Taping;Therapeutic exercise;Therapeutic activities;Manual techniques;Electrical Stimulation;Cryotherapy;Iontophoresis 4mg /ml Dexamethasone;Moist Heat;Ultrasound;Patient/family education    PT Next Visit Plan manual and modaloities for ROM and pain. resistance as tolerated    PT Home Exercise Plan shoulder rolls and table slide, band row/ext/IR /ER adduction stretch  IR/ER stretching , behind back  scaption isometrics  Consulted and Agree with Plan of Care Patient           Patient will benefit from skilled therapeutic intervention in order to improve the following deficits and impairments:  Pain, Impaired UE functional use, Decreased activity tolerance  Visit Diagnosis: Stiffness of left shoulder, not elsewhere classified  Left shoulder pain, unspecified chronicity     Problem List Patient Active Problem List   Diagnosis Date Noted  . Gastritis 12/16/2017  . Abdominal pain 12/15/2017  . Epigastric pain 12/15/2017  . Hyperlipemia 12/15/2017    . GERD (gastroesophageal reflux disease) 12/15/2017  . CKD (chronic kidney disease), stage III 12/15/2017  . Multiple renal cysts 12/15/2017  . Angioedema 12/05/2017  . Acute respiratory failure with hypoxemia ([place])   . Essential hypertension   . Family history of breast cancer in female 04/12/2015  . Family history of ovarian cancer 04/12/2015  . Cervical radiculopathy 04/06/2014  . Carpal tunnel syndrome of left wrist 04/06/2014  . Left arm pain 10/27/2013  . Cervical disc disorder with radiculopathy of cervical region 10/27/2013  . Ischemic chest pain ([place]) 10/17/2013  . Chest pain 10/17/2013  . Airway compromise 06/28/2013  . Dizziness 06/02/2013  . Headache(784.0) 06/02/2013  . Drowsiness 06/02/2013  . Neck pain 06/02/2013  . ABNORMALITY OF GAIT 03/15/2009  . ANKLE PAIN, BILATERAL 03/14/2009  . Pain in limb 03/14/2009    Dorene Ar, [place] 08/02/2019, 8:18 AM  [facility] [address] [place], [place], [address] Phone: [phone]   Fax:  [phone]  Name: ZEINAB RODWELL MRN: [id number] Date of Birth: 1955-05-05

## 2019-08-04 DIAGNOSIS — Z20822 Contact with and (suspected) exposure to covid-19: Secondary | ICD-10-CM | POA: Diagnosis not present

## 2019-08-07 ENCOUNTER — Ambulatory Visit: Payer: 59 | Attending: Orthopedic Surgery

## 2019-08-07 ENCOUNTER — Other Ambulatory Visit: Payer: Self-pay

## 2019-08-07 DIAGNOSIS — M25612 Stiffness of left shoulder, not elsewhere classified: Secondary | ICD-10-CM | POA: Insufficient documentation

## 2019-08-07 DIAGNOSIS — M25512 Pain in left shoulder: Secondary | ICD-10-CM | POA: Diagnosis not present

## 2019-08-07 NOTE — Therapy (Deleted)
[place], [place], [address] Phone: [phone]   Fax:  [phone]  Patient Details  Name: [name] MRN: [id number] Date of Birth: 30-Aug-1955 Referring Provider:  Milly Jakob, MD  Encounter Date: 08/07/2019   Darrel Hoover 08/07/2019, 8:47 AM  [facility] [address] [place], [place], [address] Phone: [phone]   Fax:  [phone]

## 2019-08-07 NOTE — Therapy (Signed)
[place], [place], [address] Phone: [phone]   Fax:  [phone]  Physical Therapy Treatment  Patient Details  Name: [name] MRN: [id number] Date of Birth: [birth year] Referring Provider (PT): Milly Jakob, MD   Encounter Date: 08/07/2019   PT End of Session - 08/07/19 0700    Visit Number 8    Number of Visits 12    Date for PT Re-Evaluation 08/25/19    Authorization Type [facility] UMR    PT Start Time 0700    PT Stop Time 0740    PT Time Calculation (min) 40 min    Activity Tolerance Patient tolerated treatment well    Behavior During Therapy [facility] for tasks assessed/performed           Past Medical History:  Diagnosis Date  . Abnormal Pap smear of cervix   . Anemia    as a child  . Anxiety   . Bulging lumbar disc   . Congestive heart failure (CHF) ([place])   . GERD (gastroesophageal reflux disease)   . Hypertension   . Lactose intolerance   . MVP (mitral valve prolapse)   . Pyelonephritis   . Renal disorder   . Sleep apnea   . Spinal stenosis     Past Surgical History:  Procedure Laterality Date  . ankle fracture  10/09   bilateral-right was surgically repaired  . ANKLE SURGERY  10/10   screws removed  . ANKLE SURGERY  2/12   removal of all hardware right ankle  . APPENDECTOMY    . arm surgery     plate in left arm  . BREAST LUMPECTOMY     right breast  . CHOLECYSTECTOMY    . DILATION AND CURETTAGE OF UTERUS      There were no vitals filed for this visit.   Subjective Assessment - 08/07/19 0703    Subjective No pain today . doing better but still gets sore wiht use and lying on shoulder    Currently in Pain? No/denies                             [facility] Adult PT Treatment/Exercise - 08/07/19 0001      Shoulder Exercises: Supine   Protraction Weight (lbs) 5    Protraction Limitations x30 chest press    Horizontal ABduction Both    Theraband Level  (Shoulder Horizontal ABduction) Level 3 (Green)    Horizontal ABduction Limitations 100 reps      Shoulder Exercises: Sidelying   External Rotation Left;20 reps    External Rotation Weight (lbs) 3    ABduction Left;20 reps    ABduction Weight (lbs) 3      Shoulder Exercises: Standing   Extension 20 reps    Theraband Level (Shoulder Extension) Level 4 (Blue)    Row 20 reps    Theraband Level (Shoulder Row) Level 4 (Blue)    Other Standing Exercises cabinet reaching , 3# x 20, wall push up x 20     Other Standing Exercises Overhead  press and bicep cuirls 5# x2 x 10      Shoulder Exercises: ROM/Strengthening   UBE (Upper Arm Bike) L3 6 minutes 3 forward , 3 back      Shoulder Exercises: Stretch   Corner Stretch Limitations doorway 10 sec x 10       Manual Therapy   Joint Mobilization posterior and inferior glides  Gr 3    Passive ROM IR/ER  ROM now WNL                    PT Short Term Goals - 07/24/19 0719      PT SHORT TERM GOAL #1   Title She will be indpendent with initial hEP    Status Achieved      PT SHORT TERM GOAL #2   Title She will improve active Lt shoulder flexion to 120 degrees    Baseline 135    Status Achieved      PT SHORT TERM GOAL #3   Title She will report 25% decr pain with hair care    Status Achieved             PT Long Term Goals - 08/07/19 0846      PT LONG TERM GOAL #1   Title She will report independence with all HEP issued    Status On-going      PT LONG TERM GOAL #2   Title She will report  no pain with hair care    Status Achieved      PT LONG TERM GOAL #3   Title She will report able to mop floor with no lingering pain    Status Partially Met      PT LONG TERM GOAL #4   Title she will be able to drive with no pain    Status Achieved                 Plan - 08/07/19 0701    Clinical Impression Statement Much improved with heavier weight without pain, rotation ROM WNL but end range ER past 90 degrees.    Progressing well and should in long run be painfree.    PT Treatment/Interventions Passive range of motion;Taping;Therapeutic exercise;Therapeutic activities;Manual techniques;Electrical Stimulation;Cryotherapy;Iontophoresis 43m/ml Dexamethasone;Moist Heat;Ultrasound;Patient/family education    PT Next Visit Plan manual and modaloities for ROM and pain. resistance as tolerated              FOTO    PT Home Exercise Plan shoulder rolls and table slide, band row/ext/IR /ER adduction stretch  IR/ER stretching , behind back  scaption isometrics    Consulted and Agree with Plan of Care Patient           Patient will benefit from skilled therapeutic intervention in order to improve the following deficits and impairments:  Pain, Impaired UE functional use, Decreased activity tolerance  Visit Diagnosis: Stiffness of left shoulder, not elsewhere classified  Left shoulder pain, unspecified chronicity     Problem List Patient Active Problem List   Diagnosis Date Noted  . Gastritis 12/16/2017  . Abdominal pain 12/15/2017  . Epigastric pain 12/15/2017  . Hyperlipemia 12/15/2017  . GERD (gastroesophageal reflux disease) 12/15/2017  . CKD (chronic kidney disease), stage III 12/15/2017  . Multiple renal cysts 12/15/2017  . Angioedema 12/05/2017  . Acute respiratory failure with hypoxemia ([place]   . Essential hypertension   . Family history of breast cancer in female 04/12/2015  . Family history of ovarian cancer 04/12/2015  . Cervical radiculopathy 04/06/2014  . Carpal tunnel syndrome of left wrist 04/06/2014  . Left arm pain 10/27/2013  . Cervical disc disorder with radiculopathy of cervical region 10/27/2013  . Ischemic chest pain ([facility] 10/17/2013  . Chest pain 10/17/2013  . Airway compromise 06/28/2013  . Dizziness 06/02/2013  . Headache(784.0) 06/02/2013  . Drowsiness 06/02/2013  . Neck pain 06/02/2013  .  ABNORMALITY OF GAIT 03/15/2009  . ANKLE PAIN, BILATERAL 03/14/2009  . Pain in  limb 03/14/2009    Darrel Hoover  PT 08/07/2019, 8:47 AM  [facility] [address] [place], [place], [address] Phone: [phone]   Fax:  [phone]  Name: [name] MRN: [id number] Date of Birth: 12/18/1955

## 2019-08-08 DIAGNOSIS — M25512 Pain in left shoulder: Secondary | ICD-10-CM | POA: Diagnosis not present

## 2019-08-10 ENCOUNTER — Other Ambulatory Visit: Payer: Self-pay

## 2019-08-10 ENCOUNTER — Ambulatory Visit: Payer: 59

## 2019-08-10 DIAGNOSIS — M25512 Pain in left shoulder: Secondary | ICD-10-CM | POA: Diagnosis not present

## 2019-08-10 DIAGNOSIS — M25612 Stiffness of left shoulder, not elsewhere classified: Secondary | ICD-10-CM | POA: Diagnosis not present

## 2019-08-10 NOTE — Therapy (Signed)
[place], [place], [address] Phone: [phone]   Fax:  [phone]  Physical Therapy Treatment/Discharge  Patient Details  Name: [name] MRN: [id number] Date of Birth: [birth year] Referring Provider (PT): Milly Jakob, MD   Encounter Date: 08/10/2019   PT End of Session - 08/10/19 0700    Visit Number 9    Number of Visits 12    Date for PT Re-Evaluation 08/25/19    Authorization Type [facility] UMR    PT Start Time 0700    PT Stop Time 0740    PT Time Calculation (min) 40 min    Activity Tolerance Patient tolerated treatment well           Past Medical History:  Diagnosis Date  . Abnormal Pap smear of cervix   . Anemia    as a child  . Anxiety   . Bulging lumbar disc   . Congestive heart failure (CHF) ([place])   . GERD (gastroesophageal reflux disease)   . Hypertension   . Lactose intolerance   . MVP (mitral valve prolapse)   . Pyelonephritis   . Renal disorder   . Sleep apnea   . Spinal stenosis     Past Surgical History:  Procedure Laterality Date  . ankle fracture  10/09   bilateral-right was surgically repaired  . ANKLE SURGERY  10/10   screws removed  . ANKLE SURGERY  2/12   removal of all hardware right ankle  . APPENDECTOMY    . arm surgery     plate in left arm  . BREAST LUMPECTOMY     right breast  . CHOLECYSTECTOMY    . DILATION AND CURETTAGE OF UTERUS      There were no vitals filed for this visit.       [facility] PT Assessment - 08/10/19 0001      Observation/Other Assessments   Focus on Therapeutic Outcomes (FOTO)  77%                         [facility] Adult PT Treatment/Exercise - 08/10/19 0001      Shoulder Exercises: Supine   Protraction Weight (lbs) 6    Protraction Limitations x30 chest press    Horizontal ABduction Both    Horizontal ABduction Weight (lbs) 3    Horizontal ABduction Limitations 50      Shoulder Exercises: Standing    Horizontal ABduction 20 reps    Theraband Level (Shoulder Horizontal ABduction) Level 3 (Green)    External Rotation Left;20 reps    Theraband Level (Shoulder External Rotation) Level 4 (Blue)    Internal Rotation Left;20 reps    Theraband Level (Shoulder Internal Rotation) Level 4 (Blue)    Extension 20 reps    Theraband Level (Shoulder Extension) Level 4 (Blue)    Row 20 reps    Theraband Level (Shoulder Row) Level 4 (Blue)    Other Standing Exercises Carry 10-20-23 pounds without pain.     Other Standing Exercises Overhead  press and bicep cuirls 5# x2 x 10      Shoulder Exercises: ROM/Strengthening   UBE (Upper Arm Bike) L3 6 minutes 3 forward , 3 back      Shoulder Exercises: Stretch   Corner Stretch Limitations doorway 10 sec x 10     Wall Stretch - Flexion Limitations into corner for flex and abduction.  30 sec x 2  PT Short Term Goals - 07/24/19 0719      PT SHORT TERM GOAL #1   Title She will be indpendent with initial hEP    Status Achieved      PT SHORT TERM GOAL #2   Title She will improve active Lt shoulder flexion to 120 degrees    Baseline 135    Status Achieved      PT SHORT TERM GOAL #3   Title She will report 25% decr pain with hair care    Status Achieved             PT Long Term Goals - 08/10/19 0715      PT LONG TERM GOAL #1   Title She will report independence with all HEP issued    Status Achieved      PT LONG TERM GOAL #2   Title She will report  no pain with hair care    Status Achieved      PT LONG TERM GOAL #3   Title She will report able to mop floor with no lingering pain    Status Partially Met      PT LONG TERM GOAL #4   Title she will be able to drive with no pain    Status Achieved      PT LONG TERM GOAL #5   Title FOTO score will improve to 66    Baseline 77% today    Status Achieved                 Plan - 08/10/19 0716    Clinical Impression Statement FOTO score exceeded predicted  level of 66% to 77% so excellant functional improvement.  We wil plan on diuscharge as she feel she is doing well and cna progress at home.  overhead  ROM still limited  but she shoiuld be able to improve if she stretches at home consistently    PT Treatment/Interventions Passive range of motion;Taping;Therapeutic exercise;Therapeutic activities;Manual techniques;Electrical Stimulation;Cryotherapy;Iontophoresis 57m/ml Dexamethasone;Moist Heat;Ultrasound;Patient/family education    PT Next Visit Plan Discharge with HEP    PT Home Exercise Plan shoulder rolls and table slide, band row/ext/IR /ER adduction stretch  IR/ER stretching , behind back  scaption isometrics    Consulted and Agree with Plan of Care Patient           Patient will benefit from skilled therapeutic intervention in order to improve the following deficits and impairments:  Pain, Impaired UE functional use, Decreased activity tolerance  Visit Diagnosis: Stiffness of left shoulder, not elsewhere classified  Left shoulder pain, unspecified chronicity     Problem List Patient Active Problem List   Diagnosis Date Noted  . Gastritis 12/16/2017  . Abdominal pain 12/15/2017  . Epigastric pain 12/15/2017  . Hyperlipemia 12/15/2017  . GERD (gastroesophageal reflux disease) 12/15/2017  . CKD (chronic kidney disease), stage III 12/15/2017  . Multiple renal cysts 12/15/2017  . Angioedema 12/05/2017  . Acute respiratory failure with hypoxemia ([facility]   . Essential hypertension   . Family history of breast cancer in female 04/12/2015  . Family history of ovarian cancer 04/12/2015  . Cervical radiculopathy 04/06/2014  . Carpal tunnel syndrome of left wrist 04/06/2014  . Left arm pain 10/27/2013  . Cervical disc disorder with radiculopathy of cervical region 10/27/2013  . Ischemic chest pain ([facility] 10/17/2013  . Chest pain 10/17/2013  . Airway compromise 06/28/2013  . Dizziness 06/02/2013  . Headache(784.0) 06/02/2013  .  Drowsiness 06/02/2013  . Neck pain  06/02/2013  . ABNORMALITY OF GAIT 03/15/2009  . ANKLE PAIN, BILATERAL 03/14/2009  . Pain in limb 03/14/2009    Darrel Hoover 08/10/2019, 7:43 AM  [facility] [address] [place], [place], [address] Phone: [phone]   Fax:  [phone]  Name: [name] MRN: [id number] Date of Birth: 1955-07-25  PHYSICAL THERAPY DISCHARGE SUMMARY  Visits from Start of Care:9  Current functional level related to goals / functional outcomes: See above     Remaining deficits: Still limited overhead reach but no pain   Education / Equipment: HEP Plan: Patient agrees to discharge.  Patient goals were met. Patient is being discharged due to being pleased with the current functional level.  ?????    Pearson Forster PT  08/10/19

## 2019-09-20 DIAGNOSIS — Q6102 Congenital multiple renal cysts: Secondary | ICD-10-CM | POA: Diagnosis not present

## 2019-09-20 DIAGNOSIS — I129 Hypertensive chronic kidney disease with stage 1 through stage 4 chronic kidney disease, or unspecified chronic kidney disease: Secondary | ICD-10-CM | POA: Diagnosis not present

## 2019-09-20 DIAGNOSIS — N2581 Secondary hyperparathyroidism of renal origin: Secondary | ICD-10-CM | POA: Diagnosis not present

## 2019-09-20 DIAGNOSIS — N183 Chronic kidney disease, stage 3 unspecified: Secondary | ICD-10-CM | POA: Diagnosis not present

## 2019-09-20 DIAGNOSIS — Z87898 Personal history of other specified conditions: Secondary | ICD-10-CM | POA: Diagnosis not present

## 2019-09-28 MED FILL — METOPROLOL TARTRATE 50 MG T: 50 | 90 days supply | Qty: 180 | Fill #1

## 2019-09-28 MED FILL — cloNIDine 0.2 MG/24HR PTWK: 0.2 | 84 days supply | Qty: 12 | Fill #0

## 2019-09-28 MED FILL — ALBUTEROL SULFATE HFA 108 (: 108 (90 BAS | 17 days supply | Qty: 18 | Fill #0

## 2019-10-09 MED FILL — ESOMEPRAZOLE MAG DR 40 MG C: 40 | 90 days supply | Qty: 180 | Fill #1

## 2019-10-09 MED FILL — SPIRONOLACTONE 100 MG TAB: 100 | 90 days supply | Qty: 90 | Fill #2

## 2019-12-06 DIAGNOSIS — G4733 Obstructive sleep apnea (adult) (pediatric): Secondary | ICD-10-CM | POA: Diagnosis not present

## 2019-12-22 DIAGNOSIS — Z23 Encounter for immunization: Secondary | ICD-10-CM | POA: Diagnosis not present

## 2019-12-22 DIAGNOSIS — K219 Gastro-esophageal reflux disease without esophagitis: Secondary | ICD-10-CM | POA: Diagnosis not present

## 2019-12-22 DIAGNOSIS — G4733 Obstructive sleep apnea (adult) (pediatric): Secondary | ICD-10-CM | POA: Diagnosis not present

## 2019-12-22 DIAGNOSIS — E785 Hyperlipidemia, unspecified: Secondary | ICD-10-CM | POA: Diagnosis not present

## 2019-12-22 DIAGNOSIS — F4320 Adjustment disorder, unspecified: Secondary | ICD-10-CM | POA: Diagnosis not present

## 2019-12-22 DIAGNOSIS — Z0000 Encounter for general adult medical examination without abnormal findings: Secondary | ICD-10-CM | POA: Diagnosis not present

## 2019-12-22 DIAGNOSIS — K760 Fatty (change of) liver, not elsewhere classified: Secondary | ICD-10-CM | POA: Diagnosis not present

## 2019-12-22 DIAGNOSIS — I10 Essential (primary) hypertension: Secondary | ICD-10-CM | POA: Diagnosis not present

## 2019-12-22 DIAGNOSIS — Z79899 Other long term (current) drug therapy: Secondary | ICD-10-CM | POA: Diagnosis not present

## 2019-12-22 DIAGNOSIS — J45909 Unspecified asthma, uncomplicated: Secondary | ICD-10-CM | POA: Diagnosis not present

## 2019-12-22 DIAGNOSIS — N281 Cyst of kidney, acquired: Secondary | ICD-10-CM | POA: Diagnosis not present

## 2019-12-22 DIAGNOSIS — E559 Vitamin D deficiency, unspecified: Secondary | ICD-10-CM | POA: Diagnosis not present

## 2020-01-06 DIAGNOSIS — S29011A Strain of muscle and tendon of front wall of thorax, initial encounter: Secondary | ICD-10-CM | POA: Diagnosis not present

## 2020-01-16 ENCOUNTER — Encounter: Payer: 59 | Admitting: Sports Medicine

## 2020-01-30 MED FILL — METOPROLOL TARTRATE 50 MG T: 50 | 90 days supply | Qty: 180 | Fill #2

## 2020-01-30 MED FILL — ESOMEPRAZOLE MAG DR 40 MG C: 40 | 90 days supply | Qty: 180 | Fill #2

## 2020-01-31 ENCOUNTER — Other Ambulatory Visit (HOSPITAL_COMMUNITY): Payer: Self-pay | Admitting: Internal Medicine

## 2020-01-31 MED FILL — spIRONOLACTONE 100 MG TAB: 100 | 90 days supply | Qty: 90 | Fill #0

## 2020-02-13 ENCOUNTER — Ambulatory Visit: Payer: 59 | Admitting: Sports Medicine

## 2020-02-13 ENCOUNTER — Other Ambulatory Visit: Payer: Self-pay

## 2020-02-13 DIAGNOSIS — R269 Unspecified abnormalities of gait and mobility: Secondary | ICD-10-CM

## 2020-02-13 DIAGNOSIS — M25579 Pain in unspecified ankle and joints of unspecified foot: Secondary | ICD-10-CM

## 2020-02-13 NOTE — Progress Notes (Signed)
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  I placed her in orthotics ~ 8 years ago and this lessened the ankle pain to where she could work on hospital floors.  Now works from home but still notes that RT ankle wants to swell and is more painful.  Left ankle pain pretty well controlled in orthotics.  Remote ankles surgeon consult had suggested TAR  ROS Swelling RT ankle Stiffness RT ankle  PE Claytor F in NAD BP 108/82   Ht 5\' 8"  (1.727 m)   Wt 221 lb (100.2 kg)   LMP 07/05/2009   BMI 33.60 kg/m    RT ankle with mild limitaition of motion Swelling throughout the sinus tarsi Loss of long arch Mild bunion change RT is 2 cm shorter  LT ankle No swelling noted Normal ROM Mild bunion Loss of arch  Gait Trendelenburg to RT Genu valgus

## 2020-02-13 NOTE — Assessment & Plan Note (Signed)
Patient was fitted for a : standard, cushioned, semi-rigid orthotic. The orthotic was heated and afterward the patient stood on the orthotic blank positioned on the orthotic stand. The patient was positioned in subtalar neutral position and 10 degrees of ankle dorsiflexion in a weight bearing stance. After completion of molding, a stable base was applied to the orthotic blank. The blank was ground to a stable position for weight bearing. Size: 11 fast tech support black Base: blue med. EVA Posting: Medial wedge with lift on RT Additional orthotic padding:: Scaphoid pad on LT  Note: after completion she had good comfort and less trendelenburg.

## 2020-02-13 NOTE — Assessment & Plan Note (Signed)
Orthotic support improved her ankle pain  Adding a lift may help  Keep using these in all shoes and use compression to help with swelling Iding

## 2020-03-04 DIAGNOSIS — Q6102 Congenital multiple renal cysts: Secondary | ICD-10-CM | POA: Diagnosis not present

## 2020-03-04 DIAGNOSIS — I129 Hypertensive chronic kidney disease with stage 1 through stage 4 chronic kidney disease, or unspecified chronic kidney disease: Secondary | ICD-10-CM | POA: Diagnosis not present

## 2020-03-04 DIAGNOSIS — E785 Hyperlipidemia, unspecified: Secondary | ICD-10-CM | POA: Diagnosis not present

## 2020-03-04 DIAGNOSIS — N1831 Chronic kidney disease, stage 3a: Secondary | ICD-10-CM | POA: Diagnosis not present

## 2020-03-04 DIAGNOSIS — Z87898 Personal history of other specified conditions: Secondary | ICD-10-CM | POA: Diagnosis not present

## 2020-03-06 ENCOUNTER — Other Ambulatory Visit: Payer: Self-pay | Admitting: Internal Medicine

## 2020-03-06 DIAGNOSIS — Z1231 Encounter for screening mammogram for malignant neoplasm of breast: Secondary | ICD-10-CM

## 2020-04-15 ENCOUNTER — Other Ambulatory Visit (HOSPITAL_COMMUNITY): Payer: Self-pay

## 2020-04-15 MED ORDER — CLONIDINE 0.2 MG/24HR TD PTWK
0.2000 mg | MEDICATED_PATCH | TRANSDERMAL | 3 refills | Status: DC
  Filled 2020-04-15: qty 12, 84d supply, fill #0
  Filled 2020-08-21: qty 12, 84d supply, fill #1

## 2020-04-16 ENCOUNTER — Other Ambulatory Visit (HOSPITAL_COMMUNITY): Payer: Self-pay

## 2020-05-01 ENCOUNTER — Ambulatory Visit
Admission: RE | Admit: 2020-05-01 | Discharge: 2020-05-01 | Disposition: A | Discharge status: Home / Self Care | Payer: 59 | Source: Ambulatory Visit | Attending: Internal Medicine | Admitting: Internal Medicine

## 2020-05-01 ENCOUNTER — Other Ambulatory Visit: Payer: Self-pay

## 2020-05-01 DIAGNOSIS — Z1231 Encounter for screening mammogram for malignant neoplasm of breast: Secondary | ICD-10-CM

## 2020-05-09 ENCOUNTER — Other Ambulatory Visit (HOSPITAL_COMMUNITY): Payer: Self-pay

## 2020-05-09 MED FILL — Spironolactone Tab 100 MG: ORAL | 90 days supply | Qty: 90 | Fill #0 | Status: AC

## 2020-05-13 ENCOUNTER — Other Ambulatory Visit (HOSPITAL_COMMUNITY): Payer: Self-pay

## 2020-05-13 MED ORDER — ALBUTEROL SULFATE HFA 108 (90 BASE) MCG/ACT IN AERS
2.0000 | INHALATION_SPRAY | Freq: Four times a day (QID) | RESPIRATORY_TRACT | 2 refills | Status: DC | PRN
  Filled 2020-05-13: qty 18, 25d supply, fill #0

## 2020-05-30 ENCOUNTER — Other Ambulatory Visit (HOSPITAL_COMMUNITY): Payer: Self-pay

## 2020-05-30 MED ORDER — ESOMEPRAZOLE MAGNESIUM 40 MG PO CPDR
40.0000 mg | DELAYED_RELEASE_CAPSULE | Freq: Two times a day (BID) | ORAL | 3 refills | Status: DC
  Filled 2020-05-30: qty 180, 90d supply, fill #0
  Filled 2020-10-08: qty 180, 90d supply, fill #1
  Filled 2021-02-20: qty 180, 90d supply, fill #2

## 2020-06-05 IMAGING — DX DG CHEST 2V
2 series · 2 of 2 positions shown · non-contrast
Comparison: 10/01/2017

CLINICAL DATA: Pt began a new BP drug this morning and shortly
after began having SOB, dizziness, blurred vision, increased BP - hx
of htn, GERD, MVP, sleep apnea

EXAM:
CHEST - 2 VIEW

[chest lat]
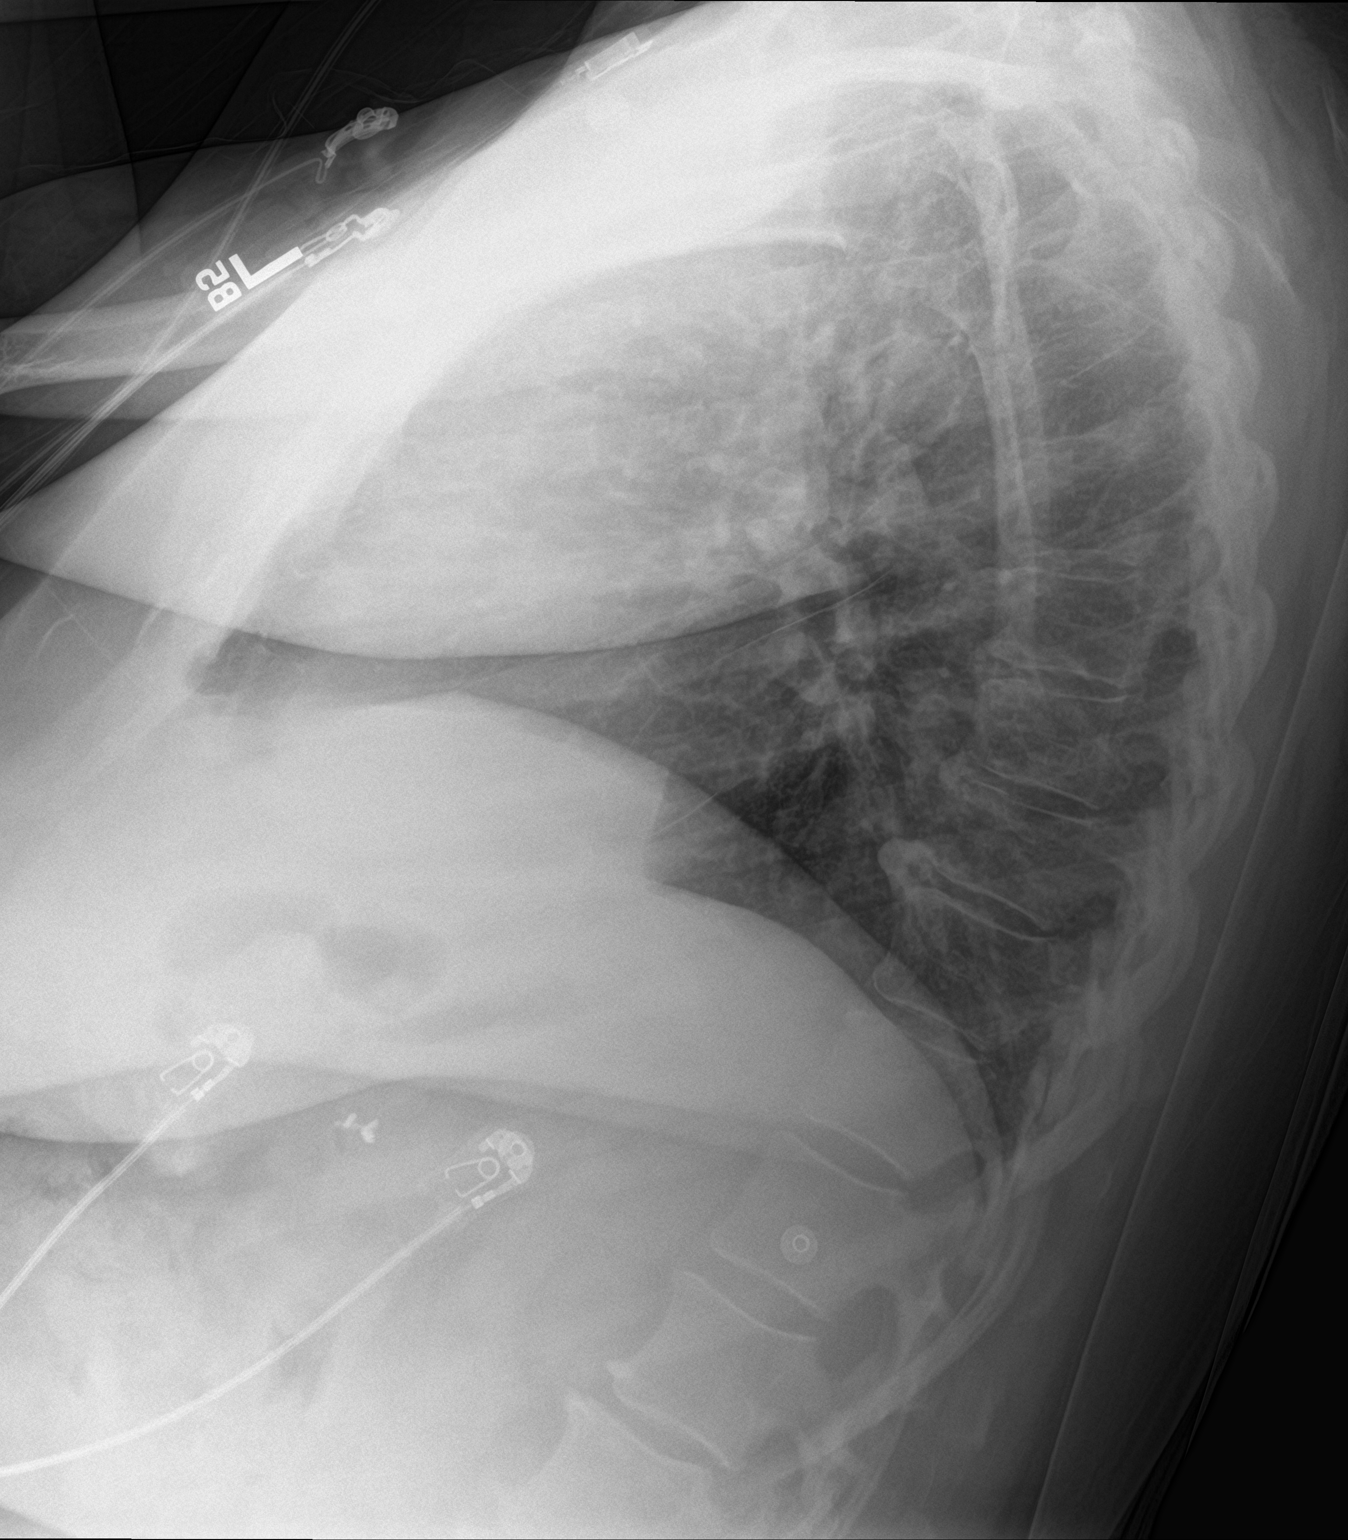

[chest ap]
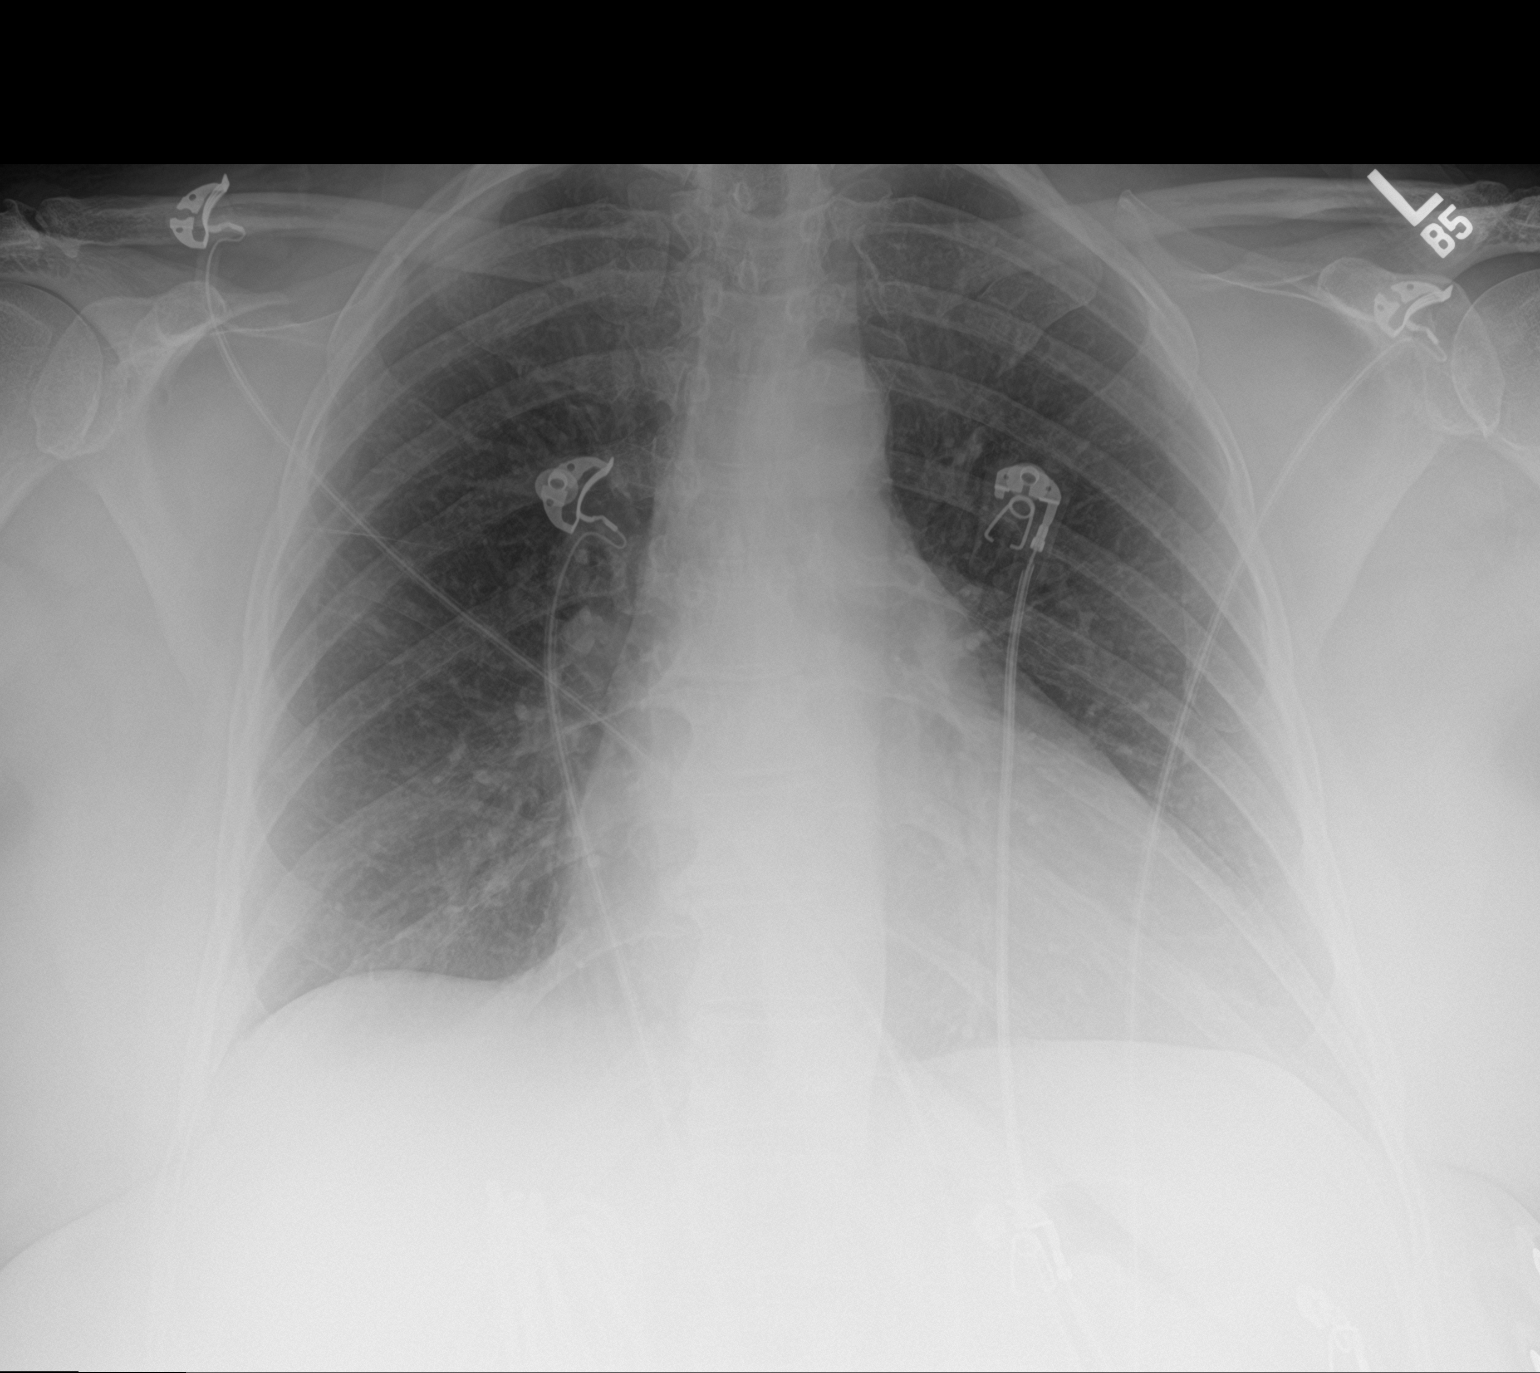

[2 of 2 positions shown; findings below may reference images not displayed]

FINDINGS: Heart size is accentuated by AP position of the patient. There are
no focal consolidations or pleural effusions. No pulmonary edema.
Mild midthoracic spondylosis.
IMPRESSION: No evidence for acute cardiopulmonary abnormality.

## 2020-06-21 IMAGING — DX DG CHEST 1V PORT
1 series · 1 of 1 positions shown · non-contrast
Comparison: 11/19/2017 chest radiograph.

CLINICAL DATA: Acute respiratory failure, intubated

EXAM:
PORTABLE CHEST 1 VIEW

[chest]
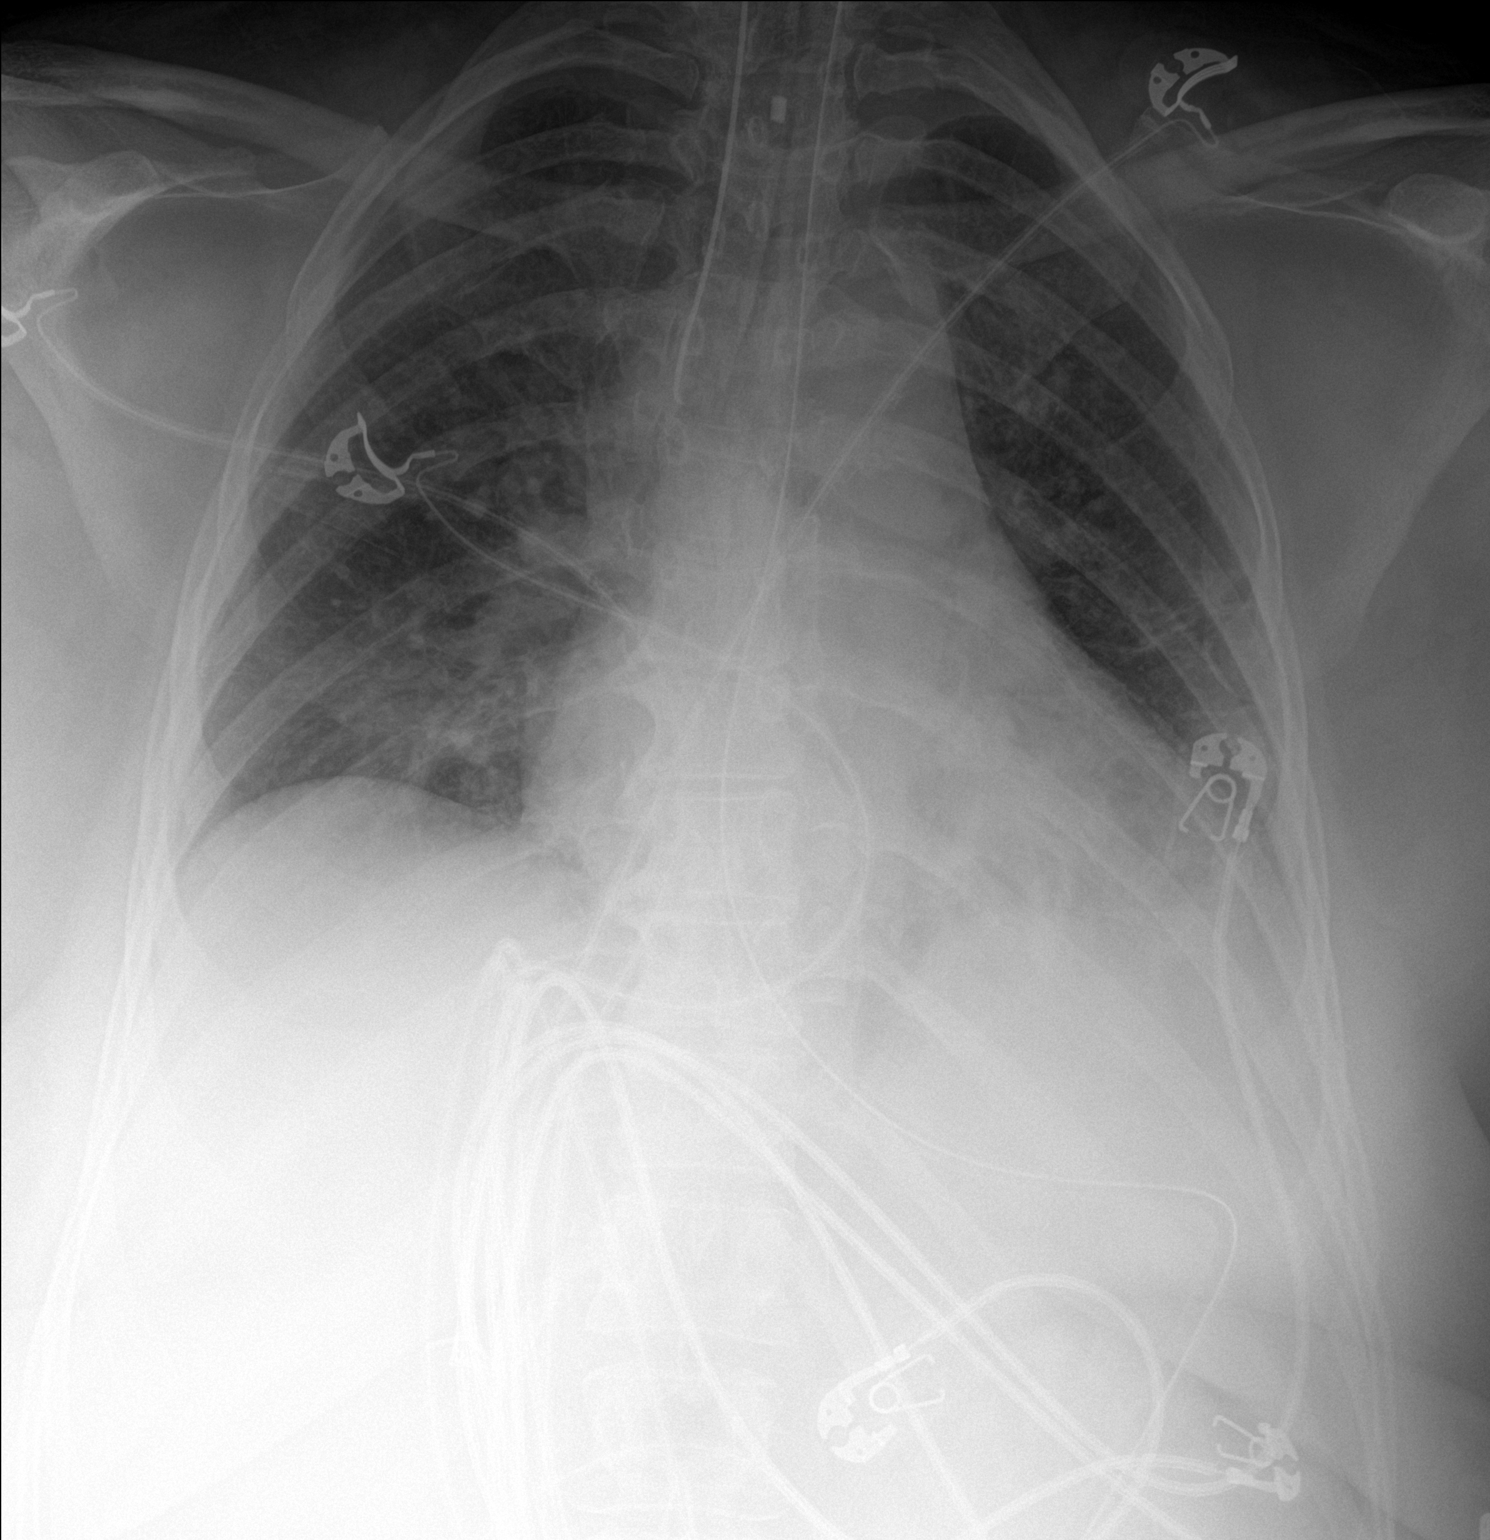

[1 of 1 positions shown; findings below may reference images not displayed]

FINDINGS: Endotracheal tube tip is 1.6 cm above the carina. Enteric tube
enters stomach with the tip not seen on this image. Stable
cardiomediastinal silhouette with mild cardiomegaly. No
pneumothorax. Possible small left pleural effusion. No right pleural
effusion. Borderline mild pulmonary edema.
IMPRESSION: 1. Endotracheal tube tip is 1.6 cm above the carina, consider
retracting 1 cm. Well-positioned enteric tube.
2. Mild cardiomegaly with borderline mild pulmonary edema. Possible
small left pleural effusion.

## 2020-07-10 ENCOUNTER — Other Ambulatory Visit (HOSPITAL_BASED_OUTPATIENT_CLINIC_OR_DEPARTMENT_OTHER): Payer: Self-pay | Admitting: Chiropractor

## 2020-07-10 DIAGNOSIS — M542 Cervicalgia: Secondary | ICD-10-CM

## 2020-07-10 DIAGNOSIS — M545 Low back pain, unspecified: Secondary | ICD-10-CM

## 2020-07-11 ENCOUNTER — Other Ambulatory Visit: Payer: Self-pay

## 2020-07-11 ENCOUNTER — Ambulatory Visit (HOSPITAL_BASED_OUTPATIENT_CLINIC_OR_DEPARTMENT_OTHER)
Admission: RE | Admit: 2020-07-11 | Discharge: 2020-07-11 | Disposition: A | Discharge status: Home / Self Care | Payer: 59 | Source: Ambulatory Visit | Attending: Chiropractor | Admitting: Chiropractor

## 2020-07-11 DIAGNOSIS — M545 Low back pain, unspecified: Secondary | ICD-10-CM | POA: Diagnosis not present

## 2020-07-11 DIAGNOSIS — M47812 Spondylosis without myelopathy or radiculopathy, cervical region: Secondary | ICD-10-CM | POA: Diagnosis not present

## 2020-07-11 DIAGNOSIS — M5136 Other intervertebral disc degeneration, lumbar region: Secondary | ICD-10-CM | POA: Diagnosis not present

## 2020-07-11 DIAGNOSIS — M542 Cervicalgia: Secondary | ICD-10-CM | POA: Diagnosis not present

## 2020-07-11 DIAGNOSIS — M5030 Other cervical disc degeneration, unspecified cervical region: Secondary | ICD-10-CM | POA: Diagnosis not present

## 2020-07-11 DIAGNOSIS — M47816 Spondylosis without myelopathy or radiculopathy, lumbar region: Secondary | ICD-10-CM | POA: Diagnosis not present

## 2020-07-11 DIAGNOSIS — M4316 Spondylolisthesis, lumbar region: Secondary | ICD-10-CM | POA: Diagnosis not present

## 2020-07-11 DIAGNOSIS — M4312 Spondylolisthesis, cervical region: Secondary | ICD-10-CM | POA: Diagnosis not present

## 2020-07-24 ENCOUNTER — Other Ambulatory Visit (HOSPITAL_COMMUNITY): Payer: Self-pay

## 2020-07-24 MED ORDER — OMRON 3 SERIES BP MONITOR DEVI
0 refills | Status: DC
  Filled 2020-07-24: qty 1, 1d supply, fill #0

## 2020-07-24 MED ORDER — METOPROLOL TARTRATE 50 MG PO TABS
ORAL_TABLET | ORAL | 1 refills | Status: DC
  Filled 2020-07-24: qty 135, 90d supply, fill #0
  Filled 2020-11-20: qty 135, 90d supply, fill #1

## 2020-07-25 ENCOUNTER — Other Ambulatory Visit (HOSPITAL_COMMUNITY): Payer: Self-pay

## 2020-07-26 DIAGNOSIS — G4733 Obstructive sleep apnea (adult) (pediatric): Secondary | ICD-10-CM | POA: Diagnosis not present

## 2020-07-30 ENCOUNTER — Other Ambulatory Visit (HOSPITAL_COMMUNITY): Payer: Self-pay

## 2020-08-06 ENCOUNTER — Other Ambulatory Visit (HOSPITAL_COMMUNITY): Payer: Self-pay

## 2020-08-21 ENCOUNTER — Other Ambulatory Visit (HOSPITAL_COMMUNITY): Payer: Self-pay

## 2020-08-23 ENCOUNTER — Other Ambulatory Visit (HOSPITAL_COMMUNITY): Payer: Self-pay

## 2020-08-27 ENCOUNTER — Other Ambulatory Visit (HOSPITAL_COMMUNITY): Payer: Self-pay

## 2020-08-27 MED FILL — Spironolactone Tab 100 MG: ORAL | 90 days supply | Qty: 90 | Fill #1 | Status: AC

## 2020-08-29 ENCOUNTER — Other Ambulatory Visit (HOSPITAL_COMMUNITY): Payer: Self-pay

## 2020-09-12 ENCOUNTER — Ambulatory Visit: Payer: 59

## 2020-09-20 ENCOUNTER — Other Ambulatory Visit (HOSPITAL_COMMUNITY): Payer: Self-pay

## 2020-09-20 DIAGNOSIS — N2581 Secondary hyperparathyroidism of renal origin: Secondary | ICD-10-CM | POA: Diagnosis not present

## 2020-09-20 DIAGNOSIS — J329 Chronic sinusitis, unspecified: Secondary | ICD-10-CM | POA: Diagnosis not present

## 2020-09-20 DIAGNOSIS — N1831 Chronic kidney disease, stage 3a: Secondary | ICD-10-CM | POA: Diagnosis not present

## 2020-09-20 DIAGNOSIS — E785 Hyperlipidemia, unspecified: Secondary | ICD-10-CM | POA: Diagnosis not present

## 2020-09-20 DIAGNOSIS — I129 Hypertensive chronic kidney disease with stage 1 through stage 4 chronic kidney disease, or unspecified chronic kidney disease: Secondary | ICD-10-CM | POA: Diagnosis not present

## 2020-09-20 MED ORDER — CLINDAMYCIN HCL 300 MG PO CAPS
600.0000 mg | ORAL_CAPSULE | Freq: Two times a day (BID) | ORAL | 0 refills | Status: DC
  Filled 2020-09-20: qty 28, 7d supply, fill #0

## 2020-10-08 ENCOUNTER — Other Ambulatory Visit (HOSPITAL_COMMUNITY): Payer: Self-pay

## 2020-11-18 ENCOUNTER — Ambulatory Visit (INDEPENDENT_AMBULATORY_CARE_PROVIDER_SITE_OTHER): Payer: 59 | Admitting: Obstetrics & Gynecology

## 2020-11-18 ENCOUNTER — Other Ambulatory Visit: Payer: Self-pay

## 2020-11-18 ENCOUNTER — Encounter (HOSPITAL_BASED_OUTPATIENT_CLINIC_OR_DEPARTMENT_OTHER): Payer: Self-pay | Admitting: Obstetrics & Gynecology

## 2020-11-18 VITALS — BP 117/54 | HR 63 | Ht 67.5 in | Wt 234.2 lb

## 2020-11-18 DIAGNOSIS — T783XXD Angioneurotic edema, subsequent encounter: Secondary | ICD-10-CM

## 2020-11-18 DIAGNOSIS — Z780 Asymptomatic menopausal state: Secondary | ICD-10-CM | POA: Diagnosis not present

## 2020-11-18 DIAGNOSIS — Z23 Encounter for immunization: Secondary | ICD-10-CM | POA: Diagnosis not present

## 2020-11-18 DIAGNOSIS — Z01419 Encounter for gynecological examination (general) (routine) without abnormal findings: Secondary | ICD-10-CM | POA: Diagnosis not present

## 2020-11-18 DIAGNOSIS — I10 Essential (primary) hypertension: Secondary | ICD-10-CM

## 2020-11-18 DIAGNOSIS — N183 Chronic kidney disease, stage 3 unspecified: Secondary | ICD-10-CM

## 2020-11-18 NOTE — Progress Notes (Signed)
65 y.o. [id number] Married [place] American female here for annual exam.  Doing well.  Planning retirement after her birthday.  Denies vaginal bleeding.    Patient's last menstrual period was 07/05/2009.          Sexually active: No.  The current method of family planning is post menopausal status.    Exercising: Yes.     Walking her dog Smoker:  no  Health Maintenance: Pap:  06/22/2018 neg with neg HR HPV History of abnormal Pap:  remote hx MMG:  05/01/2020 Negative Colonoscopy:  2019 BMD:   11/27/2018 Normal Screening Labs: 01/2020, Dr. gates   reports that she has never smoked. She has never used smokeless tobacco. She reports that she does not drink alcohol and does not use drugs.  Past Medical History:  Diagnosis Date   Abnormal Pap smear of cervix    Anemia    as a child   Anxiety    Bulging lumbar disc    Congestive heart failure (CHF) ([facility])    GERD (gastroesophageal reflux disease)    Hypertension    Lactose intolerance    MVP (mitral valve prolapse)    Pyelonephritis    Renal disorder    Sleep apnea    Spinal stenosis     Past Surgical History:  Procedure Laterality Date   ankle fracture  10/09   bilateral-right was surgically repaired   ANKLE SURGERY  10/10   screws removed   ANKLE SURGERY  2/12   removal of all hardware right ankle   APPENDECTOMY     arm surgery     plate in left arm   BREAST LUMPECTOMY     right breast   CHOLECYSTECTOMY     DILATION AND CURETTAGE OF UTERUS      Current Outpatient Medications  Medication Sig Dispense Refill   albuterol (VENTOLIN HFA) 108 (90 Base) MCG/ACT inhaler Inhale 2 puffs by mouth every 6 hours as needed 18 g 2   Blood Pressure Monitoring (OMRON 3 SERIES BP MONITOR) DEVI Use as directed. 1 each 0   Calcium-Magnesium-Vitamin D 600-40-500 MG-MG-UNIT TB24 Take 3 tablets by mouth daily.     cloNIDine (CATAPRES - DOSED IN MG/24 HR) 0.2 mg/24hr patch Place 1 patch (0.2 mg total) onto the skin once a week. 12  patch 3   diclofenac sodium (VOLTAREN) 1 % GEL Apply 2 g topically as needed. To affected joint  5   diphenhydrAMINE (BENADRYL) 25 mg capsule Take 25 mg by mouth every 6 (six) hours as needed.     diphenhydrAMINE-zinc acetate (BENADRYL EXTRA STRENGTH) cream Apply 1 application topically 3 (three) times daily as needed for itching (Apply on hands). 28.4 g 0   esomeprazole (NEXIUM) 40 MG capsule Take 1 capsule (40 mg total) by mouth 2 (two) times daily. 180 capsule 3   famotidine (PEPCID) 20 MG tablet Take 1 tablet (20 mg total) by mouth 2 (two) times daily. 14 tablet 0   furosemide (LASIX) 20 MG tablet      metoprolol tartrate (LOPRESSOR) 50 MG tablet Take 1 tablet by mouth in the morning & 1/2 tab in the evening 135 tablet 1   nitroGLYCERIN (NITROSTAT) 0.4 MG SL tablet Place 1 tablet (0.4 mg total) under the tongue every 5 (five) minutes as needed for chest pain. 30 tablet 12   ondansetron (ZOFRAN) 4 MG tablet Take 1 tablet (4 mg total) by mouth every 6 (six) hours as needed for nausea. 20 tablet 0  spironolactone (ALDACTONE) 100 MG tablet Take 1 tablet (100 mg total) by mouth daily. 30 tablet 0   No current facility-administered medications for this visit.    Family History  Problem Relation Age of Onset   Diabetes Maternal Grandmother    Stroke Maternal Grandmother    Kidney disease Maternal Grandmother    Ovarian cancer Paternal Grandmother        dx. late 30s-early 40s   Prostate cancer Paternal Grandfather    Ovarian cancer Paternal Aunt        (x3) paternal aunts dx. ages 30s-late 70s   Breast cancer Maternal Aunt        dx. 22s   Hypertension Mother    Heart disease Mother    Goiter Mother    Other Mother        breast lumpectomy in early 7s, was in the hospital for 5 days after   Heart disease Father    Prostate cancer Father 52       s/p orchiectomy   Stroke Father    Ulcers Brother    Other Brother        elevated PSA level w/ surveillance   Colon polyps Brother         less than 10   Pyelonephritis Daughter    Breast cancer Sister 25       left breast ca - poorly differentiated carcinoma with squamous metaplasia; triple neg; has since had BL mastectomies   Colon polyps Sister        less than 10   Congestive Heart Failure Sister    Prostate cancer Paternal Uncle        (x5) paternal uncles dx. prostate cancer in their 15s   Prostate cancer Maternal Grandfather        d. late 22s with mets to colon   Other Other 39       NOS benign brain tumor; +fluid   Prostate cancer Cousin 70       paternal 1st cousin    Prostate cancer Cousin        paternal 1st cousin, once-removed dx. 35s   Prostate cancer Cousin        (x2) paternal 1st cousins, once-removed, dx. early 85s   Lung cancer Brother 27       paternal half-brother; smoker   Uterine cancer Maternal Aunt        dx. 64s   Breast cancer Maternal Aunt        dx. bilateral breast cancers - 70s, 32s   Liver cancer Maternal Aunt        +hepatitis C; d. 60s   Congestive Heart Failure Maternal Uncle 68   Stroke Maternal Uncle        d. 30s; (x2 maternal uncles)   Cancer Cousin 69       sinus cancer dx. 35s (maternal 1st cousin)   Lung cancer Cousin        maternal 1st cousin dx. 60s/smoker; maternal 1st cousin   Pancreatic cancer Cousin        maternal 1st cousin dx. late 35s   Prostate cancer Cousin        maternal 1st cousin dx. 1   Breast cancer Cousin        (x4) maternal 1st cousins dx. late 19s, 47s    Review of Systems  All other systems reviewed and are negative.  Exam:   BP (!) 117/54 (BP Location: Right Arm, Patient Position: Sitting, Cuff Size:  Large)   Pulse 63   Ht 5' 7.5" (1.715 m)   Wt 234 lb 3.2 oz (106.2 kg)   LMP 07/05/2009   BMI 36.14 kg/m   Height: 5' 7.5" (171.5 cm)  General appearance: alert, cooperative and appears stated age Head: Normocephalic, without obvious abnormality, atraumatic Neck: no adenopathy, supple, symmetrical, trachea midline and thyroid  normal to inspection and palpation Lungs: clear to auscultation bilaterally Breasts: normal appearance, no masses or tenderness, prior lumpectomy site on right stable Heart: regular rate and rhythm Abdomen: soft, non-tender; bowel sounds normal; no masses,  no organomegaly Extremities: extremities normal, atraumatic, no cyanosis or edema Skin: Skin color, texture, turgor normal. No rashes or lesions Lymph nodes: Cervical, supraclavicular, and axillary nodes normal. No abnormal inguinal nodes palpated Neurologic: Grossly normal   Pelvic: External genitalia:  no lesions              Urethra:  normal appearing urethra with no masses, tenderness or lesions              Bartholins and Skenes: normal                 Vagina: normal appearing vagina with normal color and no discharge, no lesions              Cervix: no lesions              Pap taken: No. Bimanual Exam:  Uterus:  normal size, contour, position, consistency, mobility, non-tender              Adnexa: normal adnexa and no mass, fullness, tenderness               Rectovaginal: Confirms               Anus:  normal sphincter tone, no lesions  Chaperone, Octaviano Batty, CMA, was present for exam.  Assessment/Plan: 1. Well woman exam with routine gynecological exam - pap 06/2018.  Will repeat next year. - MMG 04/2020 - colonoscopy 2019.  Release signed today as is not on chart. - normal BMD 01/2018.  Recommend repeat in 2 more years - vaccines reviewed.  Tdap updated today. - lab work done with Dr. Inda Merlin  2. Postmenopausal - no HRT  3. Essential hypertension  4. Angioedema, subsequent encounter - after Covid vaccinations.  No additional boosters recommended.  5. Stage 3 chronic kidney disease, unspecified whether stage 3a or 3b CKD ([place])

## 2020-11-20 ENCOUNTER — Other Ambulatory Visit (HOSPITAL_COMMUNITY): Payer: Self-pay

## 2020-11-21 ENCOUNTER — Encounter (HOSPITAL_BASED_OUTPATIENT_CLINIC_OR_DEPARTMENT_OTHER): Payer: Self-pay | Admitting: *Deleted

## 2020-11-25 ENCOUNTER — Other Ambulatory Visit (HOSPITAL_COMMUNITY): Payer: Self-pay

## 2020-11-25 MED ORDER — SPIRONOLACTONE 100 MG PO TABS
100.0000 mg | ORAL_TABLET | Freq: Every day | ORAL | 3 refills | Status: DC
  Filled 2020-11-25: qty 90, 90d supply, fill #0
  Filled 2021-03-03: qty 90, 90d supply, fill #1
  Filled 2021-06-18: qty 90, 90d supply, fill #2
  Filled 2021-10-09: qty 90, 90d supply, fill #3

## 2020-11-29 ENCOUNTER — Other Ambulatory Visit (HOSPITAL_COMMUNITY): Payer: Self-pay

## 2020-12-09 DIAGNOSIS — G4733 Obstructive sleep apnea (adult) (pediatric): Secondary | ICD-10-CM | POA: Diagnosis not present

## 2020-12-09 DIAGNOSIS — G4700 Insomnia, unspecified: Secondary | ICD-10-CM | POA: Diagnosis not present

## 2020-12-31 ENCOUNTER — Other Ambulatory Visit (HOSPITAL_COMMUNITY): Payer: Self-pay

## 2020-12-31 MED ORDER — CLONIDINE 0.2 MG/24HR TD PTWK
0.2000 mg | MEDICATED_PATCH | TRANSDERMAL | 3 refills | Status: DC
  Filled 2020-12-31: qty 12, 84d supply, fill #0
  Filled 2021-05-01: qty 12, 84d supply, fill #1
  Filled 2021-10-09: qty 12, 84d supply, fill #2

## 2021-01-03 ENCOUNTER — Other Ambulatory Visit (HOSPITAL_COMMUNITY): Payer: Self-pay

## 2021-01-03 MED ORDER — CARESTART COVID-19 HOME TEST VI KIT
PACK | 0 refills | Status: DC
  Filled 2021-01-03: qty 4, 4d supply, fill #0

## 2021-01-22 ENCOUNTER — Other Ambulatory Visit (HOSPITAL_COMMUNITY): Payer: Self-pay

## 2021-01-22 DIAGNOSIS — G4733 Obstructive sleep apnea (adult) (pediatric): Secondary | ICD-10-CM | POA: Diagnosis not present

## 2021-01-22 DIAGNOSIS — K760 Fatty (change of) liver, not elsewhere classified: Secondary | ICD-10-CM | POA: Diagnosis not present

## 2021-01-22 DIAGNOSIS — Z0000 Encounter for general adult medical examination without abnormal findings: Secondary | ICD-10-CM | POA: Diagnosis not present

## 2021-01-22 DIAGNOSIS — J45909 Unspecified asthma, uncomplicated: Secondary | ICD-10-CM | POA: Diagnosis not present

## 2021-01-22 DIAGNOSIS — E559 Vitamin D deficiency, unspecified: Secondary | ICD-10-CM | POA: Diagnosis not present

## 2021-01-22 DIAGNOSIS — I10 Essential (primary) hypertension: Secondary | ICD-10-CM | POA: Diagnosis not present

## 2021-01-22 DIAGNOSIS — E669 Obesity, unspecified: Secondary | ICD-10-CM | POA: Diagnosis not present

## 2021-01-22 DIAGNOSIS — F4320 Adjustment disorder, unspecified: Secondary | ICD-10-CM | POA: Diagnosis not present

## 2021-01-22 DIAGNOSIS — N281 Cyst of kidney, acquired: Secondary | ICD-10-CM | POA: Diagnosis not present

## 2021-01-22 DIAGNOSIS — K219 Gastro-esophageal reflux disease without esophagitis: Secondary | ICD-10-CM | POA: Diagnosis not present

## 2021-01-22 DIAGNOSIS — Z91013 Allergy to seafood: Secondary | ICD-10-CM | POA: Diagnosis not present

## 2021-01-22 DIAGNOSIS — E785 Hyperlipidemia, unspecified: Secondary | ICD-10-CM | POA: Diagnosis not present

## 2021-01-22 DIAGNOSIS — Z79899 Other long term (current) drug therapy: Secondary | ICD-10-CM | POA: Diagnosis not present

## 2021-01-22 DIAGNOSIS — G4700 Insomnia, unspecified: Secondary | ICD-10-CM | POA: Diagnosis not present

## 2021-01-22 MED ORDER — EPINEPHRINE 0.3 MG/0.3ML IJ SOAJ
0.3000 mg | INTRAMUSCULAR | 1 refills | Status: AC
  Filled 2021-01-22: qty 2, 14d supply, fill #0
  Filled 2021-02-21: qty 2, 1d supply, fill #0

## 2021-01-30 ENCOUNTER — Other Ambulatory Visit (HOSPITAL_COMMUNITY): Payer: Self-pay

## 2021-02-07 ENCOUNTER — Other Ambulatory Visit: Payer: 59 | Admitting: *Deleted

## 2021-02-10 NOTE — Patient Outreach (Signed)
[facility]) Care Management  02/10/2021  [name] 1955-05-20 [id number]  Opened in error.  Emelia Loron RN, [place] [phone] [email or web address]

## 2021-02-13 ENCOUNTER — Other Ambulatory Visit: Payer: 59 | Admitting: *Deleted

## 2021-02-13 NOTE — Patient Outreach (Signed)
[facility]) Care Management  02/13/2021  [name] 1955-09-17 [id number]  Opened in error.  Emelia Loron RN, [place] [phone] [email or web address]

## 2021-02-20 ENCOUNTER — Other Ambulatory Visit (HOSPITAL_COMMUNITY): Payer: Self-pay

## 2021-02-21 ENCOUNTER — Other Ambulatory Visit (HOSPITAL_COMMUNITY): Payer: Self-pay

## 2021-02-24 ENCOUNTER — Other Ambulatory Visit (HOSPITAL_COMMUNITY): Payer: Self-pay

## 2021-02-28 ENCOUNTER — Other Ambulatory Visit (HOSPITAL_BASED_OUTPATIENT_CLINIC_OR_DEPARTMENT_OTHER): Payer: Self-pay | Admitting: Obstetrics & Gynecology

## 2021-02-28 ENCOUNTER — Other Ambulatory Visit (HOSPITAL_COMMUNITY): Payer: Self-pay

## 2021-02-28 ENCOUNTER — Encounter (HOSPITAL_BASED_OUTPATIENT_CLINIC_OR_DEPARTMENT_OTHER): Payer: Self-pay | Admitting: Obstetrics & Gynecology

## 2021-02-28 MED ORDER — FLUCONAZOLE 150 MG PO TABS
ORAL_TABLET | ORAL | 0 refills | Status: DC
  Filled 2021-02-28: qty 2, 2d supply, fill #0

## 2021-03-03 ENCOUNTER — Other Ambulatory Visit (HOSPITAL_COMMUNITY): Payer: Self-pay

## 2021-03-03 MED ORDER — METOPROLOL TARTRATE 50 MG PO TABS
25.0000 mg | ORAL_TABLET | Freq: Two times a day (BID) | ORAL | 3 refills | Status: DC
  Filled 2021-03-03: qty 135, 90d supply, fill #0
  Filled 2021-06-18: qty 135, 90d supply, fill #1
  Filled 2021-10-09: qty 135, 90d supply, fill #2
  Filled 2022-01-28: qty 135, 90d supply, fill #3

## 2021-03-11 DIAGNOSIS — I10 Essential (primary) hypertension: Secondary | ICD-10-CM | POA: Diagnosis not present

## 2021-03-11 DIAGNOSIS — Z6836 Body mass index (BMI) 36.0-36.9, adult: Secondary | ICD-10-CM | POA: Diagnosis not present

## 2021-03-11 DIAGNOSIS — E6609 Other obesity due to excess calories: Secondary | ICD-10-CM | POA: Diagnosis not present

## 2021-03-11 DIAGNOSIS — E785 Hyperlipidemia, unspecified: Secondary | ICD-10-CM | POA: Diagnosis not present

## 2021-03-21 DIAGNOSIS — E785 Hyperlipidemia, unspecified: Secondary | ICD-10-CM | POA: Diagnosis not present

## 2021-03-21 DIAGNOSIS — I129 Hypertensive chronic kidney disease with stage 1 through stage 4 chronic kidney disease, or unspecified chronic kidney disease: Secondary | ICD-10-CM | POA: Diagnosis not present

## 2021-03-21 DIAGNOSIS — Q6102 Congenital multiple renal cysts: Secondary | ICD-10-CM | POA: Diagnosis not present

## 2021-03-21 DIAGNOSIS — N1831 Chronic kidney disease, stage 3a: Secondary | ICD-10-CM | POA: Diagnosis not present

## 2021-04-02 ENCOUNTER — Other Ambulatory Visit (HOSPITAL_COMMUNITY): Payer: Self-pay

## 2021-04-08 DIAGNOSIS — Z6835 Body mass index (BMI) 35.0-35.9, adult: Secondary | ICD-10-CM | POA: Diagnosis not present

## 2021-04-08 DIAGNOSIS — E785 Hyperlipidemia, unspecified: Secondary | ICD-10-CM | POA: Diagnosis not present

## 2021-04-09 ENCOUNTER — Other Ambulatory Visit (HOSPITAL_COMMUNITY): Payer: Self-pay

## 2021-04-09 MED ORDER — ALPRAZOLAM 0.25 MG PO TABS
0.2500 mg | ORAL_TABLET | Freq: Two times a day (BID) | ORAL | 0 refills | Status: AC | PRN
  Filled 2021-04-09: qty 15, 30d supply, fill #0

## 2021-04-24 ENCOUNTER — Other Ambulatory Visit (HOSPITAL_COMMUNITY): Payer: Self-pay

## 2021-04-24 MED ORDER — CEPHALEXIN 500 MG PO CAPS
500.0000 mg | ORAL_CAPSULE | Freq: Two times a day (BID) | ORAL | 0 refills | Status: DC
  Filled 2021-04-24: qty 14, 7d supply, fill #0

## 2021-04-28 DIAGNOSIS — G4733 Obstructive sleep apnea (adult) (pediatric): Secondary | ICD-10-CM | POA: Diagnosis not present

## 2021-05-01 ENCOUNTER — Other Ambulatory Visit (HOSPITAL_COMMUNITY): Payer: Self-pay

## 2021-05-02 ENCOUNTER — Encounter (HOSPITAL_BASED_OUTPATIENT_CLINIC_OR_DEPARTMENT_OTHER): Payer: Self-pay | Admitting: Obstetrics & Gynecology

## 2021-05-02 ENCOUNTER — Other Ambulatory Visit (HOSPITAL_COMMUNITY): Payer: Self-pay

## 2021-05-20 DIAGNOSIS — E785 Hyperlipidemia, unspecified: Secondary | ICD-10-CM | POA: Diagnosis not present

## 2021-05-20 DIAGNOSIS — Z6834 Body mass index (BMI) 34.0-34.9, adult: Secondary | ICD-10-CM | POA: Diagnosis not present

## 2021-05-20 DIAGNOSIS — I10 Essential (primary) hypertension: Secondary | ICD-10-CM | POA: Diagnosis not present

## 2021-05-28 ENCOUNTER — Other Ambulatory Visit: Payer: Self-pay | Admitting: Obstetrics & Gynecology

## 2021-05-28 DIAGNOSIS — Z1231 Encounter for screening mammogram for malignant neoplasm of breast: Secondary | ICD-10-CM

## 2021-06-01 ENCOUNTER — Telehealth: Payer: 59 | Admitting: Family

## 2021-06-01 DIAGNOSIS — R42 Dizziness and giddiness: Secondary | ICD-10-CM

## 2021-06-01 MED ORDER — ONDANSETRON HCL 4 MG PO TABS: 4.0000 mg | ORAL_TABLET | Freq: Three times a day (TID) | ORAL | 0 refills | Status: AC | PRN

## 2021-06-01 MED ORDER — MECLIZINE HCL 50 MG PO TABS: 50.0000 mg | ORAL_TABLET | Freq: Three times a day (TID) | ORAL | 0 refills | Status: AC | PRN

## 2021-06-01 NOTE — Patient Instructions (Signed)
Vertigo Vertigo is the feeling that you or your surroundings are moving when they are not. This feeling can come and go at any time. Vertigo often goes away on its own. Vertigo can be dangerous if it occurs while you are doing something that could endanger yourself or others, such as driving or operating machinery. Your health care provider will do tests to try to determine the cause of your vertigo. Tests will also help your health care provider decide how best to treat your condition. Follow these instructions at home: Eating and drinking     Dehydration can make vertigo worse. Drink enough fluid to keep your urine pale yellow. Do not drink alcohol. Activity Return to your normal activities as told by your health care provider. Ask your health care provider what activities are safe for you. In the morning, first sit up on the side of the bed. When you feel okay, stand slowly while you hold onto something until you know that your balance is fine. Move slowly. Avoid sudden body or head movements or certain positions, as told by your health care provider. If you have trouble walking or keeping your balance, try using a cane for stability. If you feel dizzy or unstable, sit down right away. Avoid doing any tasks that would cause danger to you or others if vertigo occurs. Avoid bending down if you feel dizzy. Place items in your home so that they are easy for you to reach without bending or leaning over. Do not drive or use machinery if you feel dizzy. General instructions Take over-the-counter and prescription medicines only as told by your health care provider. Keep all follow-up visits. This is important. Contact a health care provider if: Your medicines do not relieve your vertigo or they make it worse. Your condition gets worse or you develop new symptoms. You have a fever. You develop nausea or vomiting, or if nausea gets worse. Your family or friends notice any behavioral changes. You  have numbness or a prickling and tingling sensation in part of your body. Get help right away if you: Are always dizzy or you faint. Develop severe headaches. Develop a stiff neck. Develop sensitivity to light. Have difficulty moving or speaking. Have weakness in your hands, arms, or legs. Have changes in your hearing or vision. These symptoms may represent a serious problem that is an emergency. Do not wait to see if the symptoms will go away. Get medical help right away. Call your local emergency services (911 in the [place] Do not drive yourself to the hospital. Summary Vertigo is the feeling that you or your surroundings are moving when they are not. Your health care provider will do tests to try to determine the cause of your vertigo. Follow instructions for home care. You may be told to avoid certain tasks, positions, or movements. Contact a health care provider if your medicines do not relieve your symptoms, or if you have a fever, nausea, vomiting, or changes in behavior. Get help right away if you have severe headaches or difficulty speaking, or you develop hearing or vision problems. This information is not intended to replace advice given to you by your health care provider. Make sure you discuss any questions you have with your health care provider. Document Revised: 11/22/2019 Document Reviewed: 11/22/2019 Elsevier Patient Education  2023 Elsevier Inc.  

## 2021-06-01 NOTE — Progress Notes (Signed)
Virtual Visit Consent   Raymond, you are scheduled for a virtual visit with a [facility] provider today. Just as with appointments in the office, your consent must be obtained to participate. Your consent will be active for this visit and any virtual visit you may have with one of our providers in the next 365 days. If you have a MyChart account, a copy of this consent can be sent to you electronically.  As this is a virtual visit, video technology does not allow for your provider to perform a traditional examination. This may limit your provider's ability to fully assess your condition. If your provider identifies any concerns that need to be evaluated in person or the need to arrange testing (such as labs, EKG, etc.), we will make arrangements to do so. Although advances in technology are sophisticated, we cannot ensure that it will always work on either your end or our end. If the connection with a video visit is poor, the visit may have to be switched to a telephone visit. With either a video or telephone visit, we are not always able to ensure that we have a secure connection.  By engaging in this virtual visit, you consent to the provision of healthcare and authorize for your insurance to be billed (if applicable) for the services provided during this visit. Depending on your insurance coverage, you may receive a charge related to this service.  I need to obtain your verbal consent now. Are you willing to proceed with your visit today? Alynna Hargrove Solanki has provided verbal consent on 06/01/2021 for a virtual visit (video or telephone). Evelina Dun, FNP  Date: 06/01/2021 9:46 AM  Virtual Visit via Video Note   I, Evelina Dun, connected with  Oreatha Fabry Syme  ([id number], [birth year]) on 06/01/21 at  9:30 AM EDT by a video-enabled telemedicine application and verified that I am speaking with the correct person using two identifiers.  Location: Patient: Virtual Visit Location  Patient: Home Provider: Virtual Visit Location Provider: Home Office   I discussed the limitations of evaluation and management by telemedicine and the availability of in person appointments. The patient expressed understanding and agreed to proceed.    History of Present Illness: NEAH SPORRER is a 66 y.o. who identifies as a female who was assigned female at birth, and is being seen today for vertigo that started Friday. She took antivert that slightly helped. Worse with bending and fast position changes.   HPI: Dizziness This is a new problem. The current episode started in the past 7 days. The problem has been unchanged. Associated symptoms include headaches, nausea and vertigo. Pertinent negatives include no arthralgias, chills, congestion, coughing, fatigue, fever, myalgias or vomiting. The symptoms are aggravated by twisting and bending. She has tried lying down for the symptoms. The treatment provided mild relief.   Problems:  Patient Active Problem List   Diagnosis Date Noted   Gastritis 12/16/2017   Hyperlipemia 12/15/2017   GERD (gastroesophageal reflux disease) 12/15/2017   CKD (chronic kidney disease), stage III ([facility]) 12/15/2017   Multiple renal cysts 12/15/2017   Angioedema 12/05/2017   Essential hypertension    Family history of breast cancer in female 04/12/2015   Family history of ovarian cancer 04/12/2015   Cervical radiculopathy 04/06/2014   Carpal tunnel syndrome of left wrist 04/06/2014   Airway compromise 06/28/2013   Headache(784.0) 06/02/2013   ABNORMALITY OF GAIT 03/15/2009    Allergies:  Allergies  Allergen Reactions   Ace Inhibitors  Angioedema    Chlorthalidone     Angioedema    Crab [Shellfish Allergy] Anaphylaxis   Edarbi [Azilsartan] Hypertension   Ivp Dye [Iodinated Contrast Media] Anaphylaxis   Losartan     Angioedema    Nizatidine Anaphylaxis    Tolerates famotidine and cimetidine   Bidil [Isosorb Dinitrate-Hydralazine]     Hand  swelling   Amlodipine Besylate Other (See Comments)   Atorvastatin Other (See Comments)   Dilaudid [Hydromorphone Hcl] Nausea And Vomiting    Pt can tolerate morphine   Hydrocodone-Acetaminophen Nausea And Vomiting   Oxycodone-Acetaminophen Nausea And Vomiting   Medications:  Current Outpatient Medications:    meclizine (ANTIVERT) 50 MG tablet, Take 1 tablet (50 mg total) by mouth 3 (three) times daily as needed., Disp: 30 tablet, Rfl: 0   ondansetron (ZOFRAN) 4 MG tablet, Take 1 tablet (4 mg total) by mouth every 8 (eight) hours as needed for nausea or vomiting., Disp: 20 tablet, Rfl: 0   albuterol (VENTOLIN HFA) 108 (90 Base) MCG/ACT inhaler, Inhale 2 puffs by mouth every 6 hours as needed, Disp: 18 g, Rfl: 2   ALPRAZolam (XANAX) 0.25 MG tablet, Take 1 tablet (0.25 mg total) by mouth 2 (two) times daily as needed for flying in planes, Disp: 15 tablet, Rfl: 0   Blood Pressure Monitoring (OMRON 3 SERIES BP MONITOR) DEVI, Use as directed., Disp: 1 each, Rfl: 0   Calcium-Magnesium-Vitamin D 600-40-500 MG-MG-UNIT TB24, Take 3 tablets by mouth daily., Disp: , Rfl:    cloNIDine (CATAPRES - DOSED IN MG/24 HR) 0.2 mg/24hr patch, Place 1 patch (0.2 mg total) onto the skin weekly, Disp: 12 patch, Rfl: 3   diclofenac sodium (VOLTAREN) 1 % GEL, Apply 2 g topically as needed. To affected joint, Disp: , Rfl: 5   diphenhydrAMINE (BENADRYL) 25 mg capsule, Take 25 mg by mouth every 6 (six) hours as needed., Disp: , Rfl:    diphenhydrAMINE-zinc acetate (BENADRYL EXTRA STRENGTH) cream, Apply 1 application topically 3 (three) times daily as needed for itching (Apply on hands)., Disp: 28.4 g, Rfl: 0   EPINEPHrine 0.3 mg/0.3 mL IJ SOAJ injection, Inject 0.3 mg into the muscle as directed as needed, Disp: 2 each, Rfl: 1   esomeprazole (NEXIUM) 40 MG capsule, Take 1 capsule (40 mg total) by mouth 2 (two) times daily., Disp: 180 capsule, Rfl: 3   famotidine (PEPCID) 20 MG tablet, Take 1 tablet (20 mg total) by mouth 2  (two) times daily., Disp: 14 tablet, Rfl: 0   furosemide (LASIX) 20 MG tablet, , Disp: , Rfl:    metoprolol tartrate (LOPRESSOR) 50 MG tablet, Take 1 tablet (50 mg) by mouth in the morning and 0.5 tablet (25 mg) at bedtime., Disp: 135 tablet, Rfl: 3   nitroGLYCERIN (NITROSTAT) 0.4 MG SL tablet, Place 1 tablet (0.4 mg total) under the tongue every 5 (five) minutes as needed for chest pain., Disp: 30 tablet, Rfl: 12   spironolactone (ALDACTONE) 100 MG tablet, Take 1 tablet (100 mg total) by mouth daily., Disp: 90 tablet, Rfl: 3  Observations/Objective: Patient is well-developed, well-nourished in no acute distress.  Resting comfortably  at home.  Head is normocephalic, atraumatic.  No labored breathing.  Speech is clear and coherent with logical content.  Patient is alert and oriented at baseline.    Assessment and Plan: 1. Vertigo - meclizine (ANTIVERT) 50 MG tablet; Take 1 tablet (50 mg total) by mouth 3 (three) times daily as needed.  Dispense: 30 tablet; Refill: 0 - ondansetron (  ZOFRAN) 4 MG tablet; Take 1 tablet (4 mg total) by mouth every 8 (eight) hours as needed for nausea or vomiting.  Dispense: 20 tablet; Refill: 0  Fall precautions  Epley exercises discussed  Continue vertigo TID prn Rest Follow up if symptoms worsen or do not improve   Follow Up Instructions: I discussed the assessment and treatment plan with the patient. The patient was provided an opportunity to ask questions and all were answered. The patient agreed with the plan and demonstrated an understanding of the instructions.  A copy of instructions were sent to the patient via MyChart unless otherwise noted below.     The patient was advised to call back or seek an in-person evaluation if the symptoms worsen or if the condition fails to improve as anticipated.  Time:  I spent 9 minutes with the patient via telehealth technology discussing the above problems/concerns.    Evelina Dun, FNP

## 2021-06-05 ENCOUNTER — Ambulatory Visit
Admission: RE | Admit: 2021-06-05 | Discharge: 2021-06-05 | Disposition: A | Discharge status: Home / Self Care | Payer: 59 | Source: Ambulatory Visit | Attending: Obstetrics & Gynecology | Admitting: Obstetrics & Gynecology

## 2021-06-05 DIAGNOSIS — Z1231 Encounter for screening mammogram for malignant neoplasm of breast: Secondary | ICD-10-CM

## 2021-06-18 ENCOUNTER — Other Ambulatory Visit (HOSPITAL_COMMUNITY): Payer: Self-pay

## 2021-06-18 MED ORDER — DICLOFENAC SODIUM 1 % EX GEL
1.0000 "application " | Freq: Three times a day (TID) | CUTANEOUS | 5 refills | Status: AC
  Filled 2021-06-18: qty 100, 33d supply, fill #0
  Filled 2021-06-18: qty 100, 30d supply, fill #0
  Filled 2022-04-21: qty 100, 33d supply, fill #1

## 2021-06-18 MED ORDER — ESOMEPRAZOLE MAGNESIUM 40 MG PO CPDR
40.0000 mg | DELAYED_RELEASE_CAPSULE | Freq: Two times a day (BID) | ORAL | 3 refills | Status: DC
  Filled 2021-06-18: qty 180, 90d supply, fill #0
  Filled 2021-10-09: qty 180, 90d supply, fill #1
  Filled 2022-01-28: qty 180, 90d supply, fill #2
  Filled 2022-04-21: qty 180, 90d supply, fill #3

## 2021-06-24 DIAGNOSIS — I10 Essential (primary) hypertension: Secondary | ICD-10-CM | POA: Diagnosis not present

## 2021-06-24 DIAGNOSIS — E785 Hyperlipidemia, unspecified: Secondary | ICD-10-CM | POA: Diagnosis not present

## 2021-06-24 DIAGNOSIS — Z6835 Body mass index (BMI) 35.0-35.9, adult: Secondary | ICD-10-CM | POA: Diagnosis not present

## 2021-06-24 DIAGNOSIS — E6609 Other obesity due to excess calories: Secondary | ICD-10-CM | POA: Diagnosis not present

## 2021-07-24 DIAGNOSIS — E785 Hyperlipidemia, unspecified: Secondary | ICD-10-CM | POA: Diagnosis not present

## 2021-07-24 DIAGNOSIS — Q6102 Congenital multiple renal cysts: Secondary | ICD-10-CM | POA: Diagnosis not present

## 2021-07-24 DIAGNOSIS — I129 Hypertensive chronic kidney disease with stage 1 through stage 4 chronic kidney disease, or unspecified chronic kidney disease: Secondary | ICD-10-CM | POA: Diagnosis not present

## 2021-07-24 DIAGNOSIS — N1831 Chronic kidney disease, stage 3a: Secondary | ICD-10-CM | POA: Diagnosis not present

## 2021-09-18 ENCOUNTER — Other Ambulatory Visit (HOSPITAL_COMMUNITY): Payer: Self-pay

## 2021-09-18 MED ORDER — CEPHALEXIN 500 MG PO CAPS
500.0000 mg | ORAL_CAPSULE | Freq: Two times a day (BID) | ORAL | 0 refills | Status: DC
  Filled 2021-09-18: qty 14, 7d supply, fill #0

## 2021-10-09 ENCOUNTER — Other Ambulatory Visit (HOSPITAL_COMMUNITY): Payer: Self-pay

## 2021-10-10 ENCOUNTER — Other Ambulatory Visit (HOSPITAL_COMMUNITY): Payer: Self-pay

## 2021-10-10 MED ORDER — INFLUENZA VAC SPLIT QUAD 0.5 ML IM SUSY
0.5000 mL | PREFILLED_SYRINGE | INTRAMUSCULAR | 0 refills | Status: DC
  Filled 2021-10-10: qty 0.5, 1d supply, fill #0

## 2021-10-23 DIAGNOSIS — G4733 Obstructive sleep apnea (adult) (pediatric): Secondary | ICD-10-CM | POA: Diagnosis not present

## 2021-10-24 DIAGNOSIS — Z6836 Body mass index (BMI) 36.0-36.9, adult: Secondary | ICD-10-CM | POA: Diagnosis not present

## 2021-10-24 DIAGNOSIS — K219 Gastro-esophageal reflux disease without esophagitis: Secondary | ICD-10-CM | POA: Diagnosis not present

## 2021-10-24 DIAGNOSIS — G4733 Obstructive sleep apnea (adult) (pediatric): Secondary | ICD-10-CM | POA: Diagnosis not present

## 2021-10-24 DIAGNOSIS — E669 Obesity, unspecified: Secondary | ICD-10-CM | POA: Diagnosis not present

## 2021-10-24 DIAGNOSIS — I10 Essential (primary) hypertension: Secondary | ICD-10-CM | POA: Diagnosis not present

## 2021-10-24 DIAGNOSIS — E559 Vitamin D deficiency, unspecified: Secondary | ICD-10-CM | POA: Diagnosis not present

## 2021-10-24 DIAGNOSIS — E785 Hyperlipidemia, unspecified: Secondary | ICD-10-CM | POA: Diagnosis not present

## 2021-11-21 ENCOUNTER — Encounter (HOSPITAL_BASED_OUTPATIENT_CLINIC_OR_DEPARTMENT_OTHER): Payer: Self-pay | Admitting: Obstetrics & Gynecology

## 2021-11-21 ENCOUNTER — Ambulatory Visit (INDEPENDENT_AMBULATORY_CARE_PROVIDER_SITE_OTHER): Payer: 59 | Admitting: Obstetrics & Gynecology

## 2021-11-21 ENCOUNTER — Other Ambulatory Visit (HOSPITAL_COMMUNITY)
Admission: RE | Admit: 2021-11-21 | Discharge: 2021-11-21 | Disposition: A | Discharge status: Home / Self Care | Payer: 59 | Source: Ambulatory Visit | Attending: Obstetrics & Gynecology | Admitting: Obstetrics & Gynecology

## 2021-11-21 VITALS — BP 130/69 | HR 73 | Ht 68.0 in | Wt 244.4 lb

## 2021-11-21 DIAGNOSIS — I10 Essential (primary) hypertension: Secondary | ICD-10-CM

## 2021-11-21 DIAGNOSIS — Z124 Encounter for screening for malignant neoplasm of cervix: Secondary | ICD-10-CM

## 2021-11-21 DIAGNOSIS — T783XXD Angioneurotic edema, subsequent encounter: Secondary | ICD-10-CM

## 2021-11-21 DIAGNOSIS — Z780 Asymptomatic menopausal state: Secondary | ICD-10-CM | POA: Diagnosis not present

## 2021-11-21 DIAGNOSIS — Z8041 Family history of malignant neoplasm of ovary: Secondary | ICD-10-CM

## 2021-11-21 DIAGNOSIS — Z01419 Encounter for gynecological examination (general) (routine) without abnormal findings: Secondary | ICD-10-CM

## 2021-11-21 NOTE — Progress Notes (Signed)
66 y.o. [id number] Married [place] American female here for AEX.  She had considered retiring but feeling really well and has decided to keep on working.  She is going to work part-time.    Denies vaginal bleeding.  Patient's last menstrual period was 07/05/2009.          Sexually active: No.  H/O STD:  no  Health Maintenance: PCP:  Dr. Nancy Fetter.  Saw Dr. Inda Merlin early this year.  Will see new PCP in early 2024.   Vaccines are up to date:  hasn't done shingrix Colonoscopy:  08/2017, follow up 10 years MMG:  06/2021 BMD:  2020, normal Last pap smear:  2020.   H/o abnormal pap smear:   Remote hx    reports that she has never smoked. She has never used smokeless tobacco. She reports that she does not drink alcohol and does not use drugs.  Past Medical History:  Diagnosis Date   Abnormal Pap smear of cervix    Anemia    as a child   Anxiety    Bulging lumbar disc    Congestive heart failure (CHF) (HCC)    GERD (gastroesophageal reflux disease)    Hypertension    Lactose intolerance    MVP (mitral valve prolapse)    Pyelonephritis    Renal disorder    Sleep apnea    Spinal stenosis     Past Surgical History:  Procedure Laterality Date   ankle fracture  10/06/2007   bilateral-right was surgically repaired   ANKLE SURGERY  10/05/2008   screws removed   ANKLE SURGERY  02/05/2010   removal of all hardware right ankle   APPENDECTOMY     arm surgery     plate in left arm   BREAST BIOPSY Right    CHOLECYSTECTOMY     DILATION AND CURETTAGE OF UTERUS      Current Outpatient Medications  Medication Sig Dispense Refill   albuterol (VENTOLIN HFA) 108 (90 Base) MCG/ACT inhaler Inhale 2 puffs by mouth every 6 hours as needed 18 g 2   ALPRAZolam (XANAX) 0.25 MG tablet Take 1 tablet (0.25 mg total) by mouth 2 (two) times daily as needed for flying in planes 15 tablet 0   Blood Pressure Monitoring (OMRON 3 SERIES BP MONITOR) DEVI Use as directed. 1 each 0   Calcium-Magnesium-Vitamin  D 600-40-500 MG-MG-UNIT TB24 Take 3 tablets by mouth daily.     cloNIDine (CATAPRES - DOSED IN MG/24 HR) 0.2 mg/24hr patch Place 1 patch (0.2 mg total) onto the skin weekly 12 patch 3   diclofenac sodium (VOLTAREN) 1 % GEL Apply 2 g topically as needed. To affected joint  5   diclofenac Sodium (VOLTAREN) 1 % GEL Apply 1 gram application as directed topically 3 (three) times daily to affected joint 100 g 5   diphenhydrAMINE (BENADRYL) 25 mg capsule Take 25 mg by mouth every 6 (six) hours as needed.     diphenhydrAMINE-zinc acetate (BENADRYL EXTRA STRENGTH) cream Apply 1 application topically 3 (three) times daily as needed for itching (Apply on hands). 28.4 g 0   EPINEPHrine 0.3 mg/0.3 mL IJ SOAJ injection Inject 0.3 mg into the muscle as directed as needed 2 each 1   esomeprazole (NEXIUM) 40 MG capsule Take 1 capsule (40 mg total) by mouth 2 (two) times daily. 180 capsule 3   famotidine (PEPCID) 20 MG tablet Take 1 tablet (20 mg total) by mouth 2 (two) times daily. 14 tablet 0  furosemide (LASIX) 20 MG tablet      influenza vac split quadrivalent PF (FLUARIX) 0.5 ML injection Inject 0.5 mLs into the muscle. 0.5 mL 0   meclizine (ANTIVERT) 50 MG tablet Take 1 tablet (50 mg total) by mouth 3 (three) times daily as needed. 30 tablet 0   metoprolol tartrate (LOPRESSOR) 50 MG tablet Take 1 tablet (50 mg) by mouth in the morning and 0.5 tablet (25 mg) at bedtime. 135 tablet 3   nitroGLYCERIN (NITROSTAT) 0.4 MG SL tablet Place 1 tablet (0.4 mg total) under the tongue every 5 (five) minutes as needed for chest pain. 30 tablet 12   ondansetron (ZOFRAN) 4 MG tablet Take 1 tablet (4 mg total) by mouth every 8 (eight) hours as needed for nausea or vomiting. 20 tablet 0   spironolactone (ALDACTONE) 100 MG tablet Take 1 tablet (100 mg total) by mouth daily. 90 tablet 3   No current facility-administered medications for this visit.    Family History  Problem Relation Age of Onset   Diabetes Maternal  Grandmother    Stroke Maternal Grandmother    Kidney disease Maternal Grandmother    Ovarian cancer Paternal Grandmother        dx. late 30s-early 40s   Prostate cancer Paternal Grandfather    Ovarian cancer Paternal Aunt        (x3) paternal aunts dx. ages 53s-late 70s   Breast cancer Maternal Aunt        dx. 32s   Hypertension Mother    Heart disease Mother    Goiter Mother    Other Mother        breast lumpectomy in early 40s, was in the hospital for 5 days after   Heart disease Father    Prostate cancer Father 40       s/p orchiectomy   Stroke Father    Ulcers Brother    Other Brother        elevated PSA level w/ surveillance   Colon polyps Brother        less than 10   Pyelonephritis Daughter    Breast cancer Sister 7       left breast ca - poorly differentiated carcinoma with squamous metaplasia; triple neg; has since had BL mastectomies   Colon polyps Sister        less than 10   Congestive Heart Failure Sister    Prostate cancer Paternal Uncle        (x5) paternal uncles dx. prostate cancer in their 13s   Prostate cancer Maternal Grandfather        d. late 29s with mets to colon   Other Other 39       NOS benign brain tumor; +fluid   Prostate cancer Cousin 70       paternal 1st cousin    Prostate cancer Cousin        paternal 1st cousin, once-removed dx. 3s   Prostate cancer Cousin        (x2) paternal 1st cousins, once-removed, dx. early 1s   Lung cancer Brother 49       paternal half-brother; smoker   Uterine cancer Maternal Aunt        dx. 40s   Breast cancer Maternal Aunt        dx. bilateral breast cancers - 74s, 70s   Liver cancer Maternal Aunt        +hepatitis C; d. 60s   Congestive Heart Failure Maternal Uncle 68  Stroke Maternal Uncle        d. 42s; (x2 maternal uncles)   Cancer Cousin 47       sinus cancer dx. 64s (maternal 1st cousin)   Lung cancer Cousin        maternal 1st cousin dx. 60s/smoker; maternal 1st cousin   Pancreatic cancer  Cousin        maternal 1st cousin dx. late 62s   Prostate cancer Cousin        maternal 1st cousin dx. 33   Breast cancer Cousin        (x4) maternal 1st cousins dx. late 2s, 22s    Review of Systems  Constitutional: Negative.   Genitourinary: Negative.   All other systems reviewed and are negative.   Exam:   BP 130/69 (BP Location: Right Arm, Patient Position: Sitting, Cuff Size: Large)   Pulse 73   Ht '5\' 8"'$  (1.727 m) Comment: reported  Wt 244 lb 6.4 oz (110.9 kg)   LMP 07/05/2009   BMI 37.16 kg/m   Height: '5\' 8"'$  (172.7 cm) (reported)  General appearance: alert, cooperative and appears stated age Breasts: normal appearance, no masses or tenderness Abdomen: soft, non-tender; bowel sounds normal; no masses,  no organomegaly Lymph nodes: Cervical, supraclavicular, and axillary nodes normal.  No abnormal inguinal nodes palpated Neurologic: Grossly normal  Pelvic: External genitalia:  no lesions              Urethra:  normal appearing urethra with no masses, tenderness or lesions              Bartholins and Skenes: normal                 Vagina: normal appearing vagina with atrophic changes and no discharge, no lesions              Cervix: no lesions              Pap taken: Yes.   Bimanual Exam:  Uterus:  normal size, contour, position, consistency, mobility, non-tender              Adnexa: normal adnexa and no mass, fullness, tenderness               Rectovaginal: Confirms               Anus:  normal sphincter tone, no lesions  Chaperone, Octaviano Batty, CMA, was present for exam.  Assessment/Plan: 1. Well woman exam with routine gynecological exam - Pap smear obtained today - Mammogram 06/2021 - Colonoscopy 2019 - Bone mineral density 2020, normal - lab work done with PCP, Dr. Inda Merlin - vaccines reviewed/updated  2. Postmenopause - not on HRT  3. Cervical cancer screening - Cytology - PAP( [place])  4. Family history of ovarian cancer  5. Angioedema,  subsequent encounter - this was after Covid vaccination  6. Essential hypertension

## 2021-11-24 DIAGNOSIS — K760 Fatty (change of) liver, not elsewhere classified: Secondary | ICD-10-CM | POA: Diagnosis not present

## 2021-11-24 DIAGNOSIS — I10 Essential (primary) hypertension: Secondary | ICD-10-CM | POA: Diagnosis not present

## 2021-11-24 DIAGNOSIS — Z6836 Body mass index (BMI) 36.0-36.9, adult: Secondary | ICD-10-CM | POA: Diagnosis not present

## 2021-11-24 DIAGNOSIS — G4733 Obstructive sleep apnea (adult) (pediatric): Secondary | ICD-10-CM | POA: Diagnosis not present

## 2021-11-24 DIAGNOSIS — E559 Vitamin D deficiency, unspecified: Secondary | ICD-10-CM | POA: Diagnosis not present

## 2021-11-24 DIAGNOSIS — Z91013 Allergy to seafood: Secondary | ICD-10-CM | POA: Diagnosis not present

## 2021-11-24 DIAGNOSIS — K219 Gastro-esophageal reflux disease without esophagitis: Secondary | ICD-10-CM | POA: Diagnosis not present

## 2021-11-24 DIAGNOSIS — E785 Hyperlipidemia, unspecified: Secondary | ICD-10-CM | POA: Diagnosis not present

## 2021-11-26 LAB — CYTOLOGY - PAP
Comment: NEGATIVE
Diagnosis: NEGATIVE
Diagnosis: REACTIVE
High risk HPV: NEGATIVE

## 2021-12-04 DIAGNOSIS — M2550 Pain in unspecified joint: Secondary | ICD-10-CM | POA: Diagnosis not present

## 2021-12-04 DIAGNOSIS — I129 Hypertensive chronic kidney disease with stage 1 through stage 4 chronic kidney disease, or unspecified chronic kidney disease: Secondary | ICD-10-CM | POA: Diagnosis not present

## 2021-12-04 DIAGNOSIS — R0989 Other specified symptoms and signs involving the circulatory and respiratory systems: Secondary | ICD-10-CM | POA: Diagnosis not present

## 2021-12-04 DIAGNOSIS — E785 Hyperlipidemia, unspecified: Secondary | ICD-10-CM | POA: Diagnosis not present

## 2021-12-04 DIAGNOSIS — N1831 Chronic kidney disease, stage 3a: Secondary | ICD-10-CM | POA: Diagnosis not present

## 2021-12-19 ENCOUNTER — Other Ambulatory Visit (HOSPITAL_COMMUNITY): Payer: Self-pay

## 2021-12-19 MED ORDER — REPATHA SURECLICK 140 MG/ML ~~LOC~~ SOAJ
140.0000 mg | SUBCUTANEOUS | 3 refills | Status: DC
  Filled 2021-12-19 – 2022-02-24 (×5): qty 2, 28d supply, fill #0
  Filled 2022-03-25 – 2022-03-27 (×2): qty 2, 28d supply, fill #1
  Filled 2022-04-21: qty 2, 28d supply, fill #2

## 2021-12-24 DIAGNOSIS — G4733 Obstructive sleep apnea (adult) (pediatric): Secondary | ICD-10-CM | POA: Diagnosis not present

## 2021-12-30 ENCOUNTER — Other Ambulatory Visit (HOSPITAL_COMMUNITY): Payer: Self-pay

## 2022-01-02 ENCOUNTER — Other Ambulatory Visit (HOSPITAL_COMMUNITY): Payer: Self-pay

## 2022-01-12 DIAGNOSIS — G4733 Obstructive sleep apnea (adult) (pediatric): Secondary | ICD-10-CM | POA: Diagnosis not present

## 2022-01-12 DIAGNOSIS — E785 Hyperlipidemia, unspecified: Secondary | ICD-10-CM | POA: Diagnosis not present

## 2022-01-12 DIAGNOSIS — Z6836 Body mass index (BMI) 36.0-36.9, adult: Secondary | ICD-10-CM | POA: Diagnosis not present

## 2022-01-12 DIAGNOSIS — N1831 Chronic kidney disease, stage 3a: Secondary | ICD-10-CM | POA: Diagnosis not present

## 2022-01-12 DIAGNOSIS — I129 Hypertensive chronic kidney disease with stage 1 through stage 4 chronic kidney disease, or unspecified chronic kidney disease: Secondary | ICD-10-CM | POA: Diagnosis not present

## 2022-01-22 ENCOUNTER — Other Ambulatory Visit (HOSPITAL_COMMUNITY): Payer: Self-pay

## 2022-01-23 ENCOUNTER — Other Ambulatory Visit (HOSPITAL_COMMUNITY): Payer: Self-pay

## 2022-01-27 ENCOUNTER — Other Ambulatory Visit (HOSPITAL_COMMUNITY): Payer: Self-pay

## 2022-01-27 DIAGNOSIS — G4733 Obstructive sleep apnea (adult) (pediatric): Secondary | ICD-10-CM | POA: Diagnosis not present

## 2022-01-28 ENCOUNTER — Other Ambulatory Visit: Payer: Self-pay

## 2022-01-28 ENCOUNTER — Other Ambulatory Visit (HOSPITAL_COMMUNITY): Payer: Self-pay

## 2022-01-30 ENCOUNTER — Other Ambulatory Visit (HOSPITAL_COMMUNITY): Payer: Self-pay

## 2022-01-30 MED ORDER — SPIRONOLACTONE 100 MG PO TABS
100.0000 mg | ORAL_TABLET | Freq: Every day | ORAL | 0 refills | Status: DC
  Filled 2022-01-30 – 2022-05-04 (×2): qty 7, 7d supply, fill #0

## 2022-02-06 ENCOUNTER — Other Ambulatory Visit (HOSPITAL_COMMUNITY): Payer: Self-pay

## 2022-02-09 ENCOUNTER — Other Ambulatory Visit (HOSPITAL_COMMUNITY): Payer: Self-pay

## 2022-02-10 ENCOUNTER — Other Ambulatory Visit (HOSPITAL_COMMUNITY): Payer: Self-pay

## 2022-02-10 DIAGNOSIS — I509 Heart failure, unspecified: Secondary | ICD-10-CM | POA: Diagnosis not present

## 2022-02-10 DIAGNOSIS — E559 Vitamin D deficiency, unspecified: Secondary | ICD-10-CM | POA: Diagnosis not present

## 2022-02-10 DIAGNOSIS — Z0000 Encounter for general adult medical examination without abnormal findings: Secondary | ICD-10-CM | POA: Diagnosis not present

## 2022-02-10 DIAGNOSIS — J45909 Unspecified asthma, uncomplicated: Secondary | ICD-10-CM | POA: Diagnosis not present

## 2022-02-10 DIAGNOSIS — E785 Hyperlipidemia, unspecified: Secondary | ICD-10-CM | POA: Diagnosis not present

## 2022-02-10 DIAGNOSIS — G4733 Obstructive sleep apnea (adult) (pediatric): Secondary | ICD-10-CM | POA: Diagnosis not present

## 2022-02-10 DIAGNOSIS — F419 Anxiety disorder, unspecified: Secondary | ICD-10-CM | POA: Diagnosis not present

## 2022-02-10 DIAGNOSIS — N1831 Chronic kidney disease, stage 3a: Secondary | ICD-10-CM | POA: Diagnosis not present

## 2022-02-10 DIAGNOSIS — N2581 Secondary hyperparathyroidism of renal origin: Secondary | ICD-10-CM | POA: Diagnosis not present

## 2022-02-10 DIAGNOSIS — I129 Hypertensive chronic kidney disease with stage 1 through stage 4 chronic kidney disease, or unspecified chronic kidney disease: Secondary | ICD-10-CM | POA: Diagnosis not present

## 2022-02-10 MED ORDER — ALBUTEROL SULFATE HFA 108 (90 BASE) MCG/ACT IN AERS
2.0000 | INHALATION_SPRAY | Freq: Four times a day (QID) | RESPIRATORY_TRACT | 0 refills | Status: DC
  Filled 2022-02-10: qty 13.4, 50d supply, fill #0

## 2022-02-10 MED ORDER — CLONIDINE 0.2 MG/24HR TD PTWK
0.2000 mg | MEDICATED_PATCH | TRANSDERMAL | 3 refills | Status: DC
  Filled 2022-02-10: qty 12, 84d supply, fill #0
  Filled 2022-05-04: qty 12, 84d supply, fill #1
  Filled 2022-08-24: qty 4, 28d supply, fill #2
  Filled 2022-08-25: qty 8, 56d supply, fill #2
  Filled 2022-11-13: qty 12, 84d supply, fill #3

## 2022-02-10 MED ORDER — LORAZEPAM 0.5 MG PO TABS
0.5000 mg | ORAL_TABLET | Freq: Once | ORAL | 0 refills | Status: AC
  Filled 2022-02-10: qty 2, 1d supply, fill #0

## 2022-02-10 MED ORDER — SPIRONOLACTONE 100 MG PO TABS
100.0000 mg | ORAL_TABLET | Freq: Every day | ORAL | 3 refills | Status: DC
  Filled 2022-02-10: qty 90, 90d supply, fill #0
  Filled 2022-05-21: qty 90, 90d supply, fill #1
  Filled 2022-08-24: qty 90, 90d supply, fill #2
  Filled 2022-12-23: qty 90, 90d supply, fill #3

## 2022-02-23 ENCOUNTER — Other Ambulatory Visit (HOSPITAL_COMMUNITY): Payer: Self-pay

## 2022-02-24 ENCOUNTER — Other Ambulatory Visit (HOSPITAL_COMMUNITY): Payer: Self-pay

## 2022-02-27 DIAGNOSIS — G4733 Obstructive sleep apnea (adult) (pediatric): Secondary | ICD-10-CM | POA: Diagnosis not present

## 2022-03-26 ENCOUNTER — Other Ambulatory Visit: Payer: Self-pay

## 2022-03-26 ENCOUNTER — Other Ambulatory Visit (HOSPITAL_COMMUNITY): Payer: Self-pay

## 2022-03-27 ENCOUNTER — Other Ambulatory Visit (HOSPITAL_COMMUNITY): Payer: Self-pay

## 2022-03-28 DIAGNOSIS — G4733 Obstructive sleep apnea (adult) (pediatric): Secondary | ICD-10-CM | POA: Diagnosis not present

## 2022-04-21 ENCOUNTER — Other Ambulatory Visit (HOSPITAL_COMMUNITY): Payer: Self-pay

## 2022-04-22 ENCOUNTER — Other Ambulatory Visit (HOSPITAL_COMMUNITY): Payer: Self-pay

## 2022-04-22 ENCOUNTER — Other Ambulatory Visit: Payer: Self-pay

## 2022-04-22 MED ORDER — METOPROLOL TARTRATE 50 MG PO TABS
75.0000 mg | ORAL_TABLET | Freq: Every day | ORAL | 3 refills | Status: AC
  Filled 2022-04-22: qty 135, 90d supply, fill #0
  Filled 2022-07-21: qty 135, 90d supply, fill #1
  Filled 2023-03-03: qty 135, 90d supply, fill #2

## 2022-04-23 ENCOUNTER — Other Ambulatory Visit (HOSPITAL_COMMUNITY): Payer: Self-pay

## 2022-04-23 MED ORDER — ALBUTEROL SULFATE HFA 108 (90 BASE) MCG/ACT IN AERS
2.0000 | INHALATION_SPRAY | Freq: Four times a day (QID) | RESPIRATORY_TRACT | 0 refills | Status: DC | PRN
  Filled 2022-04-23: qty 13.4, 50d supply, fill #0

## 2022-04-26 ENCOUNTER — Encounter (HOSPITAL_BASED_OUTPATIENT_CLINIC_OR_DEPARTMENT_OTHER): Payer: Self-pay

## 2022-04-26 ENCOUNTER — Emergency Department (HOSPITAL_BASED_OUTPATIENT_CLINIC_OR_DEPARTMENT_OTHER)
Admission: EM | Admit: 2022-04-26 | Discharge: 2022-04-26 | Disposition: A | Discharge status: Home / Self Care | Payer: Commercial Managed Care - PPO | Attending: Emergency Medicine | Admitting: Emergency Medicine

## 2022-04-26 ENCOUNTER — Emergency Department (HOSPITAL_BASED_OUTPATIENT_CLINIC_OR_DEPARTMENT_OTHER): Payer: Commercial Managed Care - PPO

## 2022-04-26 ENCOUNTER — Other Ambulatory Visit: Payer: Self-pay

## 2022-04-26 DIAGNOSIS — R109 Unspecified abdominal pain: Secondary | ICD-10-CM | POA: Diagnosis present

## 2022-04-26 DIAGNOSIS — M545 Low back pain, unspecified: Secondary | ICD-10-CM | POA: Insufficient documentation

## 2022-04-26 DIAGNOSIS — N289 Disorder of kidney and ureter, unspecified: Secondary | ICD-10-CM | POA: Diagnosis not present

## 2022-04-26 DIAGNOSIS — M5459 Other low back pain: Secondary | ICD-10-CM | POA: Diagnosis not present

## 2022-04-26 LAB — CBC WITH DIFFERENTIAL/PLATELET
Abs Immature Granulocytes: 0.03 10*3/uL (ref 0.00–0.07)
Basophils Absolute: 0.1 10*3/uL (ref 0.0–0.1)
Basophils Relative: 1 %
Eosinophils Absolute: 0.5 10*3/uL (ref 0.0–0.5)
Eosinophils Relative: 4 %
HCT: 43 % (ref 36.0–46.0)
Hemoglobin: 14.7 g/dL (ref 12.0–15.0)
Immature Granulocytes: 0 %
Lymphocytes Relative: 33 %
Lymphs Abs: 4.2 10*3/uL — ABNORMAL HIGH (ref 0.7–4.0)
MCH: 29.4 pg (ref 26.0–34.0)
MCHC: 34.2 g/dL (ref 30.0–36.0)
MCV: 86 fL (ref 80.0–100.0)
Monocytes Absolute: 0.9 10*3/uL (ref 0.1–1.0)
Monocytes Relative: 7 %
Neutro Abs: 7.1 10*3/uL (ref 1.7–7.7)
Neutrophils Relative %: 55 %
Platelets: 280 10*3/uL (ref 150–400)
RBC: 5 MIL/uL (ref 3.87–5.11)
RDW: 13.8 % (ref 11.5–15.5)
WBC: 12.9 10*3/uL — ABNORMAL HIGH (ref 4.0–10.5)
nRBC: 0 % (ref 0.0–0.2)

## 2022-04-26 LAB — URINALYSIS, ROUTINE W REFLEX MICROSCOPIC
Bilirubin Urine: NEGATIVE
Glucose, UA: NEGATIVE mg/dL
Hgb urine dipstick: NEGATIVE
Ketones, ur: NEGATIVE mg/dL
Leukocytes,Ua: NEGATIVE
Nitrite: NEGATIVE
Protein, ur: NEGATIVE mg/dL
Specific Gravity, Urine: 1.015 (ref 1.005–1.030)
pH: 5 (ref 5.0–8.0)

## 2022-04-26 LAB — BASIC METABOLIC PANEL
Anion gap: 10 (ref 5–15)
BUN: 26 mg/dL — ABNORMAL HIGH (ref 8–23)
CO2: 23 mmol/L (ref 22–32)
Calcium: 10.8 mg/dL — ABNORMAL HIGH (ref 8.9–10.3)
Chloride: 105 mmol/L (ref 98–111)
Creatinine, Ser: 1.51 mg/dL — ABNORMAL HIGH (ref 0.44–1.00)
GFR, Estimated: 38 mL/min — ABNORMAL LOW (ref >60)
Glucose, Bld: 97 mg/dL (ref 70–99)
Potassium: 3.9 mmol/L (ref 3.5–5.1)
Sodium: 138 mmol/L (ref 135–145)

## 2022-04-26 NOTE — ED Notes (Signed)
Patient transported to CT 

## 2022-04-26 NOTE — ED Provider Notes (Signed)
[place] EMERGENCY DEPARTMENT AT [facility]  Provider Note  CSN: [id number] Arrival date & time: 04/26/22 0601  History Chief Complaint  Patient presents with   Flank Pain    [name] is a 67 y.o. female with remote history of renal stones and lumbar spinal stenosis reports about a week of moderate, aching R flank pain, radiating around to RLQ. Improved with standing. No N/V, dysuria, hematuria or fever.    Home Medications Prior to Admission medications   Medication Sig Start Date End Date Taking? Authorizing Provider  albuterol (VENTOLIN HFA) 108 (90 Base) MCG/ACT inhaler Inhale 2 puffs by mouth every 6 hours as needed 09/28/19   Marden Noble, MD  albuterol (VENTOLIN HFA) 108 (90 Base) MCG/ACT inhaler Inhale 2 puffs into the lungs every 6 (six) hours as needed. 04/22/22     ALPRAZolam (XANAX) 0.25 MG tablet Take 1 tablet (0.25 mg total) by mouth 2 (two) times daily as needed for flying in planes 04/09/21     Blood Pressure Monitoring (OMRON 3 SERIES BP MONITOR) DEVI Use as directed. 07/24/20     Calcium-Magnesium-Vitamin D 600-40-500 MG-MG-UNIT TB24 Take 3 tablets by mouth daily.    [provider]  cloNIDine (CATAPRES - DOSED IN MG/24 HR) 0.2 mg/24hr patch Place 1 patch (0.2 mg total) onto the skin weekly 12/31/20     cloNIDine (CATAPRES - DOSED IN MG/24 HR) 0.2 mg/24hr patch Place 1 patch (0.2 mg total) onto the skin once a week. 02/10/22     diclofenac sodium (VOLTAREN) 1 % GEL Apply 2 g topically as needed. To affected joint 10/13/17   [provider]  diclofenac Sodium (VOLTAREN) 1 % GEL Apply 1 gram application as directed topically 3 (three) times daily to affected joint 06/18/21     diphenhydrAMINE (BENADRYL) 25 mg capsule Take 25 mg by mouth every 6 (six) hours as needed.    [provider]  diphenhydrAMINE-zinc acetate (BENADRYL EXTRA STRENGTH) cream Apply 1 application topically 3 (three) times daily as needed for itching (Apply on  hands). 12/12/17   Burnadette Pop, MD  EPINEPHrine 0.3 mg/0.3 mL IJ SOAJ injection Inject 0.3 mg into the muscle as directed as needed 01/22/21     esomeprazole (NEXIUM) 40 MG capsule Take 1 capsule (40 mg total) by mouth 2 (two) times daily. 06/18/21     Evolocumab (REPATHA SURECLICK) 140 MG/ML SOAJ Inject 140 mg into the skin every 14 (fourteen) days. 12/19/21     famotidine (PEPCID) 20 MG tablet Take 1 tablet (20 mg total) by mouth 2 (two) times daily. 12/12/17   Burnadette Pop, MD  furosemide (LASIX) 20 MG tablet  02/18/18   [provider]  influenza vac split quadrivalent PF (FLUARIX) 0.5 ML injection Inject 0.5 mLs into the muscle. 10/10/21   Judyann Munson, MD  meclizine (ANTIVERT) 50 MG tablet Take 1 tablet (50 mg total) by mouth 3 (three) times daily as needed. 06/01/21   Junie Spencer, FNP  metoprolol tartrate (LOPRESSOR) 50 MG tablet Take 1 tablet (50 mg) by mouth in the morning and 0.5 tablet (25 mg) at bedtime. 03/03/21     metoprolol tartrate (LOPRESSOR) 50 MG tablet Take 1 tablet by mouth daily in the morning and 1/2 tablet in the evening 04/22/22     nitroGLYCERIN (NITROSTAT) 0.4 MG SL tablet Place 1 tablet (0.4 mg total) under the tongue every 5 (five) minutes as needed for chest pain. 10/17/13   Richarda Overlie, MD  ondansetron [facility]) 4  MG tablet Take 1 tablet (4 mg total) by mouth every 8 (eight) hours as needed for nausea or vomiting. 06/01/21   Jannifer Rodney A, FNP  spironolactone (ALDACTONE) 100 MG tablet Take 1 tablet (100 mg total) by mouth daily. 01/30/22     spironolactone (ALDACTONE) 100 MG tablet Take 1 tablet (100 mg total) by mouth daily. 02/10/22        Allergies    Ace inhibitors, Chlorthalidone, Crab [shellfish allergy], Edarbi [azilsartan], Fish allergy, Ivp dye [iodinated contrast media], Losartan, Nizatidine, Bidil [isosorb dinitrate-hydralazine], Amlodipine besylate, Atorvastatin, Dilaudid [hydromorphone hcl], Hydrocodone-acetaminophen, and  Oxycodone-acetaminophen   Review of Systems   Review of Systems Please see HPI for pertinent positives and negatives  Physical Exam BP 138/89   Pulse 72   Temp 97.9 F (36.6 C)   Resp 18   Ht  (1.727 m)   Wt 110.2 kg   LMP 07/05/2009   SpO2 99%   BMI 36.95 kg/m   Physical Exam Vitals and nursing note reviewed.  Constitutional:      Appearance: Normal appearance.  HENT:     Head: Normocephalic and atraumatic.     Nose: Nose normal.     Mouth/Throat:     Mouth: Mucous membranes are moist.  Eyes:     Extraocular Movements: Extraocular movements intact.     Conjunctiva/sclera: Conjunctivae normal.  Cardiovascular:     Rate and Rhythm: Normal rate.  Pulmonary:     Effort: Pulmonary effort is normal.     Breath sounds: Normal breath sounds.  Abdominal:     General: Abdomen is flat.     Palpations: Abdomen is soft.     Tenderness: There is no abdominal tenderness. There is no guarding.  Musculoskeletal:        General: Tenderness (mild tenderness R flank/lumbar paraspinal muscles) present. No swelling. Normal range of motion.     Cervical back: Neck supple.  Skin:    General: Skin is warm and dry.  Neurological:     General: No focal deficit present.     Mental Status: She is alert.  Psychiatric:        Mood and Affect: Mood normal.     ED Results / Procedures / Treatments   EKG None  Procedures Procedures  Medications Ordered in the ED Medications - No data to display  Initial Impression and Plan  Patient here with R flank pain, suspect MSK vs renal colic. Will check labs and send for CT. Patient declines pain medications at the time of my evaluation.   ED Course   Clinical Course as of 04/26/22 0704  Sun Apr 26, 2022  0640 CBC with mild leukocytosis, otherwise normal.  [CS]  0652 UA without signs of infection or blood. Squamous cells likely indicate some contamination.  [CS]  0702 Care of the patient signed out to Dr. Theresia Lo at the change of  shift pending CT.  [CS]    Clinical Course User Index [CS] Pollyann Savoy, MD     MDM Rules/Calculators/A&P Medical Decision Making Problems Addressed: Acute right-sided low back pain without sciatica: acute illness or injury  Amount and/or Complexity of Data Reviewed Labs: ordered. Decision-making details documented in ED Course. Radiology: ordered and independent interpretation performed. Decision-making details documented in ED Course.     Final Clinical Impression(s) / ED Diagnoses Final diagnoses:  Acute right-sided low back pain without sciatica    Rx / DC Orders ED Discharge Orders     None  Pollyann Savoy, MD 04/26/22 [phone]

## 2022-04-26 NOTE — ED Triage Notes (Signed)
Right sided flank pain x 1 week worsening this morning.

## 2022-04-26 NOTE — ED Provider Notes (Signed)
Patient signed out to me at 07 100 by Dr. Bernette Mayers pending CT read.  In short this is a 67 year old female with a past medical history of CKD presenting to the emergency department of 1 week of right-sided flank pain.  Patient's workup showed mild increase of creatinine 1.5 from her baseline around 1.3, many bacteria in the urine but also multiple squames and no leuks or nitrites making UTI unlikely.  She has a mild leukocytosis.  CT read shows no acute abnormality to explain her symptoms.  On my evaluation, the patient's pain is well-controlled.  She states that pain is worse with positions when she is lying flat and improves with sitting up.  Her abdomen is soft and nontender and she has no tenderness to her right flank and no overlying skin changes.  Patient is stable for discharge home with primary care follow-up and was given strict return precautions.   Rexford Maus, DO 04/26/22 [phone]

## 2022-04-26 NOTE — Discharge Instructions (Signed)
You were seen in the emergency department for your right-sided back and flank pain.  Your workup showed no signs of kidney stone or obvious explanation for your pain.  It could be muscle related and you can take Tylenol, Motrin or use lidocaine patches.  You should follow-up with your primary doctor in the next few days to have your symptoms rechecked.  You should return to the emergency department if you have fevers, significantly worsening pain, repetitive vomiting, numbness or weakness in your legs or if you have any other new or concerning symptoms.

## 2022-05-04 ENCOUNTER — Other Ambulatory Visit (HOSPITAL_COMMUNITY): Payer: Self-pay

## 2022-05-04 ENCOUNTER — Other Ambulatory Visit: Payer: Self-pay

## 2022-05-21 ENCOUNTER — Other Ambulatory Visit (HOSPITAL_COMMUNITY): Payer: Self-pay

## 2022-05-27 DIAGNOSIS — E785 Hyperlipidemia, unspecified: Secondary | ICD-10-CM | POA: Diagnosis not present

## 2022-05-27 DIAGNOSIS — Q6102 Congenital multiple renal cysts: Secondary | ICD-10-CM | POA: Diagnosis not present

## 2022-05-27 DIAGNOSIS — I129 Hypertensive chronic kidney disease with stage 1 through stage 4 chronic kidney disease, or unspecified chronic kidney disease: Secondary | ICD-10-CM | POA: Diagnosis not present

## 2022-05-27 DIAGNOSIS — N1831 Chronic kidney disease, stage 3a: Secondary | ICD-10-CM | POA: Diagnosis not present

## 2022-07-21 ENCOUNTER — Other Ambulatory Visit (HOSPITAL_COMMUNITY): Payer: Self-pay

## 2022-07-21 ENCOUNTER — Other Ambulatory Visit: Payer: Self-pay

## 2022-07-21 MED ORDER — ESOMEPRAZOLE MAGNESIUM 40 MG PO CPDR
40.0000 mg | DELAYED_RELEASE_CAPSULE | Freq: Two times a day (BID) | ORAL | 2 refills | Status: AC
  Filled 2022-07-21 – 2022-07-22 (×2): qty 180, 90d supply, fill #0
  Filled 2023-03-03: qty 180, 90d supply, fill #1
  Filled 2023-06-15: qty 180, 90d supply, fill #2

## 2022-07-22 ENCOUNTER — Other Ambulatory Visit (HOSPITAL_COMMUNITY): Payer: Self-pay

## 2022-07-22 ENCOUNTER — Other Ambulatory Visit: Payer: Self-pay

## 2022-08-24 ENCOUNTER — Other Ambulatory Visit (HOSPITAL_COMMUNITY): Payer: Self-pay

## 2022-08-24 ENCOUNTER — Other Ambulatory Visit (HOSPITAL_BASED_OUTPATIENT_CLINIC_OR_DEPARTMENT_OTHER): Payer: Self-pay

## 2022-08-24 MED ORDER — BUDESONIDE-FORMOTEROL FUMARATE 80-4.5 MCG/ACT IN AERO
2.0000 | INHALATION_SPRAY | RESPIRATORY_TRACT | 0 refills | Status: AC | PRN
  Filled 2022-08-24 – 2022-08-26 (×3): qty 10.2, 30d supply, fill #0

## 2022-08-25 ENCOUNTER — Other Ambulatory Visit (HOSPITAL_COMMUNITY): Payer: Self-pay

## 2022-08-25 ENCOUNTER — Other Ambulatory Visit: Payer: Self-pay

## 2022-08-26 ENCOUNTER — Other Ambulatory Visit (HOSPITAL_COMMUNITY): Payer: Self-pay

## 2022-08-26 ENCOUNTER — Other Ambulatory Visit (HOSPITAL_BASED_OUTPATIENT_CLINIC_OR_DEPARTMENT_OTHER): Payer: Self-pay

## 2022-09-06 ENCOUNTER — Telehealth: Payer: Commercial Managed Care - PPO | Admitting: Emergency Medicine

## 2022-09-06 DIAGNOSIS — R3989 Other symptoms and signs involving the genitourinary system: Secondary | ICD-10-CM | POA: Diagnosis not present

## 2022-09-06 MED ORDER — CEPHALEXIN 500 MG PO CAPS: 500.0000 mg | ORAL_CAPSULE | Freq: Two times a day (BID) | ORAL | 0 refills | Status: DC

## 2022-09-06 NOTE — Patient Instructions (Signed)
[name], thank you for joining Bonnie Parsons, NP for today's virtual visit.  While this provider is not your primary care provider (PCP), if your PCP is located in our provider database this encounter information will be shared with them immediately following your visit.   [name] MyChart account gives you access to today's visit and all your visits, tests, and labs performed at [facility] " click here if you don't have Bonnie [name] MyChart account or go to [email or web address]  Consent: (Patient) [name] provided verbal consent for this virtual visit at the beginning of the encounter.  Current Medications:  Current Outpatient Medications:    cephALEXin (KEFLEX) 500 MG capsule, Take 1 capsule (500 mg total) by mouth 2 (two) times daily for 7 days., Disp: 14 capsule, Rfl: 0   albuterol (VENTOLIN HFA) 108 (90 Base) MCG/ACT inhaler, Inhale 2 puffs by mouth every 6 hours as needed, Disp: 18 g, Rfl: 2   albuterol (VENTOLIN HFA) 108 (90 Base) MCG/ACT inhaler, Inhale 2 puffs into the lungs every 6 (six) hours as needed., Disp: 13.4 g, Rfl: 0   ALPRAZolam (XANAX) 0.25 MG tablet, Take 1 tablet (0.25 mg total) by mouth 2 (two) times daily as needed for flying in planes, Disp: 15 tablet, Rfl: 0   Blood Pressure Monitoring (OMRON 3 SERIES BP MONITOR) DEVI, Use as directed., Disp: 1 each, Rfl: 0   budesonide-formoterol (SYMBICORT) 80-4.5 MCG/ACT inhaler, Inhale 2 puffs into the lungs as needed up to 12 puffs per day, Disp: 10.2 g, Rfl: 0   Calcium-Magnesium-Vitamin D 600-40-500 MG-MG-UNIT TB24, Take 3 tablets by mouth daily., Disp: , Rfl:    cloNIDine (CATAPRES - DOSED IN MG/24 HR) 0.2 mg/24hr patch, Place 1 patch (0.2 mg total) onto the skin weekly, Disp: 12 patch, Rfl: 3   cloNIDine (CATAPRES - DOSED IN MG/24 HR) 0.2 mg/24hr patch, Place 1 patch (0.2 mg total) onto the skin once Bonnie week., Disp: 13 patch, Rfl: 3   diclofenac sodium (VOLTAREN) 1 % GEL, Apply 2 g  topically as needed. To affected joint, Disp: , Rfl: 5   diclofenac Sodium (VOLTAREN) 1 % GEL, Apply 1 gram application as directed topically 3 (three) times daily to affected joint, Disp: 100 g, Rfl: 5   diphenhydrAMINE (BENADRYL) 25 mg capsule, Take 25 mg by mouth every 6 (six) hours as needed., Disp: , Rfl:    diphenhydrAMINE-zinc acetate (BENADRYL EXTRA STRENGTH) cream, Apply 1 application topically 3 (three) times daily as needed for itching (Apply on hands)., Disp: 28.4 g, Rfl: 0   EPINEPHrine 0.3 mg/0.3 mL IJ SOAJ injection, Inject 0.3 mg into the muscle as directed as needed, Disp: 2 each, Rfl: 1   esomeprazole (NEXIUM) 40 MG capsule, Take 1 capsule (40 mg total) by mouth 2 (two) times daily., Disp: 180 capsule, Rfl: 2   Evolocumab (REPATHA SURECLICK) 140 MG/ML SOAJ, Inject 140 mg into the skin every 14 (fourteen) days., Disp: 2 mL, Rfl: 3   famotidine (PEPCID) 20 MG tablet, Take 1 tablet (20 mg total) by mouth 2 (two) times daily., Disp: 14 tablet, Rfl: 0   furosemide (LASIX) 20 MG tablet, , Disp: , Rfl:    influenza vac split quadrivalent PF (FLUARIX) 0.5 ML injection, Inject 0.5 mLs into the muscle., Disp: 0.5 mL, Rfl: 0   meclizine (ANTIVERT) 50 MG tablet, Take 1 tablet (50 mg total) by mouth 3 (three) times daily as needed., Disp: 30 tablet, Rfl: 0   metoprolol tartrate (LOPRESSOR)  50 MG tablet, Take 1 tablet (50 mg) by mouth in the morning and 0.5 tablet (25 mg) at bedtime., Disp: 135 tablet, Rfl: 3   metoprolol tartrate (LOPRESSOR) 50 MG tablet, Take 1 tablet by mouth daily in the morning and 1/2 tablet in the evening, Disp: 135 tablet, Rfl: 3   nitroGLYCERIN (NITROSTAT) 0.4 MG SL tablet, Place 1 tablet (0.4 mg total) under the tongue every 5 (five) minutes as needed for chest pain., Disp: 30 tablet, Rfl: 12   ondansetron (ZOFRAN) 4 MG tablet, Take 1 tablet (4 mg total) by mouth every 8 (eight) hours as needed for nausea or vomiting., Disp: 20 tablet, Rfl: 0   spironolactone (ALDACTONE)  100 MG tablet, Take 1 tablet (100 mg total) by mouth daily., Disp: 7 tablet, Rfl: 0   spironolactone (ALDACTONE) 100 MG tablet, Take 1 tablet (100 mg total) by mouth daily., Disp: 90 tablet, Rfl: 3   Medications ordered in this encounter:  Meds ordered this encounter  Medications   cephALEXin (KEFLEX) 500 MG capsule    Sig: Take 1 capsule (500 mg total) by mouth 2 (two) times daily for 7 days.    Dispense:  14 capsule    Refill:  0     *If you need refills on other medications prior to your next appointment, please contact your pharmacy*  Follow-Up: Call back or seek an in-person evaluation if the symptoms worsen or if the condition fails to improve as anticipated.  [name] [phone]  Other Instructions Please drink plenty of fluids.   If you are not getting better, follow up with [name] or be seen at an in person urgent care to have your urine checked.   If you are not getting better and develop fever and/or frequent vomiting, please seek care in the emergency room   If you have been instructed to have an in-person evaluation today at Bonnie local Urgent Care facility, please use the link below. It will take you to Bonnie list of all of our available [name], including address, phone number and hours of operation. Please do not delay care.  [name]  If you or Bonnie family member do not have Bonnie primary care provider, use the link below to schedule Bonnie visit and establish care. When you choose [name] primary care physician or advanced practice provider, you gain Bonnie long-term partner in health. Find Bonnie Primary Care Provider  Learn more about [name]'s in-office and virtual care options:  - Get Care Now

## 2022-09-06 NOTE — Progress Notes (Signed)
Virtual Visit Consent   [name], you are scheduled for a virtual visit with a [facility] provider today. Just as with appointments in the office, your consent must be obtained to participate. Your consent will be active for this visit and any virtual visit you may have with one of our providers in the next 365 days. If you have a MyChart account, a copy of this consent can be sent to you electronically.  As this is a virtual visit, video technology does not allow for your provider to perform a traditional examination. This may limit your provider's ability to fully assess your condition. If your provider identifies any concerns that need to be evaluated in person or the need to arrange testing (such as labs, EKG, etc.), we will make arrangements to do so. Although advances in technology are sophisticated, we cannot ensure that it will always work on either your end or our end. If the connection with a video visit is poor, the visit may have to be switched to a telephone visit. With either a video or telephone visit, we are not always able to ensure that we have a secure connection.  By engaging in this virtual visit, you consent to the provision of healthcare and authorize for your insurance to be billed (if applicable) for the services provided during this visit. Depending on your insurance coverage, you may receive a charge related to this service.  I need to obtain your verbal consent now. Are you willing to proceed with your visit today? [name] has provided verbal consent on 09/06/2022 for a virtual visit (video or telephone). [name], NP  Date: 09/06/2022 11:08 AM  Virtual Visit via Video Note   I, [name], connected with  [name]  ([id number], [birth year]) on 09/06/22 at 11:00 AM EDT by a video-enabled telemedicine application and verified that I am speaking with the correct person using two identifiers.  Location: Patient: Virtual Visit Location  Patient: Home Provider: Virtual Visit Location Provider: Home Office   I discussed the limitations of evaluation and management by telemedicine and the availability of in person appointments. The patient expressed understanding and agreed to proceed.    History of Present Illness: [name] is a 67 y.o. who identifies as a female who was assigned female at birth, and is being seen today for urinary infection. Started with foul smelling urine about a week ago but wasn't able to address it as she was at [employer] supporting her daughter who was having brain surgery. About  3 days ago, started also having R flank pain that radiates to her R abd. Denies pain on L side. No N/V. No fever. Reports home VS of Temp 97.70F, BP 118/79, HR 78. No vaginal discharge.   Feels same as uti she had 4-5 months ago - that infection was treated by nephrologist Dr. Bufford Buttner with Keflex successfully. Is unable to reach Dr.  Signe Colt at this time as it is a weekend. Hx Stage 3b CKD  HPI: HPI  Problems:  Patient Active Problem List   Diagnosis Date Noted   Gastritis 12/16/2017   Hyperlipemia 12/15/2017   GERD (gastroesophageal reflux disease) 12/15/2017   CKD (chronic kidney disease), stage III (HCC) 12/15/2017   Multiple renal cysts 12/15/2017   Angioedema 12/05/2017   Essential hypertension    Family history of breast cancer in female 04/12/2015   Family history of ovarian cancer 04/12/2015   Cervical radiculopathy 04/06/2014   Carpal  tunnel syndrome of left wrist 04/06/2014   Airway compromise 06/28/2013   Headache(784.0) 06/02/2013   ABNORMALITY OF GAIT 03/15/2009    Allergies:  Allergies  Allergen Reactions   Ace Inhibitors     Angioedema    Chlorthalidone     Angioedema    Crab [Shellfish Allergy] Anaphylaxis   Edarbi [Azilsartan] Hypertension   Fish Allergy Anaphylaxis   Ivp Dye [Iodinated Contrast Media] Anaphylaxis   Losartan     Angioedema    Nizatidine Anaphylaxis    Tolerates  famotidine and cimetidine   Bidil [Isosorb Dinitrate-Hydralazine]     Hand swelling   Amlodipine Besylate Other (See Comments)   Atorvastatin Other (See Comments)   Dilaudid [Hydromorphone Hcl] Nausea And Vomiting    Pt can tolerate morphine   Hydrocodone-Acetaminophen Nausea And Vomiting   Oxycodone-Acetaminophen Nausea And Vomiting   Medications:  Current Outpatient Medications:    cephALEXin (KEFLEX) 500 MG capsule, Take 1 capsule (500 mg total) by mouth 2 (two) times daily for 7 days., Disp: 14 capsule, Rfl: 0   albuterol (VENTOLIN HFA) 108 (90 Base) MCG/ACT inhaler, Inhale 2 puffs by mouth every 6 hours as needed, Disp: 18 g, Rfl: 2   albuterol (VENTOLIN HFA) 108 (90 Base) MCG/ACT inhaler, Inhale 2 puffs into the lungs every 6 (six) hours as needed., Disp: 13.4 g, Rfl: 0   ALPRAZolam (XANAX) 0.25 MG tablet, Take 1 tablet (0.25 mg total) by mouth 2 (two) times daily as needed for flying in planes, Disp: 15 tablet, Rfl: 0   Blood Pressure Monitoring (OMRON 3 SERIES BP MONITOR) DEVI, Use as directed., Disp: 1 each, Rfl: 0   budesonide-formoterol (SYMBICORT) 80-4.5 MCG/ACT inhaler, Inhale 2 puffs into the lungs as needed up to 12 puffs per day, Disp: 10.2 g, Rfl: 0   Calcium-Magnesium-Vitamin D 600-40-500 MG-MG-UNIT TB24, Take 3 tablets by mouth daily., Disp: , Rfl:    cloNIDine (CATAPRES - DOSED IN MG/24 HR) 0.2 mg/24hr patch, Place 1 patch (0.2 mg total) onto the skin weekly, Disp: 12 patch, Rfl: 3   cloNIDine (CATAPRES - DOSED IN MG/24 HR) 0.2 mg/24hr patch, Place 1 patch (0.2 mg total) onto the skin once a week., Disp: 13 patch, Rfl: 3   diclofenac sodium (VOLTAREN) 1 % GEL, Apply 2 g topically as needed. To affected joint, Disp: , Rfl: 5   diclofenac Sodium (VOLTAREN) 1 % GEL, Apply 1 gram application as directed topically 3 (three) times daily to affected joint, Disp: 100 g, Rfl: 5   diphenhydrAMINE (BENADRYL) 25 mg capsule, Take 25 mg by mouth every 6 (six) hours as needed., Disp: ,  Rfl:    diphenhydrAMINE-zinc acetate (BENADRYL EXTRA STRENGTH) cream, Apply 1 application topically 3 (three) times daily as needed for itching (Apply on hands)., Disp: 28.4 g, Rfl: 0   EPINEPHrine 0.3 mg/0.3 mL IJ SOAJ injection, Inject 0.3 mg into the muscle as directed as needed, Disp: 2 each, Rfl: 1   esomeprazole (NEXIUM) 40 MG capsule, Take 1 capsule (40 mg total) by mouth 2 (two) times daily., Disp: 180 capsule, Rfl: 2   Evolocumab (REPATHA SURECLICK) 140 MG/ML SOAJ, Inject 140 mg into the skin every 14 (fourteen) days., Disp: 2 mL, Rfl: 3   famotidine (PEPCID) 20 MG tablet, Take 1 tablet (20 mg total) by mouth 2 (two) times daily., Disp: 14 tablet, Rfl: 0   furosemide (LASIX) 20 MG tablet, , Disp: , Rfl:    influenza vac split quadrivalent PF (FLUARIX) 0.5 ML injection, Inject 0.5 mLs  into the muscle., Disp: 0.5 mL, Rfl: 0   meclizine (ANTIVERT) 50 MG tablet, Take 1 tablet (50 mg total) by mouth 3 (three) times daily as needed., Disp: 30 tablet, Rfl: 0   metoprolol tartrate (LOPRESSOR) 50 MG tablet, Take 1 tablet (50 mg) by mouth in the morning and 0.5 tablet (25 mg) at bedtime., Disp: 135 tablet, Rfl: 3   metoprolol tartrate (LOPRESSOR) 50 MG tablet, Take 1 tablet by mouth daily in the morning and 1/2 tablet in the evening, Disp: 135 tablet, Rfl: 3   nitroGLYCERIN (NITROSTAT) 0.4 MG SL tablet, Place 1 tablet (0.4 mg total) under the tongue every 5 (five) minutes as needed for chest pain., Disp: 30 tablet, Rfl: 12   ondansetron (ZOFRAN) 4 MG tablet, Take 1 tablet (4 mg total) by mouth every 8 (eight) hours as needed for nausea or vomiting., Disp: 20 tablet, Rfl: 0   spironolactone (ALDACTONE) 100 MG tablet, Take 1 tablet (100 mg total) by mouth daily., Disp: 7 tablet, Rfl: 0   spironolactone (ALDACTONE) 100 MG tablet, Take 1 tablet (100 mg total) by mouth daily., Disp: 90 tablet, Rfl: 3  Observations/Objective: Patient is well-developed, well-nourished in no acute distress.  Resting  comfortably  at home.  Head is normocephalic, atraumatic.  No labored breathing.  Speech is clear and coherent with logical content.  Patient is alert and oriented at baseline.    Assessment and Plan: 1. Suspected UTI  Flank and abd pain is concerning but no fever and feels like prior UTI to patient. REviewed reasons for seeking f/u care, emergency care. Will rx keflex again.   Follow Up Instructions: I discussed the assessment and treatment plan with the patient. The patient was provided an opportunity to ask questions and all were answered. The patient agreed with the plan and demonstrated an understanding of the instructions.  A copy of instructions were sent to the patient via MyChart unless otherwise noted below.   The patient was advised to call back or seek an in-person evaluation if the symptoms worsen or if the condition fails to improve as anticipated.  Time:  I spent 8 minutes with the patient via telehealth technology discussing the above problems/concerns.    [name], NP

## 2022-09-08 ENCOUNTER — Other Ambulatory Visit (HOSPITAL_COMMUNITY): Payer: Self-pay

## 2022-09-08 MED ORDER — CEPHALEXIN 500 MG PO CAPS
500.0000 mg | ORAL_CAPSULE | Freq: Two times a day (BID) | ORAL | 0 refills | Status: DC
  Filled 2022-09-08 – 2022-09-09 (×2): qty 14, 7d supply, fill #0

## 2022-09-09 ENCOUNTER — Other Ambulatory Visit (HOSPITAL_COMMUNITY): Payer: Self-pay

## 2022-09-10 ENCOUNTER — Other Ambulatory Visit (HOSPITAL_COMMUNITY): Payer: Self-pay

## 2022-09-10 ENCOUNTER — Ambulatory Visit (INDEPENDENT_AMBULATORY_CARE_PROVIDER_SITE_OTHER): Payer: Commercial Managed Care - PPO

## 2022-09-10 ENCOUNTER — Ambulatory Visit (HOSPITAL_COMMUNITY)
Admission: EM | Admit: 2022-09-10 | Discharge: 2022-09-10 | Disposition: A | Discharge status: Home / Self Care | Payer: Commercial Managed Care - PPO | Attending: Family Medicine | Admitting: Family Medicine

## 2022-09-10 ENCOUNTER — Encounter (HOSPITAL_COMMUNITY): Payer: Self-pay

## 2022-09-10 DIAGNOSIS — R310 Gross hematuria: Secondary | ICD-10-CM

## 2022-09-10 DIAGNOSIS — R109 Unspecified abdominal pain: Secondary | ICD-10-CM

## 2022-09-10 DIAGNOSIS — R918 Other nonspecific abnormal finding of lung field: Secondary | ICD-10-CM | POA: Diagnosis not present

## 2022-09-10 LAB — POCT URINALYSIS DIP (MANUAL ENTRY)
Bilirubin, UA: NEGATIVE — AB
Glucose, UA: NEGATIVE mg/dL
Ketones, POC UA: NEGATIVE mg/dL
Nitrite, UA: NEGATIVE
Spec Grav, UA: 1.02 (ref 1.010–1.025)
Urobilinogen, UA: 0.2 U/dL
pH, UA: 6 (ref 5.0–8.0)

## 2022-09-10 MED ORDER — CEFTRIAXONE SODIUM 1 G IJ SOLR
1.0000 g | Freq: Once | INTRAMUSCULAR | Status: AC
  Administered 2022-09-10: 1 g via INTRAMUSCULAR

## 2022-09-10 MED ORDER — KETOROLAC TROMETHAMINE 30 MG/ML IJ SOLN
30.0000 mg | Freq: Once | INTRAMUSCULAR | Status: AC
  Administered 2022-09-10: 30 mg via INTRAMUSCULAR

## 2022-09-10 MED ORDER — KETOROLAC TROMETHAMINE 30 MG/ML IJ SOLN
INTRAMUSCULAR | Status: AC
  Filled 2022-09-10: qty 1

## 2022-09-10 MED ORDER — LIDOCAINE HCL (PF) 1 % IJ SOLN
INTRAMUSCULAR | Status: AC
  Filled 2022-09-10: qty 2

## 2022-09-10 MED ORDER — CEFTRIAXONE SODIUM 1 G IJ SOLR
INTRAMUSCULAR | Status: AC
  Filled 2022-09-10: qty 10

## 2022-09-10 MED ORDER — SULFAMETHOXAZOLE-TRIMETHOPRIM 800-160 MG PO TABS
1.0000 | ORAL_TABLET | Freq: Two times a day (BID) | ORAL | 0 refills | Status: AC
  Filled 2022-09-10: qty 14, 7d supply, fill #0

## 2022-09-10 NOTE — ED Provider Notes (Signed)
[facility]    CSN: [id number] Arrival date & time: 09/10/22  0850      History   Chief Complaint Chief Complaint  Patient presents with   Urinary Tract Infection   Back Pain    HPI [name] is a 67 y.o. female.    Urinary Tract Infection Associated symptoms: flank pain   Back Pain  Started a bit over a week ago with foul smelling urine. 5 days ago she started with pain at the right  flank/back area.  This was worse with movements, if she turned, squatted, use her back, etc.  No heavy lifting.  Called for telehealth 4 days ago, dx with UTI.  Given keflex, and did not really see any difference in her back pain.  It was better yesterday afternoon (but she was doubling her dose).  She went back to 1 tab, but the pain is worsened.  She did not bloody urine this morning, whish is new for her.   The smell is improved.  She is having urinary frequency, incontinence.  She does have a h/o pyelonephritis 40 years ago, recurrent UTIs.   Not sure about kidney stone per se.  No fevers/chills.  No n/v.  CT scan 04/2022, no kidney stones.        Past Medical History:  Diagnosis Date   Abnormal Pap smear of cervix    Anemia    as a child   Anxiety    Bulging lumbar disc    Congestive heart failure (CHF) (HCC)    GERD (gastroesophageal reflux disease)    Hypertension    Lactose intolerance    MVP (mitral valve prolapse)    Pyelonephritis    Renal disorder    Sleep apnea    Spinal stenosis     Patient Active Problem List   Diagnosis Date Noted   Gastritis 12/16/2017   Hyperlipemia 12/15/2017   GERD (gastroesophageal reflux disease) 12/15/2017   CKD (chronic kidney disease), stage III (HCC) 12/15/2017   Multiple renal cysts 12/15/2017   Angioedema 12/05/2017   Essential hypertension    Family history of breast cancer in female 04/12/2015   Family history of ovarian cancer 04/12/2015   Cervical radiculopathy 04/06/2014   Carpal tunnel syndrome of left  wrist 04/06/2014   Airway compromise 06/28/2013   Headache(784.0) 06/02/2013   ABNORMALITY OF GAIT 03/15/2009    Past Surgical History:  Procedure Laterality Date   ankle fracture  10/06/2007   bilateral-right was surgically repaired   ANKLE SURGERY  10/05/2008   screws removed   ANKLE SURGERY  02/05/2010   removal of all hardware right ankle   APPENDECTOMY     arm surgery     plate in left arm   BREAST BIOPSY Right    CHOLECYSTECTOMY     DILATION AND CURETTAGE OF UTERUS      OB History     Gravida  4   Para  2   Term      Preterm      AB  2   Living  2      SAB  2   IAB      Ectopic      Multiple      Live Births           Obstetric Comments  1 stepchild          Home Medications    Prior to Admission medications   Medication Sig Start Date End Date  Taking? Authorizing Provider  budesonide-formoterol (SYMBICORT) 80-4.5 MCG/ACT inhaler Inhale 2 puffs into the lungs as needed up to 12 puffs per day 08/24/22  Yes   Calcium-Magnesium-Vitamin D 600-40-500 MG-MG-UNIT TB24 Take 3 tablets by mouth daily.   Yes [provider]  cephALEXin (KEFLEX) 500 MG capsule Take 1 capsule (500 mg total) by mouth 2 (two) times daily for 7 days. 09/08/22 09/16/22 Yes   cloNIDine (CATAPRES - DOSED IN MG/24 HR) 0.2 mg/24hr patch Place 1 patch (0.2 mg total) onto the skin once a week. 02/10/22  Yes   esomeprazole (NEXIUM) 40 MG capsule Take 1 capsule (40 mg total) by mouth 2 (two) times daily. 07/21/22  Yes   famotidine (PEPCID) 20 MG tablet Take 1 tablet (20 mg total) by mouth 2 (two) times daily. 12/12/17  Yes Burnadette Pop, MD  furosemide (LASIX) 20 MG tablet  02/18/18  Yes [provider]  meclizine (ANTIVERT) 50 MG tablet Take 1 tablet (50 mg total) by mouth 3 (three) times daily as needed. 06/01/21  Yes Hawks, Christy A, FNP  metoprolol tartrate (LOPRESSOR) 50 MG tablet Take 1 tablet by mouth daily in the morning and 1/2 tablet in the evening 04/22/22  Yes    Potassium Gluconate 550 MG TABS Take by mouth. Prn with lasix   Yes [provider]  spironolactone (ALDACTONE) 100 MG tablet Take 1 tablet (100 mg total) by mouth daily. 02/10/22  Yes   albuterol (VENTOLIN HFA) 108 (90 Base) MCG/ACT inhaler Inhale 2 puffs by mouth every 6 hours as needed 09/28/19   Marden Noble, MD  albuterol (VENTOLIN HFA) 108 (90 Base) MCG/ACT inhaler Inhale 2 puffs into the lungs every 6 (six) hours as needed. 04/22/22     ALPRAZolam (XANAX) 0.25 MG tablet Take 1 tablet (0.25 mg total) by mouth 2 (two) times daily as needed for flying in planes 04/09/21     Blood Pressure Monitoring (OMRON 3 SERIES BP MONITOR) DEVI Use as directed. 07/24/20     cephALEXin (KEFLEX) 500 MG capsule Take 1 capsule (500 mg total) by mouth 2 (two) times daily for 7 days. 09/06/22 09/13/22  [name], NP  cloNIDine (CATAPRES - DOSED IN MG/24 HR) 0.2 mg/24hr patch Place 1 patch (0.2 mg total) onto the skin weekly 12/31/20     diclofenac sodium (VOLTAREN) 1 % GEL Apply 2 g topically as needed. To affected joint 10/13/17   [provider]  diclofenac Sodium (VOLTAREN) 1 % GEL Apply 1 gram application as directed topically 3 (three) times daily to affected joint 06/18/21     diphenhydrAMINE (BENADRYL) 25 mg capsule Take 25 mg by mouth every 6 (six) hours as needed.    [provider]  diphenhydrAMINE-zinc acetate (BENADRYL EXTRA STRENGTH) cream Apply 1 application topically 3 (three) times daily as needed for itching (Apply on hands). 12/12/17   Burnadette Pop, MD  EPINEPHrine 0.3 mg/0.3 mL IJ SOAJ injection Inject 0.3 mg into the muscle as directed as needed 01/22/21     Evolocumab (REPATHA SURECLICK) 140 MG/ML SOAJ Inject 140 mg into the skin every 14 (fourteen) days. 12/19/21     influenza vac split quadrivalent PF (FLUARIX) 0.5 ML injection Inject 0.5 mLs into the muscle. 10/10/21   Judyann Munson, MD  metoprolol tartrate (LOPRESSOR) 50 MG tablet Take 1 tablet (50 mg) by mouth in  the morning and 0.5 tablet (25 mg) at bedtime. 03/03/21     nitroGLYCERIN (NITROSTAT) 0.4 MG SL tablet Place 1 tablet (0.4 mg total)  under the tongue every 5 (five) minutes as needed for chest pain. 10/17/13   Richarda Overlie, MD  ondansetron (ZOFRAN) 4 MG tablet Take 1 tablet (4 mg total) by mouth every 8 (eight) hours as needed for nausea or vomiting. 06/01/21   Jannifer Rodney A, FNP  spironolactone (ALDACTONE) 100 MG tablet Take 1 tablet (100 mg total) by mouth daily. 01/30/22       Family History Family History  Problem Relation Age of Onset   Diabetes Maternal Grandmother    Stroke Maternal Grandmother    Kidney disease Maternal Grandmother    Ovarian cancer Paternal Grandmother        dx. late 30s-early 40s   Prostate cancer Paternal Grandfather    Ovarian cancer Paternal Aunt        (x3) paternal aunts dx. ages 58s-late 68s   Breast cancer Maternal Aunt        dx. 35s   Hypertension Mother    Heart disease Mother    Goiter Mother    Other Mother        breast lumpectomy in early 70s, was in the hospital for 5 days after   Heart disease Father    Prostate cancer Father 7       s/p orchiectomy   Stroke Father    Ulcers Brother    Other Brother        elevated PSA level w/ surveillance   Colon polyps Brother        less than 10   Pyelonephritis Daughter    Breast cancer Sister 2       left breast ca - poorly differentiated carcinoma with squamous metaplasia; triple neg; has since had BL mastectomies   Colon polyps Sister        less than 10   Congestive Heart Failure Sister    Prostate cancer Paternal Uncle        (x5) paternal uncles dx. prostate cancer in their 63s   Prostate cancer Maternal Grandfather        d. late 75s with mets to colon   Other Other 39       NOS benign brain tumor; +fluid   Prostate cancer Cousin 61       paternal 1st cousin    Prostate cancer Cousin        paternal 1st cousin, once-removed dx. 40s   Prostate cancer Cousin        (x2)  paternal 1st cousins, once-removed, dx. early 14s   Lung cancer Brother 74       paternal half-brother; smoker   Uterine cancer Maternal Aunt        dx. 30s   Breast cancer Maternal Aunt        dx. bilateral breast cancers - 52s, 61s   Liver cancer Maternal Aunt        +hepatitis C; d. 60s   Congestive Heart Failure Maternal Uncle 68   Stroke Maternal Uncle        d. 52s; (x2 maternal uncles)   Cancer Cousin 98       sinus cancer dx. 30s (maternal 1st cousin)   Lung cancer Cousin        maternal 1st cousin dx. 60s/smoker; maternal 1st cousin   Pancreatic cancer Cousin        maternal 1st cousin dx. late 78s   Prostate cancer Cousin        maternal 1st cousin dx. 40   Breast cancer Cousin        (  x4) maternal 1st cousins dx. late 67s, 50s    Social History Social History   Tobacco Use   Smoking status: Never   Smokeless tobacco: Never  Vaping Use   Vaping status: Never Used  Substance Use Topics   Alcohol use: No    Alcohol/week: 0.0 standard drinks of alcohol   Drug use: No     Allergies   Ace inhibitors, Chlorthalidone, Crab [shellfish allergy], Edarbi [azilsartan], Fish allergy, Ivp dye [iodinated contrast media], Losartan, Nizatidine, Bidil [isosorb dinitrate-hydralazine], Amlodipine besylate, Atorvastatin, Dilaudid [hydromorphone hcl], Evolocumab, Hydrocodone-acetaminophen, and Oxycodone-acetaminophen   Review of Systems Review of Systems  Constitutional: Negative.   HENT: Negative.    Respiratory: Negative.    Cardiovascular: Negative.   Gastrointestinal: Negative.   Genitourinary:  Positive for flank pain and hematuria.  Musculoskeletal:  Positive for back pain.     Physical Exam Triage Vital Signs ED Triage Vitals  Encounter Vitals Group     BP 09/10/22 0911 130/69     Systolic BP Percentile --      Diastolic BP Percentile --      Pulse Rate 09/10/22 0911 70     Resp 09/10/22 0911 18     Temp 09/10/22 0911 97.9 F (36.6 C)     Temp Source  09/10/22 0911 Oral     SpO2 09/10/22 0911 96 %     Weight 09/10/22 0911 240 lb 12.8 oz (109.2 kg)     Height 09/10/22 0911 5\' 8"  (1.727 m)     Head Circumference --      Peak Flow --      Pain Score 09/10/22 0905 4     Pain Loc --      Pain Education --      Exclude from Growth Chart --    No data found.  Updated Vital Signs BP 130/69 (BP Location: Right Arm)   Pulse 70   Temp 97.9 F (36.6 C) (Oral)   Resp 18   Ht 5\' 8"  (1.727 m)   Wt 109.2 kg   LMP 07/05/2009   SpO2 96%   BMI 36.61 kg/m   Visual Acuity Right Eye Distance:   Left Eye Distance:   Bilateral Distance:    Right Eye Near:   Left Eye Near:    Bilateral Near:     Physical Exam Constitutional:      Appearance: Normal appearance. She is not ill-appearing.     Comments: Appears uncomfortable due to pain  Cardiovascular:     Rate and Rhythm: Normal rate and regular rhythm.  Pulmonary:     Effort: Pulmonary effort is normal.     Breath sounds: Normal breath sounds.  Abdominal:     Palpations: Abdomen is soft.     Tenderness: There is no abdominal tenderness. There is right CVA tenderness. There is no guarding or rebound.  Musculoskeletal:     Comments: Decreased rom due to pain at the right flank/back  Neurological:     General: No focal deficit present.     Mental Status: She is alert.  Psychiatric:        Mood and Affect: Mood normal.      UC Treatments / Results  Labs (all labs ordered are listed, but only abnormal results are displayed) Labs Reviewed  POCT URINALYSIS DIP (MANUAL ENTRY) - Abnormal; Notable for the following components:      Result Value   Color, UA orange (*)    Clarity, UA hazy (*)  Bilirubin, UA negative (*)    Blood, UA large (*)    Protein Ur, POC trace (*)    Leukocytes, UA Small (1+) (*)    All other components within normal limits  URINE CULTURE    EKG   Radiology No results found.  Procedures Procedures (including critical care time)  Medications  Ordered in UC Medications  cefTRIAXone (ROCEPHIN) injection 1 g (has no administration in time range)  ketorolac (TORADOL) 30 MG/ML injection 30 mg (has no administration in time range)    Initial Impression / Assessment and Plan / UC Course  I have reviewed the triage vital signs and the nursing notes.  Pertinent labs & imaging results that were available during my care of the patient were reviewed by me and considered in my medical decision making (see chart for details).  Patient was seen today for flank pain with UTI symptoms.  I suspect pyelonephritis .  She is afebrile, and states the pain is actually a bit better than previous, but having hematuria today.   Will treat with rocephin today, and bactrim bid x 7 days.  I do not see anything emergent to warrant ER visit at this time.  I do recommend she go to the ER if she has worsening pain, fever, chills, nausea or vomiting.   Final Clinical Impressions(s) / UC Diagnoses   Final diagnoses:  Gross hematuria  Flank pain     Discharge Instructions      You were seen today for UTI/possible pyelonephritis.  I have given you a shot of an antibiotic and pain medication today.  I have sent bactrim to your pharmacy.  Your xray does not show anything concerning.  If the radiologist states otherwise we will call and notify you.  If you have worsening pain, fever, chills, nausea or vomiting then please go to the ER for further evaluation.      ED Prescriptions     Medication Sig Dispense Auth. Provider   sulfamethoxazole-trimethoprim (BACTRIM DS) 800-160 MG tablet Take 1 tablet by mouth 2 (two) times daily for 7 days. 14 tablet Jannifer Franklin, MD      PDMP not reviewed this encounter.   Jannifer Franklin, MD 09/10/22 1007

## 2022-09-10 NOTE — ED Triage Notes (Signed)
Patient having low right side back pain going into the right flank. Patient having foul smelling urine for a week now. Was placed on keflex Sunday through a video visit.

## 2022-09-10 NOTE — Discharge Instructions (Signed)
You were seen today for UTI/possible pyelonephritis.  I have given you a shot of an antibiotic and pain medication today.  I have sent bactrim to your pharmacy.  Your xray does not show anything concerning.  If the radiologist states otherwise we will call and notify you.  If you have worsening pain, fever, chills, nausea or vomiting then please go to the ER for further evaluation.

## 2022-09-11 LAB — URINE CULTURE: Culture: 50000 — AB

## 2022-09-14 ENCOUNTER — Telehealth: Payer: Self-pay

## 2022-09-14 ENCOUNTER — Other Ambulatory Visit (HOSPITAL_COMMUNITY): Payer: Self-pay

## 2022-09-14 MED ORDER — FLUCONAZOLE 150 MG PO TABS: 300.0000 mg | ORAL_TABLET | Freq: Every day | ORAL | Status: AC

## 2022-09-14 NOTE — Telephone Encounter (Signed)
Per Helaine Chess, PA, "treat with 300 mg (two 150 mg tablets) every day x 14 days. If no better she should be retested by her PCP for more specific testing."  Reviewed with patient, verified pharmacy, prescription sent.

## 2022-09-15 ENCOUNTER — Telehealth: Payer: Self-pay

## 2022-09-15 ENCOUNTER — Other Ambulatory Visit (HOSPITAL_COMMUNITY): Payer: Self-pay

## 2022-09-15 MED ORDER — FLUCONAZOLE 150 MG PO TABS: 300.0000 mg | ORAL_TABLET | Freq: Every day | ORAL | 0 refills | Status: AC

## 2022-09-15 NOTE — Telephone Encounter (Signed)
Error in previous telephone order with pharmacy.

## 2022-10-11 ENCOUNTER — Other Ambulatory Visit (HOSPITAL_COMMUNITY): Payer: Self-pay

## 2022-10-15 ENCOUNTER — Other Ambulatory Visit: Payer: Self-pay | Admitting: Allergy and Immunology

## 2022-10-15 ENCOUNTER — Ambulatory Visit
Admission: RE | Admit: 2022-10-15 | Discharge: 2022-10-15 | Disposition: A | Discharge status: Home / Self Care | Payer: Commercial Managed Care - PPO | Source: Ambulatory Visit | Attending: Allergy and Immunology | Admitting: Allergy and Immunology

## 2022-10-15 DIAGNOSIS — R052 Subacute cough: Secondary | ICD-10-CM | POA: Diagnosis not present

## 2022-10-15 DIAGNOSIS — Z91013 Allergy to seafood: Secondary | ICD-10-CM | POA: Diagnosis not present

## 2022-10-15 DIAGNOSIS — T781XXA Other adverse food reactions, not elsewhere classified, initial encounter: Secondary | ICD-10-CM | POA: Diagnosis not present

## 2022-10-27 ENCOUNTER — Other Ambulatory Visit: Payer: Self-pay | Admitting: Family Medicine

## 2022-10-27 DIAGNOSIS — Z1231 Encounter for screening mammogram for malignant neoplasm of breast: Secondary | ICD-10-CM

## 2022-10-29 ENCOUNTER — Ambulatory Visit
Admission: RE | Admit: 2022-10-29 | Discharge: 2022-10-29 | Disposition: A | Discharge status: Home / Self Care | Payer: Commercial Managed Care - PPO | Source: Ambulatory Visit

## 2022-10-29 DIAGNOSIS — Z1231 Encounter for screening mammogram for malignant neoplasm of breast: Secondary | ICD-10-CM

## 2022-11-27 ENCOUNTER — Other Ambulatory Visit (HOSPITAL_COMMUNITY): Payer: Self-pay

## 2022-11-27 DIAGNOSIS — Q6102 Congenital multiple renal cysts: Secondary | ICD-10-CM | POA: Diagnosis not present

## 2022-11-27 DIAGNOSIS — N2581 Secondary hyperparathyroidism of renal origin: Secondary | ICD-10-CM | POA: Diagnosis not present

## 2022-11-27 DIAGNOSIS — E785 Hyperlipidemia, unspecified: Secondary | ICD-10-CM | POA: Diagnosis not present

## 2022-11-27 DIAGNOSIS — I129 Hypertensive chronic kidney disease with stage 1 through stage 4 chronic kidney disease, or unspecified chronic kidney disease: Secondary | ICD-10-CM | POA: Diagnosis not present

## 2022-11-27 DIAGNOSIS — M549 Dorsalgia, unspecified: Secondary | ICD-10-CM | POA: Diagnosis not present

## 2022-11-27 DIAGNOSIS — N1831 Chronic kidney disease, stage 3a: Secondary | ICD-10-CM | POA: Diagnosis not present

## 2022-11-27 MED ORDER — CYCLOBENZAPRINE HCL 10 MG PO TABS
10.0000 mg | ORAL_TABLET | Freq: Three times a day (TID) | ORAL | 3 refills | Status: AC
  Filled 2022-11-27: qty 60, 20d supply, fill #0
  Filled 2023-03-03: qty 60, 20d supply, fill #1
  Filled 2023-06-15: qty 60, 20d supply, fill #2
  Filled 2023-07-30: qty 60, 20d supply, fill #3

## 2022-12-18 ENCOUNTER — Ambulatory Visit (HOSPITAL_BASED_OUTPATIENT_CLINIC_OR_DEPARTMENT_OTHER): Payer: Self-pay | Admitting: Obstetrics & Gynecology

## 2022-12-23 ENCOUNTER — Other Ambulatory Visit (HOSPITAL_COMMUNITY): Payer: Self-pay

## 2023-03-03 ENCOUNTER — Other Ambulatory Visit: Payer: Self-pay

## 2023-03-03 ENCOUNTER — Other Ambulatory Visit (HOSPITAL_COMMUNITY): Payer: Self-pay

## 2023-03-04 ENCOUNTER — Other Ambulatory Visit (HOSPITAL_COMMUNITY): Payer: Self-pay

## 2023-03-04 ENCOUNTER — Other Ambulatory Visit: Payer: Self-pay

## 2023-03-04 MED ORDER — CLONIDINE 0.2 MG/24HR TD PTWK
0.2000 mg | MEDICATED_PATCH | TRANSDERMAL | 0 refills | Status: DC
  Filled 2023-03-04: qty 12, 84d supply, fill #0

## 2023-03-04 MED ORDER — DICLOFENAC SODIUM 1 % EX GEL
CUTANEOUS | 0 refills | Status: AC
  Filled 2023-03-04: qty 100, 7d supply, fill #0

## 2023-03-05 ENCOUNTER — Other Ambulatory Visit (HOSPITAL_COMMUNITY): Payer: Self-pay

## 2023-03-05 DIAGNOSIS — K219 Gastro-esophageal reflux disease without esophagitis: Secondary | ICD-10-CM | POA: Diagnosis not present

## 2023-03-05 DIAGNOSIS — Z91013 Allergy to seafood: Secondary | ICD-10-CM | POA: Diagnosis not present

## 2023-03-05 DIAGNOSIS — R052 Subacute cough: Secondary | ICD-10-CM | POA: Diagnosis not present

## 2023-03-05 DIAGNOSIS — J3089 Other allergic rhinitis: Secondary | ICD-10-CM | POA: Diagnosis not present

## 2023-03-08 ENCOUNTER — Other Ambulatory Visit (HOSPITAL_COMMUNITY): Payer: Self-pay

## 2023-03-08 MED ORDER — OMRON 3 SERIES BP MONITOR DEVI
0 refills | Status: AC
  Filled 2023-03-08: qty 1, 30d supply, fill #0

## 2023-03-09 DIAGNOSIS — K219 Gastro-esophageal reflux disease without esophagitis: Secondary | ICD-10-CM | POA: Diagnosis not present

## 2023-03-09 DIAGNOSIS — G4733 Obstructive sleep apnea (adult) (pediatric): Secondary | ICD-10-CM | POA: Diagnosis not present

## 2023-03-09 DIAGNOSIS — J45909 Unspecified asthma, uncomplicated: Secondary | ICD-10-CM | POA: Diagnosis not present

## 2023-03-09 DIAGNOSIS — Z0000 Encounter for general adult medical examination without abnormal findings: Secondary | ICD-10-CM | POA: Diagnosis not present

## 2023-03-09 DIAGNOSIS — E7800 Pure hypercholesterolemia, unspecified: Secondary | ICD-10-CM | POA: Diagnosis not present

## 2023-03-09 DIAGNOSIS — I129 Hypertensive chronic kidney disease with stage 1 through stage 4 chronic kidney disease, or unspecified chronic kidney disease: Secondary | ICD-10-CM | POA: Diagnosis not present

## 2023-03-09 DIAGNOSIS — N2581 Secondary hyperparathyroidism of renal origin: Secondary | ICD-10-CM | POA: Diagnosis not present

## 2023-03-09 DIAGNOSIS — N1832 Chronic kidney disease, stage 3b: Secondary | ICD-10-CM | POA: Diagnosis not present

## 2023-03-09 DIAGNOSIS — I509 Heart failure, unspecified: Secondary | ICD-10-CM | POA: Diagnosis not present

## 2023-03-11 ENCOUNTER — Other Ambulatory Visit: Payer: Self-pay

## 2023-03-11 ENCOUNTER — Other Ambulatory Visit (HOSPITAL_COMMUNITY): Payer: Self-pay

## 2023-04-07 ENCOUNTER — Other Ambulatory Visit (HOSPITAL_COMMUNITY): Payer: Self-pay

## 2023-04-08 ENCOUNTER — Ambulatory Visit (HOSPITAL_BASED_OUTPATIENT_CLINIC_OR_DEPARTMENT_OTHER): Payer: Commercial Managed Care - PPO | Admitting: Obstetrics & Gynecology

## 2023-04-09 ENCOUNTER — Other Ambulatory Visit (HOSPITAL_COMMUNITY): Payer: Self-pay

## 2023-04-09 ENCOUNTER — Encounter (HOSPITAL_BASED_OUTPATIENT_CLINIC_OR_DEPARTMENT_OTHER): Payer: Self-pay | Admitting: Obstetrics & Gynecology

## 2023-04-09 ENCOUNTER — Ambulatory Visit (HOSPITAL_BASED_OUTPATIENT_CLINIC_OR_DEPARTMENT_OTHER): Payer: Commercial Managed Care - PPO | Admitting: Obstetrics & Gynecology

## 2023-04-09 VITALS — BP 118/78 | HR 61 | Ht 68.0 in | Wt 247.6 lb

## 2023-04-09 DIAGNOSIS — Z780 Asymptomatic menopausal state: Secondary | ICD-10-CM

## 2023-04-09 DIAGNOSIS — Z01419 Encounter for gynecological examination (general) (routine) without abnormal findings: Secondary | ICD-10-CM | POA: Diagnosis not present

## 2023-04-09 DIAGNOSIS — E2839 Other primary ovarian failure: Secondary | ICD-10-CM

## 2023-04-09 DIAGNOSIS — N3946 Mixed incontinence: Secondary | ICD-10-CM | POA: Diagnosis not present

## 2023-04-09 DIAGNOSIS — Z803 Family history of malignant neoplasm of breast: Secondary | ICD-10-CM

## 2023-04-09 DIAGNOSIS — Z8041 Family history of malignant neoplasm of ovary: Secondary | ICD-10-CM | POA: Diagnosis not present

## 2023-04-09 MED ORDER — SPIRONOLACTONE 100 MG PO TABS
100.0000 mg | ORAL_TABLET | Freq: Every day | ORAL | 3 refills | Status: AC
  Filled 2023-04-09 – 2023-04-22 (×2): qty 90, 90d supply, fill #0
  Filled 2023-07-30: qty 90, 90d supply, fill #1
  Filled 2023-12-10: qty 90, 90d supply, fill #2

## 2023-04-09 NOTE — Progress Notes (Signed)
 ANNUAL EXAM Patient name: [name] MRN [id number]  Date of birth: [birth year] Chief Complaint:   Gynecologic Exam  History of Present Illness:   [name] is a 68 y.o. [id number]  female being seen today for a routine annual exam.  Had issues with blood in her urine.  Went to the ER.  Ultimately, this was due to Repatha.  She is not on cholesterol medication at this point.  Blood in urine has resolved.    Denies vaginal bleeding.    Working Mon - Wed, part time, and loving this schedule.    Family hx of breast cancer in cousins and with her sister.  She did go for genetic testing.  Decided not to proceed.  Sister's breast cancer was triple negative.    Does have some urinary urgency and SUI.  Did go to PT and did not like this.  Doesn't leak with all coughing/sneezing but when her bladder is full.  Referral to urogyn, urodynamics, possible OAB treatment discussed.  Not sure she wants to do anything yet.   Patient's last menstrual period was 07/05/2009.  Last pap 11/21/21. Results were: NILM w/ HRHPV negative. H/O abnormal pap: no Last mammogram: 10/29/22. Results were: normal. Family h/o breast cancer: yes :  sister Last colonoscopy: 08/30/17. Results were: normal. F/u 10 years.  Family h/o colorectal cancer: yes MGF Bone Density: 01/26/18.  Normal.     04/09/2023   11:20 AM 11/21/2021    8:45 AM 11/18/2020    9:19 AM  Depression screen PHQ 2/9  Decreased Interest 0 0 0  Down, Depressed, Hopeless 0 0 0  PHQ - 2 Score 0 0 0    Review of Systems:   Pertinent items are noted in HPI  Denies any vaginal bleeding or vaginal discharge.  She does have urinary urgency with some stress incontinence.  Denies bowel changes.   Pertinent History Reviewed:  Reviewed past medical,surgical, social and family history.   Reviewed problem list, medications and allergies. Physical Assessment:   Vitals:   04/09/23 1114  BP: 118/78  Pulse: 61  Weight: 247 lb 9.6 oz (112.3 kg)   Height: 5\' 8"  (1.727 m)  Body mass index is 37.65 kg/m.        Physical Examination:   General appearance - well appearing, and in no distress  Mental status - alert, oriented to person, place, and time  Psych:  She has a normal mood and affect  Skin - warm and dry, normal color, no suspicious lesions noted  Chest - effort normal, all lung fields clear to auscultation bilaterally  Heart - normal rate and regular rhythm  Neck:  midline trachea, no thyromegaly or nodules  Breasts - breasts appear normal, no suspicious masses, no skin or nipple changes or  axillary nodes  Abdomen - soft, nontender, nondistended, no masses or organomegaly  Pelvic - VULVA: normal appearing vulva with no masses, tenderness or lesions    VAGINA: normal appearing vagina with normal color and discharge, no lesions    CERVIX: normal appearing cervix without discharge or lesions, no CMT  Thin prep pap is not done.  UTERUS: uterus is felt to be normal size, shape, consistency and nontender   ADNEXA: No adnexal masses or tenderness noted.  Rectal - normal rectal, good sphincter tone, no masses felt.   Extremities:  No swelling or varicosities noted  Chaperone present for exam  Assessment & Plan:  1. Well woman exam with routine gynecological exam (Primary) -  Pap smear 11/21/2021.  Not indicated today. - Mammogram 10/29/22 - Colonoscopy 2019, follow up 01 years - Bone mineral density 01/26/2018.  Will repeat with mammogram this year. - lab work done with PCP, Dr. Wynelle Link - vaccines reviewed/updated  2. Postmenopause - not on HRT  3. Hypoestrogenism - DG BONE DENSITY (DXA); Future  4. Family history of ovarian cancer - did refer for genetic testing.  Decided not to proceed.  Pt and I discussed [facility] testing as well.  She does not want to do this right now either.  5. Mixed stress and urge urinary incontinence - urogyn referral offered.  Will let me know if desires to proceed. - medication treatment  discussed as well.  Also will let me know if desires to proceed.  6. Family history of breast cancer    Orders Placed This Encounter  Procedures   DG BONE DENSITY (DXA)    Meds: No orders of the defined types were placed in this encounter.   Follow-up: Return in about 1 year (around 04/08/2024).  Jerene Bears, MD 04/09/2023 8:47 PM

## 2023-04-19 ENCOUNTER — Other Ambulatory Visit (HOSPITAL_COMMUNITY): Payer: Self-pay

## 2023-04-22 ENCOUNTER — Other Ambulatory Visit (HOSPITAL_COMMUNITY): Payer: Self-pay

## 2023-04-29 ENCOUNTER — Other Ambulatory Visit (HOSPITAL_COMMUNITY): Payer: Self-pay

## 2023-06-15 ENCOUNTER — Other Ambulatory Visit (HOSPITAL_COMMUNITY): Payer: Self-pay

## 2023-06-15 MED ORDER — METOPROLOL TARTRATE 50 MG PO TABS
ORAL_TABLET | ORAL | 3 refills | Status: AC
  Filled 2023-06-15: qty 135, 90d supply, fill #0
  Filled 2023-09-24: qty 135, 90d supply, fill #1
  Filled 2023-12-24: qty 135, 90d supply, fill #2

## 2023-06-16 ENCOUNTER — Other Ambulatory Visit: Payer: Self-pay

## 2023-06-24 ENCOUNTER — Other Ambulatory Visit (HOSPITAL_COMMUNITY): Payer: Self-pay

## 2023-06-24 MED ORDER — CEPHALEXIN 500 MG PO CAPS
500.0000 mg | ORAL_CAPSULE | Freq: Two times a day (BID) | ORAL | 0 refills | Status: AC
  Filled 2023-06-24 (×2): qty 14, 7d supply, fill #0

## 2023-06-25 ENCOUNTER — Other Ambulatory Visit (HOSPITAL_COMMUNITY): Payer: Self-pay

## 2023-07-04 DIAGNOSIS — M25462 Effusion, left knee: Secondary | ICD-10-CM | POA: Diagnosis not present

## 2023-07-04 DIAGNOSIS — M25562 Pain in left knee: Secondary | ICD-10-CM | POA: Diagnosis not present

## 2023-07-23 DIAGNOSIS — I129 Hypertensive chronic kidney disease with stage 1 through stage 4 chronic kidney disease, or unspecified chronic kidney disease: Secondary | ICD-10-CM | POA: Diagnosis not present

## 2023-07-23 DIAGNOSIS — N2581 Secondary hyperparathyroidism of renal origin: Secondary | ICD-10-CM | POA: Diagnosis not present

## 2023-07-23 DIAGNOSIS — E785 Hyperlipidemia, unspecified: Secondary | ICD-10-CM | POA: Diagnosis not present

## 2023-07-23 DIAGNOSIS — N1831 Chronic kidney disease, stage 3a: Secondary | ICD-10-CM | POA: Diagnosis not present

## 2023-07-23 DIAGNOSIS — Q6102 Congenital multiple renal cysts: Secondary | ICD-10-CM | POA: Diagnosis not present

## 2023-07-30 ENCOUNTER — Other Ambulatory Visit: Payer: Self-pay

## 2023-07-30 ENCOUNTER — Other Ambulatory Visit (HOSPITAL_COMMUNITY): Payer: Self-pay

## 2023-07-30 MED ORDER — VITAMIN D (ERGOCALCIFEROL) 1.25 MG (50000 UNIT) PO CAPS
50000.0000 [IU] | ORAL_CAPSULE | ORAL | 0 refills | Status: AC
  Filled 2023-07-30: qty 8, 56d supply, fill #0

## 2023-07-31 ENCOUNTER — Other Ambulatory Visit (HOSPITAL_COMMUNITY): Payer: Self-pay

## 2023-08-05 DIAGNOSIS — G4733 Obstructive sleep apnea (adult) (pediatric): Secondary | ICD-10-CM | POA: Diagnosis not present

## 2023-08-19 ENCOUNTER — Other Ambulatory Visit (HOSPITAL_COMMUNITY): Payer: Self-pay

## 2023-08-19 DIAGNOSIS — G4733 Obstructive sleep apnea (adult) (pediatric): Secondary | ICD-10-CM | POA: Diagnosis not present

## 2023-08-24 ENCOUNTER — Other Ambulatory Visit: Payer: Self-pay | Admitting: Family Medicine

## 2023-08-24 DIAGNOSIS — Z1231 Encounter for screening mammogram for malignant neoplasm of breast: Secondary | ICD-10-CM

## 2023-08-30 ENCOUNTER — Other Ambulatory Visit (HOSPITAL_COMMUNITY): Payer: Self-pay

## 2023-09-01 ENCOUNTER — Other Ambulatory Visit: Payer: Self-pay

## 2023-09-01 ENCOUNTER — Other Ambulatory Visit (HOSPITAL_COMMUNITY): Payer: Self-pay

## 2023-09-01 MED ORDER — CLONIDINE 0.2 MG/24HR TD PTWK
0.2000 mg | MEDICATED_PATCH | TRANSDERMAL | 0 refills | Status: DC
  Filled 2023-09-01: qty 12, 84d supply, fill #0

## 2023-09-16 DIAGNOSIS — M7541 Impingement syndrome of right shoulder: Secondary | ICD-10-CM | POA: Diagnosis not present

## 2023-09-16 DIAGNOSIS — M25511 Pain in right shoulder: Secondary | ICD-10-CM | POA: Diagnosis not present

## 2023-09-19 DIAGNOSIS — G4733 Obstructive sleep apnea (adult) (pediatric): Secondary | ICD-10-CM | POA: Diagnosis not present

## 2023-09-24 ENCOUNTER — Other Ambulatory Visit (HOSPITAL_COMMUNITY): Payer: Self-pay

## 2023-10-14 DIAGNOSIS — G4733 Obstructive sleep apnea (adult) (pediatric): Secondary | ICD-10-CM | POA: Diagnosis not present

## 2023-10-19 DIAGNOSIS — G4733 Obstructive sleep apnea (adult) (pediatric): Secondary | ICD-10-CM | POA: Diagnosis not present

## 2023-10-28 ENCOUNTER — Other Ambulatory Visit (HOSPITAL_COMMUNITY): Payer: Self-pay

## 2023-10-28 MED ORDER — FLUZONE 0.5 ML IM SUSY
0.5000 mL | PREFILLED_SYRINGE | Freq: Once | INTRAMUSCULAR | 0 refills | Status: AC
  Filled 2023-10-28: qty 0.5, 1d supply, fill #0

## 2023-10-29 ENCOUNTER — Other Ambulatory Visit (HOSPITAL_COMMUNITY): Payer: Self-pay

## 2023-10-29 MED ORDER — MECLIZINE HCL 25 MG PO TABS
25.0000 mg | ORAL_TABLET | Freq: Three times a day (TID) | ORAL | 3 refills | Status: AC | PRN
  Filled 2023-10-29: qty 90, 30d supply, fill #0

## 2023-11-04 ENCOUNTER — Emergency Department (HOSPITAL_COMMUNITY)
Admission: EM | Admit: 2023-11-04 | Discharge: 2023-11-04 | Disposition: A | Discharge status: Home / Self Care | Attending: Emergency Medicine | Admitting: Emergency Medicine

## 2023-11-04 ENCOUNTER — Encounter (HOSPITAL_COMMUNITY): Payer: Self-pay | Admitting: Pharmacy Technician

## 2023-11-04 ENCOUNTER — Other Ambulatory Visit: Payer: Self-pay

## 2023-11-04 ENCOUNTER — Ambulatory Visit
Admission: RE | Admit: 2023-11-04 | Discharge: 2023-11-04 | Disposition: A | Discharge status: Home / Self Care | Source: Ambulatory Visit | Attending: Family Medicine | Admitting: Family Medicine

## 2023-11-04 ENCOUNTER — Other Ambulatory Visit: Payer: Self-pay | Admitting: Obstetrics & Gynecology

## 2023-11-04 DIAGNOSIS — Z1231 Encounter for screening mammogram for malignant neoplasm of breast: Secondary | ICD-10-CM | POA: Diagnosis not present

## 2023-11-04 DIAGNOSIS — K625 Hemorrhage of anus and rectum: Secondary | ICD-10-CM | POA: Insufficient documentation

## 2023-11-04 DIAGNOSIS — D72829 Elevated white blood cell count, unspecified: Secondary | ICD-10-CM | POA: Diagnosis not present

## 2023-11-04 LAB — COMPREHENSIVE METABOLIC PANEL WITH GFR
ALT: 29 U/L (ref 0–44)
AST: 24 U/L (ref 15–41)
Albumin: 3.6 g/dL (ref 3.5–5.0)
Alkaline Phosphatase: 95 U/L (ref 38–126)
Anion gap: 12 (ref 5–15)
BUN: 17 mg/dL (ref 8–23)
CO2: 24 mmol/L (ref 22–32)
Calcium: 9.5 mg/dL (ref 8.9–10.3)
Chloride: 102 mmol/L (ref 98–111)
Creatinine, Ser: 1.58 mg/dL — ABNORMAL HIGH (ref 0.44–1.00)
GFR, Estimated: 36 mL/min — ABNORMAL LOW (ref >60)
Glucose, Bld: 144 mg/dL — ABNORMAL HIGH (ref 70–99)
Potassium: 4 mmol/L (ref 3.5–5.1)
Sodium: 138 mmol/L (ref 135–145)
Total Bilirubin: 0.4 mg/dL (ref 0.0–1.2)
Total Protein: 6.8 g/dL (ref 6.5–8.1)

## 2023-11-04 LAB — CBC
HCT: 42.1 % (ref 36.0–46.0)
Hemoglobin: 13.5 g/dL (ref 12.0–15.0)
MCH: 28.7 pg (ref 26.0–34.0)
MCHC: 32.1 g/dL (ref 30.0–36.0)
MCV: 89.4 fL (ref 80.0–100.0)
Platelets: 293 K/uL (ref 150–400)
RBC: 4.71 MIL/uL (ref 3.87–5.11)
RDW: 14.3 % (ref 11.5–15.5)
WBC: 13.3 K/uL — ABNORMAL HIGH (ref 4.0–10.5)
nRBC: 0 % (ref 0.0–0.2)

## 2023-11-04 LAB — POC OCCULT BLOOD, ED
Fecal Occult Bld: NEGATIVE
Fecal Occult Blood, POC: NEGATIVE

## 2023-11-04 NOTE — Discharge Instructions (Signed)
 Exam and lab work are reassuring today.  Please continue to monitor for any worsening bleeding.  If bleeding continues or gets worse please return to ED for further evaluation.  It is recommended to also follow-up with your primary care provider for reevaluation in the next week.

## 2023-11-04 NOTE — ED Triage Notes (Signed)
 QUICK TRIAGE: Pt ambulatory to ER with c/o rectal bleeding starting today.  States had one large bloody stool and became lightheaded.

## 2023-11-04 NOTE — ED Provider Notes (Signed)
 [place] EMERGENCY DEPARTMENT AT [facility] Provider Note   CSN: [id number] Arrival date & time: 11/04/23  1532     Patient presents with: Rectal Bleeding   [name] is a 68 y.o. female.  68 year old female presents ED with complaints of rectal bleeding x 1 day.  Patient reports she has had diarrhea for the last week.  She advises she had the notion she had to use the restroom and when she went to the bathroom she reported a significant amount of bright red blood.  She does have history of hemorrhoids and is seen by GI.  Patient is up-to-date with colonoscopy.  Patient denies any blood thinners.    Prior to Admission medications   Medication Sig Start Date End Date Taking? Authorizing Provider  ALPRAZolam  (XANAX ) 0.25 MG tablet Take 1 tablet (0.25 mg total) by mouth 2 (two) times daily as needed for flying in planes 04/09/21     Blood Pressure Monitoring (OMRON 3 SERIES BP MONITOR) DEVI Use as directed. 03/08/23   Sun, Vyvyan, MD  budesonide -formoterol  (SYMBICORT ) 80-4.5 MCG/ACT inhaler Inhale 2 puffs into the lungs as needed up to 12 puffs per day Patient not taking: Reported on 04/09/2023 08/24/22     Calcium -Magnesium -Vitamin D  600-40-500 MG-MG-UNIT TB24 Take 3 tablets by mouth daily.    [provider]  cloNIDine  (CATAPRES  - DOSED IN MG/24 HR) 0.2 mg/24hr patch Place 1 patch (0.2 mg total) onto the skin every 7 (seven) days. 09/01/23     cyclobenzaprine  (FLEXERIL ) 10 MG tablet Take 1 tablet (10 mg total) by mouth 3 (three) times daily as needed for back spasms. 11/27/22     diclofenac  Sodium (VOLTAREN ) 1 % GEL Apply 1 gram application as directed topically 3 (three) times daily to affected joint 06/18/21     diclofenac  Sodium (VOLTAREN ) 1 % GEL Apply as directed to affected joint 3 times daily 03/03/23     diphenhydrAMINE  (BENADRYL ) 25 mg capsule Take 25 mg by mouth every 6 (six) hours as needed.    [provider]  diphenhydrAMINE -zinc  acetate  (BENADRYL  EXTRA STRENGTH) cream Apply 1 application topically 3 (three) times daily as needed for itching (Apply on hands). 12/12/17   [name], MD  EPINEPHrine  0.3 mg/0.3 mL IJ SOAJ injection Inject 0.3 mg into the muscle as directed as needed 01/22/21     esomeprazole  (NEXIUM ) 40 MG capsule Take 1 capsule (40 mg total) by mouth 2 (two) times daily. 07/21/22     famotidine  (PEPCID ) 20 MG tablet Take 1 tablet (20 mg total) by mouth 2 (two) times daily. 12/12/17   [name], MD  furosemide  (LASIX ) 20 MG tablet  02/18/18   [provider]  meclizine  (ANTIVERT ) 25 MG tablet Take 1 tablet (25 mg total) by mouth 3 (three) times daily as needed. 10/29/23     meclizine  (ANTIVERT ) 50 MG tablet Take 1 tablet (50 mg total) by mouth 3 (three) times daily as needed. 06/01/21   [name] LABOR, FNP  metoprolol  tartrate (LOPRESSOR ) 50 MG tablet Take 1 tablet by mouth daily in the morning and 1/2 tablet in the evening 04/22/22     metoprolol  tartrate (LOPRESSOR ) 50 MG tablet Take 1 tablet (50 mg total) by mouth in the morning AND 0.5 tablets (25 mg total) every evening. 06/15/23     nitroGLYCERIN  (NITROSTAT ) 0.4 MG SL tablet Place 1 tablet (0.4 mg total) under the tongue every 5 (five) minutes as needed for chest pain. 10/17/13   Abrol, Nayana,  MD  ondansetron  (ZOFRAN ) 4 MG tablet Take 1 tablet (4 mg total) by mouth every 8 (eight) hours as needed for nausea or vomiting. 06/01/21   [name] A, FNP  Potassium Gluconate 550 MG TABS Take by mouth. Prn with lasix     [provider]  spironolactone  (ALDACTONE ) 100 MG tablet Take 1 tablet (100 mg total) by mouth daily. 04/09/23     Vitamin D , Ergocalciferol , (DRISDOL ) 1.25 MG (50000 UNIT) CAPS capsule Take 1 capsule (50,000 Units total) by mouth once a week. 07/30/23       Allergies: Ace inhibitors, Chlorthalidone, Crab [shellfish allergy], Edarbi [azilsartan], Fish allergy, Ivp dye [iodinated contrast media], Losartan, Nizatidine, Bidil   [isosorb dinitrate-hydralazine ], Amlodipine  besylate, Atorvastatin , Dilaudid [hydromorphone hcl], Evolocumab , Hydrocodone-acetaminophen , and Oxycodone-acetaminophen     Review of Systems  Gastrointestinal:  Positive for blood in stool and diarrhea.  All other systems reviewed and are negative.   Updated Vital Signs BP 103/65   Pulse 78   Temp 98.2 F (36.8 C)   Resp 18   LMP 07/05/2009   SpO2 99%   Physical Exam Vitals and nursing note reviewed. Exam conducted with a chaperone present.  Constitutional:      Appearance: Normal appearance.  HENT:     Head: Normocephalic and atraumatic.     Nose: Nose normal.  Eyes:     Extraocular Movements: Extraocular movements intact.     Conjunctiva/sclera: Conjunctivae normal.     Pupils: Pupils are equal, round, and reactive to light.  Neck:     Comments: No pain to palpation down cervical spine Cardiovascular:     Rate and Rhythm: Normal rate.  Pulmonary:     Effort: Pulmonary effort is normal. No respiratory distress.     Breath sounds: Normal breath sounds.     Comments: Patient denies any shortness of breath. Abdominal:     Tenderness: There is no abdominal tenderness. There is no right CVA tenderness, left CVA tenderness or guarding.  Genitourinary:    Comments: External hemorrhoids noted on rectal exam.  There is no blood noted around the rectum or in the rectal vault.  Good rectal tone on exam and no masses or pain on rectal exam.  Chaperone present for entire exam.  RN Ole Rouleau Musculoskeletal:        General: Normal range of motion.     Cervical back: Normal range of motion.     Comments: No pain to palpation down entire spine.  No paraspinous pain either.  Skin:    General: Skin is warm.     Capillary Refill: Capillary refill takes less than 2 seconds.     Coloration: Skin is not jaundiced.  Neurological:     General: No focal deficit present.     Mental Status: She is alert.  Psychiatric:        Mood and Affect:  Mood normal.        Behavior: Behavior normal.     (all labs ordered are listed, but only abnormal results are displayed) Labs Reviewed  COMPREHENSIVE METABOLIC PANEL WITH GFR - Abnormal; Notable for the following components:      Result Value   Glucose, Bld 144 (*)    Creatinine, Ser 1.58 (*)    GFR, Estimated 36 (*)    All other components within normal limits  CBC - Abnormal; Notable for the following components:   WBC 13.3 (*)    All other components within normal limits  POC OCCULT BLOOD, ED - Normal  POC OCCULT BLOOD, ED    EKG: None  Radiology: No results found.   Procedures   Medications Ordered in the ED - No data to display  69 y.o. female presents to the ED with complaints of rectal bleeding, this involves an extensive number of treatment options, and is a complaint that carries with it a high risk of complications and morbidity.  The differential diagnosis includes lower GI bleed, upper GI bleed, diverticulitis, hemorrhoids, cauda equina (Ddx)  On arrival pt is nontoxic, vitals unremarkable. Exam significant for external hemorrhoids no noted blood in rectal vault and Hemoccult negative.    Lab Tests:  I Ordered, reviewed, and interpreted labs, which included: CMP, CBC, Hemoccult.  Creatinine and GFR baseline for patient.  She has CKD.  Patient has elevated white count with no reported fevers and patient has history of elevated white count on chart review.  ED Course:   Patient sitting comfortably in ED bed in no acute distress nontoxic-appearing.  Patient's hemoglobin is unremarkable and patient was Hemoccult negative.  There is no pain to palpation down entire spine and patient has no saddle paresthesia and good rectal tone on exam.  Patient denies any incontinence.  Patient has not had any recent trauma or injuries cauda equina syndrome is not suspected at this time.  This appears to be an isolated incident because there is no changes in hemoglobin and Hemoccult  negative with no obvious signs of bleeding on exam.  Patient was advised to continue to monitor for any worsening bleeding or prolonged bleeding.  Patient was advised if this does continue to return for further evaluation.  Portions of this note were generated with Scientist, clinical (histocompatibility and immunogenetics). Dictation errors may occur despite best attempts at proofreading.   Final diagnoses:  Rectal bleeding    ED Discharge Orders     None          Myriam Fonda RAMAN, [place] 11/04/23 2243    Bari Roxie HERO, DO 11/05/23 [id number]

## 2023-11-04 NOTE — ED Triage Notes (Signed)
 Pt her POV with reports of having one episode of diarrhea that was BRBPR today. Pt denies abdominal pain, but does state abdomen is gurgling. Not on anticoagulation.

## 2023-11-29 ENCOUNTER — Other Ambulatory Visit (HOSPITAL_COMMUNITY): Payer: Self-pay

## 2023-11-29 DIAGNOSIS — N2581 Secondary hyperparathyroidism of renal origin: Secondary | ICD-10-CM | POA: Diagnosis not present

## 2023-11-29 DIAGNOSIS — I509 Heart failure, unspecified: Secondary | ICD-10-CM | POA: Diagnosis not present

## 2023-11-29 DIAGNOSIS — N1831 Chronic kidney disease, stage 3a: Secondary | ICD-10-CM | POA: Diagnosis not present

## 2023-11-29 DIAGNOSIS — I129 Hypertensive chronic kidney disease with stage 1 through stage 4 chronic kidney disease, or unspecified chronic kidney disease: Secondary | ICD-10-CM | POA: Diagnosis not present

## 2023-11-29 MED ORDER — FUROSEMIDE 20 MG PO TABS
ORAL_TABLET | ORAL | 2 refills | Status: AC
  Filled 2023-11-29: qty 100, 90d supply, fill #0

## 2023-12-10 ENCOUNTER — Other Ambulatory Visit: Payer: Self-pay

## 2023-12-19 DIAGNOSIS — G4733 Obstructive sleep apnea (adult) (pediatric): Secondary | ICD-10-CM | POA: Diagnosis not present

## 2023-12-21 ENCOUNTER — Other Ambulatory Visit (HOSPITAL_COMMUNITY): Payer: Self-pay | Admitting: Nephrology

## 2023-12-21 DIAGNOSIS — I509 Heart failure, unspecified: Secondary | ICD-10-CM

## 2023-12-24 ENCOUNTER — Other Ambulatory Visit: Payer: Self-pay

## 2023-12-24 ENCOUNTER — Other Ambulatory Visit (HOSPITAL_COMMUNITY): Payer: Self-pay

## 2023-12-27 ENCOUNTER — Other Ambulatory Visit (HOSPITAL_COMMUNITY): Payer: Self-pay

## 2023-12-27 MED ORDER — CLONIDINE 0.2 MG/24HR TD PTWK
MEDICATED_PATCH | TRANSDERMAL | 1 refills | Status: AC
  Filled 2023-12-27: qty 12, 84d supply, fill #0

## 2023-12-28 ENCOUNTER — Other Ambulatory Visit: Payer: Self-pay

## 2023-12-29 ENCOUNTER — Ambulatory Visit (HOSPITAL_COMMUNITY)
Admission: RE | Admit: 2023-12-29 | Discharge: 2023-12-29 | Disposition: A | Discharge status: Home / Self Care | Source: Ambulatory Visit | Attending: Nephrology | Admitting: Nephrology

## 2023-12-29 DIAGNOSIS — I110 Hypertensive heart disease with heart failure: Secondary | ICD-10-CM | POA: Diagnosis not present

## 2023-12-29 DIAGNOSIS — I358 Other nonrheumatic aortic valve disorders: Secondary | ICD-10-CM | POA: Insufficient documentation

## 2023-12-29 DIAGNOSIS — E785 Hyperlipidemia, unspecified: Secondary | ICD-10-CM | POA: Insufficient documentation

## 2023-12-29 DIAGNOSIS — I509 Heart failure, unspecified: Secondary | ICD-10-CM | POA: Insufficient documentation

## 2024-01-01 LAB — ECHOCARDIOGRAM COMPLETE
Area-P 1/2: 1.78 cm2
S' Lateral: 2.75 cm

## 2024-02-07 ENCOUNTER — Other Ambulatory Visit (HOSPITAL_COMMUNITY): Payer: Self-pay

## 2024-03-09 ENCOUNTER — Ambulatory Visit (HOSPITAL_BASED_OUTPATIENT_CLINIC_OR_DEPARTMENT_OTHER): Admitting: Cardiovascular Disease

## 2024-04-14 ENCOUNTER — Ambulatory Visit (HOSPITAL_BASED_OUTPATIENT_CLINIC_OR_DEPARTMENT_OTHER): Admitting: Obstetrics & Gynecology
